# Patient Record
Sex: Male | Born: 1960
Health system: Southern US, Community
[De-identification: ages and names within clinical notes are randomized; demographics above are authoritative.]

## PROBLEM LIST (undated history)

## (undated) DIAGNOSIS — I2510 Atherosclerotic heart disease of native coronary artery without angina pectoris: Secondary | ICD-10-CM

## (undated) DIAGNOSIS — E785 Hyperlipidemia, unspecified: Secondary | ICD-10-CM

## (undated) DIAGNOSIS — I10 Essential (primary) hypertension: Secondary | ICD-10-CM

## (undated) DIAGNOSIS — I4891 Unspecified atrial fibrillation: Secondary | ICD-10-CM

## (undated) DIAGNOSIS — T7840XA Allergy, unspecified, initial encounter: Secondary | ICD-10-CM

## (undated) DIAGNOSIS — N189 Chronic kidney disease, unspecified: Secondary | ICD-10-CM

## (undated) DIAGNOSIS — I219 Acute myocardial infarction, unspecified: Secondary | ICD-10-CM

## (undated) DIAGNOSIS — E119 Type 2 diabetes mellitus without complications: Secondary | ICD-10-CM

## (undated) HISTORY — DX: Chronic kidney disease, unspecified: N18.9

## (undated) HISTORY — PX: SHOULDER SURGERY: SHX246

## (undated) HISTORY — DX: Type 2 diabetes mellitus without complications: E11.9

## (undated) HISTORY — DX: Essential (primary) hypertension: I10

## (undated) HISTORY — DX: Hyperlipidemia, unspecified: E78.5

## (undated) HISTORY — DX: Unspecified atrial fibrillation: I48.91

## (undated) HISTORY — PX: KNEE ARTHROSCOPY: SUR90

## (undated) HISTORY — DX: Acute myocardial infarction, unspecified: I21.9

## (undated) HISTORY — DX: Atherosclerotic heart disease of native coronary artery without angina pectoris: I25.10

## (undated) HISTORY — PX: CORONARY STENT PLACEMENT: SHX1402

## (undated) HISTORY — DX: Allergy, unspecified, initial encounter: T78.40XA

---

## 1998-01-31 ENCOUNTER — Emergency Department (HOSPITAL_COMMUNITY): Admission: EM | Admit: 1998-01-31 | Discharge: 1998-01-31 | Payer: Self-pay | Admitting: Emergency Medicine

## 2000-03-09 ENCOUNTER — Encounter: Payer: Self-pay | Admitting: Gastroenterology

## 2000-03-09 ENCOUNTER — Ambulatory Visit (HOSPITAL_COMMUNITY): Admission: RE | Admit: 2000-03-09 | Discharge: 2000-03-09 | Payer: Self-pay | Admitting: Gastroenterology

## 2002-08-15 ENCOUNTER — Ambulatory Visit (HOSPITAL_COMMUNITY): Admission: RE | Admit: 2002-08-15 | Discharge: 2002-08-15 | Payer: Self-pay | Admitting: Orthopedic Surgery

## 2002-08-15 ENCOUNTER — Encounter: Payer: Self-pay | Admitting: Orthopedic Surgery

## 2003-05-10 HISTORY — PX: CARDIAC SURGERY: SHX584

## 2004-02-12 ENCOUNTER — Inpatient Hospital Stay (HOSPITAL_COMMUNITY): Admission: EM | Admit: 2004-02-12 | Discharge: 2004-02-15 | Payer: Self-pay | Admitting: Emergency Medicine

## 2004-03-01 ENCOUNTER — Encounter (HOSPITAL_COMMUNITY): Admission: RE | Admit: 2004-03-01 | Discharge: 2004-05-30 | Payer: Self-pay | Admitting: Cardiology

## 2004-03-19 ENCOUNTER — Ambulatory Visit: Payer: Self-pay | Admitting: Internal Medicine

## 2004-03-24 ENCOUNTER — Ambulatory Visit (HOSPITAL_COMMUNITY): Admission: RE | Admit: 2004-03-24 | Discharge: 2004-03-24 | Payer: Self-pay | Admitting: Orthopedic Surgery

## 2004-04-06 ENCOUNTER — Ambulatory Visit: Payer: Self-pay | Admitting: *Deleted

## 2004-04-26 ENCOUNTER — Ambulatory Visit: Payer: Self-pay | Admitting: Internal Medicine

## 2004-05-31 ENCOUNTER — Encounter (HOSPITAL_COMMUNITY): Admission: RE | Admit: 2004-05-31 | Discharge: 2004-08-29 | Payer: Self-pay | Admitting: Cardiology

## 2004-06-25 ENCOUNTER — Ambulatory Visit: Payer: Self-pay | Admitting: Internal Medicine

## 2004-06-29 ENCOUNTER — Ambulatory Visit: Payer: Self-pay | Admitting: Internal Medicine

## 2004-06-29 ENCOUNTER — Inpatient Hospital Stay (HOSPITAL_COMMUNITY): Admission: EM | Admit: 2004-06-29 | Discharge: 2004-07-01 | Payer: Self-pay | Admitting: Emergency Medicine

## 2004-06-29 ENCOUNTER — Ambulatory Visit: Payer: Self-pay | Admitting: Cardiology

## 2005-09-15 ENCOUNTER — Ambulatory Visit: Payer: Self-pay | Admitting: Internal Medicine

## 2006-02-13 ENCOUNTER — Encounter: Admission: RE | Admit: 2006-02-13 | Discharge: 2006-02-13 | Payer: Self-pay | Admitting: Cardiovascular Disease

## 2006-02-16 ENCOUNTER — Ambulatory Visit (HOSPITAL_COMMUNITY): Admission: RE | Admit: 2006-02-16 | Discharge: 2006-02-16 | Payer: Self-pay | Admitting: Cardiovascular Disease

## 2006-02-27 ENCOUNTER — Ambulatory Visit: Payer: Self-pay | Admitting: Internal Medicine

## 2006-04-14 ENCOUNTER — Ambulatory Visit: Payer: Self-pay | Admitting: Internal Medicine

## 2006-06-28 ENCOUNTER — Ambulatory Visit: Payer: Self-pay | Admitting: Family Medicine

## 2007-02-09 ENCOUNTER — Telehealth: Payer: Self-pay | Admitting: Internal Medicine

## 2007-03-05 ENCOUNTER — Encounter: Payer: Self-pay | Admitting: Internal Medicine

## 2007-03-08 ENCOUNTER — Encounter: Payer: Self-pay | Admitting: Internal Medicine

## 2008-03-03 ENCOUNTER — Encounter: Payer: Self-pay | Admitting: Internal Medicine

## 2008-03-26 ENCOUNTER — Encounter: Payer: Self-pay | Admitting: Internal Medicine

## 2009-02-05 ENCOUNTER — Telehealth: Payer: Self-pay | Admitting: Internal Medicine

## 2009-02-06 ENCOUNTER — Ambulatory Visit: Payer: Self-pay | Admitting: Family Medicine

## 2009-02-06 DIAGNOSIS — R1084 Generalized abdominal pain: Secondary | ICD-10-CM | POA: Insufficient documentation

## 2009-02-06 DIAGNOSIS — I2510 Atherosclerotic heart disease of native coronary artery without angina pectoris: Secondary | ICD-10-CM | POA: Insufficient documentation

## 2009-02-13 ENCOUNTER — Encounter: Admission: RE | Admit: 2009-02-13 | Discharge: 2009-02-13 | Payer: Self-pay | Admitting: Family Medicine

## 2009-07-23 ENCOUNTER — Telehealth: Payer: Self-pay | Admitting: Internal Medicine

## 2009-08-26 ENCOUNTER — Encounter: Payer: Self-pay | Admitting: Internal Medicine

## 2009-10-27 ENCOUNTER — Encounter: Payer: Self-pay | Admitting: Internal Medicine

## 2010-06-08 NOTE — Letter (Signed)
Summary: [facility]  [facility]   Imported By: Maryln Gottron 11/10/2009 10:37:41  _____________________________________________________________________  External Attachment:    Type:   Image     Comment:   External Document

## 2010-06-08 NOTE — Progress Notes (Signed)
Summary: Pt req labs needed for Sarcoidosis  Phone Note Call from Patient Call back at Home Phone [phone]   Caller: spouse-Ellen Reason for Call: Acute Illness Summary of Call: Pts wife called and said that pt has Sarcoidosis and hasnt had labs done in a long time. Pt would like Dr. Lovell Sheehan to order labs.  Initial call taken by: Lucy Antigua,  July 23, 2009 12:49 PM  Follow-up for Phone Call        needs ov a nd he will decide what to order then Follow-up by: Willy Eddy, LPN,  July 23, 2009 1:20 PM  Additional Follow-up for Phone Call Additional follow up Details #1::        Phone Call Completed---Contacted pts wife and appt was made for 08/20/2009 for OV w/ Dr Lovell Sheehan (Okay per Dr Lovell Sheehan).Marland KitchenMarland KitchenPt will come in fasting for potential labwrk. Additional Follow-up by: Debbra Riding,  July 23, 2009 2:13 PM

## 2010-09-24 NOTE — Op Note (Signed)
NAME:  Michael Livingston, Michael [name]:  [id number]   MEDICAL RECORD NO.:  [id number]          PATIENT TYPE:  AMB   LOCATION:  DAY                          FACILITY:  [facility]   PHYSICIAN:  Ollen Gross, M.D.    DATE OF BIRTH:  [birth year]   DATE OF PROCEDURE:  03/24/2004  DATE OF DISCHARGE:                                 OPERATIVE REPORT   PREOPERATIVE DIAGNOSIS:  Right knee medial meniscal tear.   POSTOPERATIVE DIAGNOSIS:  Right knee medial meniscal tear.   PROCEDURE:  Right knee arthroscopy with meniscal debridement.   SURGEON:  Gus Rankin. Aluisio, M.D.   ANESTHESIA:  Local with MAC.   ESTIMATED BLOOD LOSS:  Minimal.   DRAIN:  None.   COMPLICATIONS:  None.   CONDITION:  Stable to recovery.   BRIEF CLINICAL NOTE:  [name] is a 50 year old male, who sustained an on-the-  job injury to his right knee back in September.  He has sustained a medial  meniscal tear and has continued pain and mechanical symptoms.  He presents  now for arthroscopy and debridement.   PROCEDURE IN DETAIL:  After the successful administration of local with MAC  anesthetic, a tourniquet is placed high on the right thigh and right lower  extremity prepped and draped in the usual sterile fashion.  I then injected  the portal sites with 1% Xylocaine with epinephrine, approximately 15 mL  total.  An incision is made for the superomedial and inferolateral portals  and inflow cannula passed superomedial and camera passed inferolateral.  Arthroscopic visualization proceeds.   The undersurface of the patella and the trochlea both look normal.  Medial  and lateral gutters are visualized, and there are no loose bodies.  Flexion  and valgus force is applied to the knee, and the medial compartment is  entered.  Chondral surface looks normal, but he has sustained a bucket-  handle tear as well as a vertical tear of the medial meniscus.  The  posterior horn is essentially shredded all the way over to the body.   This  is not degenerative in nature and is more a traumatic-type tear.  Spinal  needle was used to localize the inferomedial portal, small incision made,  dilator placed, and then the baskets and shaver are used to debride the tear  back to a stable base.  The chondral surfaces remained unaffected.  Intercondylar notch is visualized.  ACL appears and probes normally.  The  lateral compartment is entered, and that is normal.  We then removed the  arthroscopic equipment from the inferior portals  which are closed with interrupted 4-0 nylon.  Then 20 mL of 0.25% Marcaine  with epinephrine are injected through the inflow cannula.  Then that cannula  was removed and that portal closed with nylon.  A bulky sterile dressing is  applied, and he is awakened and transported to recovery in stable condition.     Drenda Freeze   FA/MEDQ  D:  03/24/2004  T:  03/24/2004  Job:  [id number]

## 2010-09-24 NOTE — Discharge Summary (Signed)
[name], KENTNER NO.:  [id number]   MEDICAL RECORD NO.:  [id number]          PATIENT TYPE:  INP   LOCATION:  4737                         FACILITY:  [facility]   PHYSICIAN:  [place] Bing, M.D.  DATE OF BIRTH:  [birth year]   DATE OF ADMISSION:  02/12/2004  DATE OF DISCHARGE:  02/15/2004                           DISCHARGE SUMMARY - REFERRING   HISTORY OF PRESENT ILLNESS:  [name] is a 50 year old white male who was  referred for exertional chest discomfort and shortness of breath with  radiation to the neck and shoulder. An Adenosine Cardiolite was  performed  on February 12, 2004 and showed septal apical ischemia and he was felt to be  high risk. Thus, he was admitted for cardiac catheterization. His history is  notable for remote tobacco use, 10 pack year history, history of  sarcoidosis, status post lung biopsy, finger surgery, left knee surgery. He  is currently undergoing evaluation for further knee surgery by Dr. Lequita Halt.   LABORATORY DATA:  Chest x-ray performed on February 12, 2004 did not show any  active disease.   Admission hemoglobin and hematocrit was 16.0 and 44.8, normal indices.  Platelets 266,000. White blood cell count 7.4. Prior to discharge,  hemoglobin and hematocrit was 15.3 and 42.9. Platelets 269,000. White blood  cell count 9.0. Admission PT was 12.3. PTT 29, sodium 135, potassium 4.1,  BUN 12, creatinine 0.9, glucose 84, normal liver function studies. Last  chemistry was on February 14, 2004 which was 134, potassium 3.9, BUN 8,  creatinine 0.9. Initial CK total MB's were negative x2. Post catheterization  total CK was 224 with an MB of 29.8 and a relative index of 13.3. On the  morning of the 9th, total CK was 154 with MB of 12.3, relative index 8.0,  and a troponin of 2.78. TSH was 1.676 on admission. Fasting lipids on the  8th showed a total cholesterol of 149, triglycerides 167, HDL 32, LDL 84.   EKG on the 6th showed normal sinus  rhythm, normal axis, non-specific STT  wave changes. Subsequent EKG's remained the same. However post  catheterization, he developed T wave inversion V2 through V6.   HOSPITAL COURSE:  Mr. Yamada was admitted to 3700. He was placed on  intravenous heparin as well as aspirin and a beta blocker and a statin.  Catheterization was performed by Dr. Juanda Chance. This revealed a proximal 95/80%  left anterior descending lesion and a 40% mid circumflex lesion. Ejection  fraction was 60%. Utilizing 2 Monorail stents, his left anterior descending  lesions were reduced to less than 0 percent. During the procedure, he did  have some chest discomfort. Dr. Juanda Chance noted that his enzyme bump is  probably transient from the left anterior descending intervention. He felt  that if the MB was greater than 2 times normal, we should keep the patient  until Sunday. If less than 2 times normal, probably okay to discharge  Saturday. He described that the patient should be on Plavix for 4 weeks and  then could be discontinued for knee surgery and then resumed post knee  surgery. He will followup with Dr. Tenny Craw in 2 weeks. Dr. Juanda Chance recommended a  followup Cardiolite in approximately 6 months. By February 14, 2004, Dr.  Antoine Poche reviewed and with the patient's enzyme bump, he felt that he should  remain until Sunday. During this interval, he was seen by cardiac  rehabilitation who assisted with ambulation and education. By February 15, 2004 after Dr. Marvel Plan review, it was felt that the patient could be  discharged home.   DISCHARGE DIAGNOSES:  1.  Unstable angina with positive Adenosine Cardiolite. Catheterization as      previously described. Status post Monorail stenting x2 to the proximal      left anterior descending. Post catheterization CK, MB, and troponin      release.  2.  Hyperlipidemia.  3.  Remote tobacco history previously.   DISPOSITION:  He is discharged home.   DISCHARGE MEDICATIONS:  He received  prescriptions for new medications in  regards to:  1.  Aspirin 325 mg p.o. q.d.  2.  Toprol XL 50 mg q.d.  3.  Lipitor 80 mg q.h.s.  4.  Plavix 75 mg q.d. for 4 weeks. After 4 weeks of Plavix, he was asked to      hold for 1 week, have his knee surgery, and then resume his Plavix post      surgery.  5.  Nitroglycerin 0.4 mg as needed.  6.  He was instructed that he may continue is Protonix 40 mg q.d.   ACTIVITY:  He was advised no driving for 2 days or strenuous activities for  2 weeks.   DIET:  Low-salt, low-fat, low-cholesterol diet.   SPECIAL INSTRUCTIONS:  If he has any problems with the catheterization site,  he was asked to call.   FOLLOW UP:  With Dr. Lovell Sheehan as needed as well as Dr. Lequita Halt. He will see  Dr. Alexia Freestone. on February 27, 2004 at noon. He will need fasting lipids and  liver function studies in approximately 6 to 8 weeks, since Lipitor was  initiated. At the time of followup, cardiac rehabilitation also needs to be  arranged for the patient.      Emil   EW/MEDQ  D:  02/15/2004  T:  02/15/2004  Job:  [id number]   cc:   Pricilla Riffle, M.D.   Dalia Heading, M.D.  [address] [place]  [place]  [place] [address]  Fax: [phone]   Ollen Gross, M.D.  [facility]  [address]  [address]  [place]  [place] [address]  Fax: [phone]

## 2010-09-24 NOTE — Assessment & Plan Note (Signed)
[facility]                                 ON-CALL NOTE   CHRISTOFFER, CURRIER                        MRN:          [id number]  DATE:06/25/2006                            DOB:          Aug 22, 1960    PHONE:  [phone]   PRIMARY CARE PHYSICIAN:  Dr. Lovell Sheehan.   SUBJECTIVE:  The patient has been having a fever of 101 to 103 since  Friday evening.  He has sudden onset of body aches, cough, congestion,  runny nose and headache.  He has been using Tylenol and ibuprofen for  fever.  He has a history of a heart stent and sarcoid.  He is concerned  about how best to manage the fever.  He temperature now is 100.8.   ASSESSMENT AND PLAN:  His description of symptoms sounds very much like  influenza.  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  I discussed continual use of ibuprofen 400 mg q.4h p.r.n. fever, as well  as keeping well hydrated and resting.  I will call in a prescription for  Tamiflu 75 mg p.o. b.i.d. x5 days, zero refills.  The patient states  that if he does not feel better he will follow up with his primary care  doctor.  In addition, he was told that if he has shortness of breath to  go to the emergency room.     Kerby Nora, MD  Electronically Signed    AB/MedQ  DD: 06/25/2006  DT: 06/25/2006  Job #: [id number]

## 2010-09-24 NOTE — Discharge Summary (Signed)
NAME:  Michael Livingston, Michael [name]:  [id number]   MEDICAL RECORD NO.:  [id number]            PATIENT TYPE:   LOCATION:                                 FACILITY:   PHYSICIAN:  Rene Paci, M.D. [facility]  DATE OF BIRTH:   DATE OF ADMISSION:  06/29/2004  DATE OF DISCHARGE:                                 DISCHARGE SUMMARY   DISCHARGE DIAGNOSES:  1.  Chest pain, atypical, ruled out myocardial infarction, enzymes negative,      catheterization negative.  2.  History of coronary artery disease.  3.  History of sarcoidosis.  4.  History of dyslipidemia.   DISCHARGE MEDICATIONS:  As prior to admission without change and include:   1.  Vytorin 10/40 mg.  2.  Aspirin.  3.  Plavix 75 mg.  4.  Toprol XL 25 mg daily.   CONSULTATIONS:  [facility].   PROCEDURES:  Cardiac catheterization February 22, with 30% blockage  secondary to stent pinching, no change from October 2005.  Normal LVEF.   CONDITION ON DISCHARGE:  Medically stable.   DISPOSITION:  Home.   HOSPITAL COURSE BY PROBLEM:  The patient is a 50 year old gentleman with  previous history of coronary disease and stents, who presents to the  emergency room the day of admission with complaints of chest pain and no  other atypical features.  He was seen in consultation by cardiology.  Cardiac enzymes were negative.  It was felt that he deserved a  catheterization due to the possibility of restenosis.  No recurrent chest  pain, and the catheterization was performed February 22 without abnormality.  He was told to continue previous medications with stress on compliance and  no other recommendations at this time.      Rene Paci, M.D. [facility]  Electronically Signed     VL/MEDQ  D:  03/01/2005  T:  03/02/2005  Job:  [id number]

## 2010-09-24 NOTE — H&P (Signed)
Michael Livingston, [name]:  [id number]   MEDICAL RECORD NO.:  [id number]          PATIENT TYPE:  INP   LOCATION:  3730                         FACILITY:  [facility]   PHYSICIAN:  Pricilla Riffle, M.D.    DATE OF BIRTH:  1960/08/26   DATE OF ADMISSION:  02/12/2004  DATE OF DISCHARGE:                                HISTORY & PHYSICAL   PRIMARY CARE PHYSICIAN:  Dr. Lovell Sheehan.   CHIEF COMPLAINT:  Chest discomfort; abnormal Cardiolite.   HISTORY OF PRESENT ILLNESS:  Michael Livingston is a very pleasant 50 year old  married white male with no known history of coronary artery disease who  presents to the office today for follow up after undergoing Cardiolite study  this morning. His Cardiolite returned showing septal and apical ischemia and  was read on the high risk study by Dr. Jens Som. The patient notes a history  of exertional chest discomfort over the past two months. With just minimal  exertional he will have a burning sensation in his chest that goes up into  his neck and across his shoulders. He notes significant shortness of breath  with just minimal exertion. He has had some presyncope, but denies any  syncope. He denies any paroxysmal nocturnal dyspnea or orthopnea. He denies  any nausea or diaphoresis. He has had some symptoms at rest from time to  time. We plan to admit him directly to [facility] today and set him  up for cardiac catheterization tomorrow.   PAST MEDICAL HISTORY:  Significant for sarcoidosis and he is status post  lung biopsy. He also had cholesterol checked at a screening fair recently  that showed a total cholesterol of 200, triglycerides in the 500 range.  However, the patient had eaten about four hours prior to this. He denies any  history of diabetes mellitus, hypertension, or thyroid disease. He has a  history of left knee surgery and a history of finger surgery.   MEDICATIONS:  He is currently on Protonix daily p.r.n. This was given to  him  by his primary care physician recently for the above symptoms.  Of note he  has had no relief with this.   ALLERGIES:  SULFA and ALEVE.   SOCIAL HISTORY:  The patient lives in [place]. 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 He  quit smoking in 1989 after 10 years. He denies any alcohol or drug abuse.   FAMILY HISTORY:  Significant for coronary artery disease in his maternal  grandfather. He had a myocardial infarction in his 21s. His paternal  grandfather has some type of heart trouble and his grandmother had valvular  disease.   REVIEW OF SYSTEMS:  Please see the HPI. Denies any melena, hematochezia,  hematuria, dysuria, fever, chills, or cough. He does have significant right  knee pain. He tore his meniscus recently and is awaiting clearance for  surgery.   PHYSICAL EXAMINATION:  GENERAL: He is well-developed, well-nourished, and in  no acute distress.  VITAL SIGNS: Blood pressure is 130/74, pulse 65, respirations 15, weight 230  pounds.  HEENT: Head is normocephalic and atraumatic. PERRLA. EOMI. Sclerae  clear.  NECK: Supple without lymphadenopathy, thyromegaly, bruits, or JVD. No  lymphadenopathy.  CARDIAC: Normal S1 and S2. Regular rate and rhythm without murmurs.  LUNGS: Clear to auscultation bilaterally.  ABDOMEN: Soft, nontender with normoactive bowel sounds. No  hepatosplenomegaly.  EXTREMITIES: Without clubbing, cyanosis, or edema.  MUSCULOSKELETAL: Without joint deformity.  NEUROLOGIC: Alert and oriented times three. Nonfocal.  VASCULAR: No carotid bruits bilaterally. Femoral pulses are 2+ bilaterally  without bruits.   Electrocardiogram reveals a sinus rhythm with heart rate of 61, nonspecific  ST-T wave changes.   IMPRESSION:  1.  Unstable angina pectoris with markedly abnormal Cardiolite as noted      above.  2.  Ex-smoker.  3.  Remote family history of coronary artery disease.  4.  History of sarcoidosis.   PLAN:  The patient is also interviewed and examined by Dr.  Tenny Craw today. We  plan to admit him directly to [facility] tonight and place him on  IV heparin, aspirin, Lopressor 12.5 mg b.i.d., and Lipitor 80 mg daily. If  he has recurrent pain tonight will place him on intravenous nitroglycerin.  Will plan cardiac catheterization tomorrow. The risks and benefits have been  discussed with the patient and his wife and they both agree to proceed.       SW/MEDQ  D:  02/12/2004  T:  02/12/2004  Job:  [id number]   cc:   Stacie Glaze, M.D. [facility]

## 2010-09-24 NOTE — Cardiovascular Report (Signed)
NAME:  Michael Livingston, Michael [name]:  [id number]   MEDICAL RECORD NO.:  [id number]          PATIENT TYPE:  INP   LOCATION:  4732                         FACILITY:  [facility]   PHYSICIAN:  Jonelle Sidle, M.D. LHCDATE OF BIRTH:  [birth year]   DATE OF PROCEDURE:  DATE OF DISCHARGE:                              CARDIAC CATHETERIZATION   PRIMARY CARE PHYSICIAN:  Dr. Darryll Capers.   CARDIOLOGIST:  Dr. Dietrich Pates.   INDICATION:  Michael Livingston is a 50 year old male with a history of coronary  artery disease status post Tandem Bare Metal stent placement to the proximal  and mid left anterior descending by Dr. Juanda Chance back in October 2005.  He has  been maintained on Plavix since that time except for a brief perioperative  period during knee surgery in November.  He is now admitted to the hospital  with complaints of shortness of breath that have been more noted over the  last few weeks, as well as recent brief episode of chest pain somewhat  reminiscent of the pain he experienced on the cardiac catheterization table  during intervention.  In reviewing the patients cardiac catheterization  report, he did also have some spasm noted at that time in the left anterior  descending.  He referred now for repeat coronary angiography to assess  predominantly stent patency and rule out presence of new obstructive  atherosclerosis.   PROCEDURES PERFORMED:  1.  Left heart catheterization.  2.  Selective coronary angiography.  3.  Left ventriculography.   ACCESS AND EQUIPMENT:  The area about the right femoral artery was  anesthetized with 1% lidocaine and a 6 French sheath was placed in the right  femoral artery via the modified Seldinger technique.  Standard preformed 6  [place] and JR4 catheters were used for selective coronary angiography  and an angled pigtail catheter was used for left heart catheterization and  left ventriculography.  All exchanges were made over a wire and the  patient  tolerated the procedure well without immediate complications.   HEMODYNAMICS:  Left ventricle 131/41 mmHg, aorta 126/77 mmHg.   ANGIOGRAPHIC FINDINGS:  1.  Left main coronary artery is free of significant flow limiting coronary      atherosclerosis.  2.  The left anterior descending is a medium caliber vessel.  Overlapping      stents are noted in the proximal to mid vessel segment.  These stents      are patent with approximately 30% stenosis at the proximal portion of      the stented site.  The first diagonal and septal perforator are      pinched at their ostium most likely secondary to stent placement, but      these areas are stable compared to post intervention images from October      2005.  Flow is TIMI-3 in the vessel.  3.  The circumflex coronary artery is large and dominant with a 40% mid      vessel stenosis that is stable compared to previous angiography films      from October 2005.  4.  The right coronary artery is a medium caliber nondominant vessel without      any significant flow limiting coronary atherosclerosis.   Left ventriculography was performed in the RAO projection and revealed an  ejection fraction of approximately 65% with no significant mitral  regurgitation or wall motion abnormalities.   DIAGNOSES:  1.  Mild coronary atherosclerosis including a stable 40% mid circumflex      stenosis, and a patent proximal to mid left anterior descending stent      sites.  There is 20-30% end-stent restenosis noted proximally, although      the vessel does taper in this area in fairly diffuse fashion.  The      appearance of the ostia of the first diagonal and septal perforators      crossed by the stents are stable compared to previous angiography films      from October 2005.  2.  Left ventricular ejection fraction of approximately 65% with no mitral      regurgitation, no wall motion abnormalities, and normal left ventricular      end-diastolic pressure  of 14 mmHg.   RECOMMENDATIONS:  At this point we will continue aggressive risk factor  modification and cardiology followup.      SGM/MEDQ  D:  06/30/2004  T:  06/30/2004  Job:  [id number]   cc:   Stacie Glaze, M.D. [facility]   Pricilla Riffle, M.D.

## 2010-09-24 NOTE — Cardiovascular Report (Signed)
Michael Livingston, EADS NO.:  [id number]   MEDICAL RECORD NO.:  [id number]          PATIENT TYPE:  INP   LOCATION:  2905                         FACILITY:  [facility]   PHYSICIAN:  Charlies Constable, M.D. [facility] DATE OF BIRTH:  March 11, 1961   DATE OF PROCEDURE:  02/13/2004  DATE OF DISCHARGE:                              CARDIAC CATHETERIZATION   CLINICAL HISTORY:  Michael Livingston is 50 years old and has no prior history of  known heart disease.  He also has had no history of diabetes or  hypertension.  He is having severe problems with his knee and will require  knee surgery in the next several weeks.  He works for [employer].  Dr.  Lovell Sheehan ordered a Cardiolite scan.  This was markedly positive for anterior  ischemia, so he was seen in the office yesterday by Dr. Lucina Mellow and admitted to  the hospital for catheterization today.   PROCEDURE:  The procedure was performed via the right femoral artery using  arterial sheath and 6 French preformed coronary catheters. A front wall  arterial puncture was performed and Omnipaque contrast was used.  After  completion of the diagnostic study, made decision to proceed with  intervention on the complex LAD lesion which extended across a diagonal  branch.  The ostium of the diagonal branch had only minimal disease, but  there was marked plaque in the LAD adjacent to the diagonal branch.  For  this reason, we felt the best way to spare the diagonal branch was to  perform directional atherectomy of the LAD.  The patient had a strong need  to have knee surgery so he can back to work in the near future, so we  elected to put bare metal stents in rather than drug-eluting stents.   The patient was given Angiomax bolus and infusion was given 300 mg of  Plavix.  We used a JL-4, 8 [place] Guidant guiding catheter and the [employer]  We crossed the lesion in the proximal LAD without too much  difficulty.  We initially predilated with a 2.0 x  20-mm Voyager balloon  performing one inflation of 8 atmospheres for 30 seconds.  We then passed a  3.0/3.4 cutter to the proximal LAD at the level of the diagonal branch and  performed five cuts and 5-10 PSI.  The cutter was then removed and the  patient developed slow flow down the LAD with chest pain, so we elected to  remove the cutter entirely and go back in with the balloon.  By the time we  were back in with the balloon he had completely occluded LAD and we had  difficulty re-establishing flow.  We performed multiple inflations with a 2-  0 balloon and this was unsuccessful.  We then used a 2.5 balloon and still  unable to re-establish flow.  We then put a 2.5 x 20-mm perfusion balloon  down the vessel and we were able to document that there was distal flow.  Out thinking was that he probably had a dissection extending past the  initial lesion and causing obstruction  and also had intense spasm.  We then  elected to stent the proximal LAD.  We used a 3.0 x 32-mm Liberte and  deployed this with one inflation of 12 atmospheres for 30 seconds.  There  was residual disease distal to the distal edge of the stent although the  first stent did establish good distal flow.  For this reason, we placed a  second 3.0 x 60-mm Liberte stent just overlapping the first stent and distal  to the first stent and deployed this with one inflation of 12 atmospheres  for 30 seconds.  We  post dilated both stents with a 3.25 x 20-mm Quantum  Maverick performing three inflations up to 16 atmospheres for 30 seconds.  There was some residual what appeared to be spasm in the distal vessel that  did not respond to injections of nitroglycerin and Verapamil through the  guiding catheter, so we passed the 2.0 over the wire balloon and removed the  wire and performed direct injections of nitroglycerin and Verapamil.  We  also dilated this area with 2.0 balloon with one inflation of 8 atmospheres  for 30 seconds.   With these measures, the area improved and he had brisk  flow distally although there still was some moderate narrowing in this  region.  It appeared smooth like spasm although we had some concern there  might be a dissecting hematoma.  The final shots looked much better and we  felt that it was most likely related to spasm.   RESULTS:  Left main coronary artery:  The left main coronary was free of  significant disease.   Left anterior descending artery:  The left anterior descending artery gave  rise to two septal perforators and diagonal branch.  There was 95% stenosis  just at the first diagonal branch and there was a second 80% stenosis  downstream near the mid portion of the vessel.  Distal vessel appeared to be  free of major obstruction.   Circumflex artery:  The circumflex artery gave rise to a large ramus, small  marginal, posterior lateral and posterior descending branch.  There was 40%  narrowing in the mid vessel.  This was a dominant vessel.   Right coronary artery:  The right coronary artery was a nondominant vessel  that supplied two right ventricular branches.  These were free of  significant disease.   LEFT VENTRICULOGRAM:  The left ventriculogram performed in the RAO  projection showed good wall motion with no areas of hypokinesis.  The  estimated ejection fraction was 60%.   Following placement of tandem overlying stents in the proximal to mid, left  anterior descending, the 95 and 80% stenoses improved to 0% stenosis.  There  was residual 50% narrowing downstream.   CONCLUSIONS:  1.  Coronary artery disease with 95% proximal and 80% mid stenosis in the      left anterior descending artery, 40% narrowing in the mid circumflex      artery which was dominant, and no significant obstruction of the right      coronary artery with normal left ventricular function.  2.  Successful percutaneous coronary intervention of the lesion in the     proximal left anterior  descending using VCA and tandem overlying bare      metal stents with improvement in stent renarrowing from 95 and 80% to      0%.   DISPOSITION:  The patient was returned to the postangioplasty unit for  further observation.  He did have temporary occlusion of the LAD and he may  have had a small periprocedural myocardial infarction and will await enzymes  to see if this is the case.  He had some residual narrowing distally which  appears to be spasm, but it does not appear to be flow-limiting.  Will plan  an early stress test to make sure that this is not a problem.       BB/MEDQ  D:  02/13/2004  T:  02/13/2004  Job:  [id number]   cc:   Stacie Glaze, M.D. [facility]

## 2010-09-24 NOTE — H&P (Signed)
NAME:  Michael Livingston, Michael [name]:  [id number]   MEDICAL RECORD NO.:  [id number]          PATIENT TYPE:  INP   LOCATION:  3730                         FACILITY:  [facility]   PHYSICIAN:  Pricilla Riffle, M.D.    DATE OF BIRTH:  [birth year]   DATE OF ADMISSION:  02/12/2004  DATE OF DISCHARGE:                                HISTORY & PHYSICAL   ADDENDUM:  Upon further interview the patient does note a questionable history of  shrimp allergy. He can eat shrimp, but he does have an unusual feeling when  he eats it. To air on the safe side, we are going to give him Zantac 150 mg  p.o. tonight at bedtime and will also give him Benadryl IV on call at the  cath lab. Dr. Tenny Craw will be further in touch with the intervening doctor  tomorrow to see if he thinks preprocedure steroids should be utilized.       SW/MEDQ  D:  02/12/2004  T:  02/12/2004  Job:  [id number]   cc:   Stacie Glaze, M.D. [facility]

## 2010-09-24 NOTE — Consult Note (Signed)
NAME:  Michael Livingston, ASENCIO NO.:  [id number]   MEDICAL RECORD NO.:  [id number]          PATIENT TYPE:  INP   LOCATION:  1831                         FACILITY:  [facility]   PHYSICIAN:  Rollene Rotunda, M.D.   DATE OF BIRTH:  Mar 27, 1961   DATE OF CONSULTATION:  06/29/2004  DATE OF DISCHARGE:                                   CONSULTATION   PRIMARY CARDIOLOGIST:  Dr. Dietrich Pates.   PRIMARY CARE PHYSICIAN:  Dr. Darryll Capers.   PULMONARY:  Dr. Maple Hudson.   CHIEF COMPLAINT:  Chest pain.   We were asked to consult on this patient by Dr. Felicity Coyer. The patient has  known history of coronary artery disease. As of today, he was driving to  work this morning, experienced sudden onset of midsternal, sharp pressure  lasting for seconds. It went away, then it was followed by a hot flash. The  patient states his teeth and mouth felt very strange, almost like an ache.  He denied any dizziness or denied any nausea. He states his face felt very  funny. He pulled off the road and then decided to come to the hospital. He  presented to [facility] emergency room pain free. The patient states since  his cardiac catheterization - actually, the last few weeks - he has noticed  he has become more dyspneic on exertion. However, the patient has also  recently returned to work. Initially after catheterization he states he was  doing cardiac rehab without any problems. The last few weeks his energy  level has diminished, especially at work, although he does a lot of heavy  lifting at [employer].   ALLERGIES:  Questionable allergy to [facility] The patient states when he eats  shrimp his face feels very full and flushed. Also allergy to SULFA and  CODEINE makes him very sick on his stomach and ALEVE causes angioedema.   PAST MEDICAL HISTORY:  Cardiac catheterization in October 2005. Prior to  catheterization the patient did not experience any chest pain. He did,  however, experience a lot of dysphagia, burning in  his esophagus, and some  midsternal burning. What prompted him to have the catheterization, he needed  knee surgery and went for a stress test that turned out to be highly  abnormal, proceeded with cardiac catheterization by Dr. Juanda Chance in October  2005. At that time, the LAD was found to have 95% stenosis just at the first  diagonal branch and there was a second 80% stenosis downstream near the mid  portion of the vessel. The distal vessel appeared to be free of major  obstruction. The circumflex artery was also found to have a 40% narrowing in  the mid vessel.  EF at that time was estimated to be 60%. Good wall motion  with no areas of hypokinesis. The patient had successful stenting with bare  metal stents x2 to the LAD. However, the patient returned to the post  angioplasty unit for further observation. He did have a temporary occlusion  of the LAD and may have had a small periprocedural myocardial infarction at  that time. He  had some residual narrowing distally which appeared to be a  spasm but did not appear to be flow limiting. The plan was for the patient  to follow up in few months with a stress Cardiolite for reevaluation. This  was scheduled for this April at our office.   Other medical history includes dyslipidemia, sarcoidosis - the patient  states this was diagnosed by a chest x-ray for work. He followed up with Dr.  Maple Hudson, was placed on steroids for a year, and has had no further  complications. He states he does have some scarring to his lungs as a  result. Also, history of esophageal stricture. The patient states he had an  attempted dilatation at one time but that was not successful procedure due  to the patient's fighting the medical staff and unable to complete  procedure. Status post right knee surgery in November 2005.   MEDICATIONS:  1.  Toprol-XL 25 daily.  2.  Plavix 75 daily.  3.  Aspirin 81.  4.  Vytorin 10/40.  5.  Nitroglycerin p.r.n.   SOCIAL HISTORY:   The patient lives in [place] with his wife. He works for  [employer]. He has two children alive and well. He is an ex-tobacco user many years  ago. Denies any ETOH use. He does a lot of heavy lifting and walking for his  job with [employer]. He tries to follow a heart smart diet. He denies any drug or  herbal medication use. His mother is alive with type 2 diabetes and  hypertension. Father is alive with type 2 diabetes. He had bypass in his  early 15s. The patient's paternal grandmother deceased at age 50 due to  cardiac complications. The patient has two sisters alive and well.   REVIEW OF SYSTEMS:  Positive for chest pain, shortness of breath, dyspnea on  exertion, pain in his knees bilateral, dysphagia as stated above, and GERD  symptoms. All other systems negative.   PHYSICAL EXAMINATION:  VITAL SIGNS:  Temperature 97, pulse 68, respirations  18, blood pressure 140/80. He is saturating 99% on room air.  GENERAL:  He is alert and oriented, no acute distress, very pleasant and  cooperative.  NECK:  Supple without lymphadenopathy. Negative for TMJ, bruit, or JVD.  CARDIOVASCULAR:  Heart rate regular rate and rhythm, S1 and S2. Pulses 2+  and equal.  LUNGS:  Clear to auscultation bilateral.  ABDOMEN:  Soft, nontender, positive bowel sounds.  EXTREMITIES:  No clubbing, cyanosis, or edema.  BACK:  No spine or CVA tenderness.  NEUROLOGIC:  He is alert and oriented x3. Cranial nerves II-XII.   Chest x-ray pending. EKG showing a rate of 63, sinus rhythm, with a PR 175,  QRS 104, QTC 397.   LABORATORY WORK:  Initial lab work showing a white blood cell count of 5.5;  hemoglobin 15.8; hematocrit 44.8; platelets 234,000. Initial cardiac  enzymes:  Troponin less than 0.01. D-dimer less than 0.22.   Dr. Angelina Sheriff in to examine the patient. After examination of this patient, Dr. Antoine Poche spoke with the patient concerning the benefits of a  catheterization versus Cardiolite. Dr. Antoine Poche felt that at  this point a  cardiac catheterization would be the appropriate diagnostic measure, highly  suspicious for recurrent stenosis in overlapping bare metal stents. High  pretest probability of restenosis. The patient understands risks and  benefits and agrees with plan to proceed with cardiac catheterization  instead of a stress test.   IMPRESSION:  1.  Chest pain,  electrocardiogram without ischemic changes, enzymes pending.  2.  History of coronary artery disease status post catheterization with      stents x2 to left anterior descending coronary artery.  3.  History of sarcoidosis, stable; scarring in lungs per the patient.   We agree with the plan to admit the patient to telemetry, cycle cardiac  enzymes, continue his medications from home. Also, to dose the patient with  Lovenox 1 mg per kg, pharmacy to dose. We will not need to hold Lovenox  prior to cardiac catheterization per Dr. Antoine Poche. The patient is already on  aspirin, Plavix, and a beta blocker. We will also prophylactically treat the  patient for catheterization secondary to possible allergy to [facility] with  prednisone and Pepcid.      MB/MEDQ  D:  06/29/2004  T:  06/29/2004  Job:  [id number]

## 2010-09-24 NOTE — Cardiovascular Report (Signed)
NAME:  DEMORIO, [name]:  [id number]   MEDICAL RECORD NO.:  [id number]          PATIENT TYPE:  OIB   LOCATION:  2899                         FACILITY:  [facility]   PHYSICIAN:  Nicki Guadalajara, M.D.     DATE OF BIRTH:  [birth year]   DATE OF PROCEDURE:  02/16/2006  DATE OF DISCHARGE:                              CARDIAC CATHETERIZATION   INDICATIONS:  [name] is a pleasant 50 year old gentleman who has  known coronary artery disease status post stenting of proximal to mid LAD in  October 2005 by Dr. Juanda Chance.  At last catheterization was in February 2006,  he had patent stent sites but had 40% mid circumflex stenosis with mild 20-  30% narrowing in the stented segment.  He has a history of hyperlipidemia  and hypertension.  He works for [employer].  He has recently noticed increasing  shortness of breath with activity in carrying his loads.  He recently  underwent a Myoview study which raised the possibility of new subtle  ischemia distally.  However, it was also possible that this may be related  to attenuation artifact from pectoralis musculature.  However, with his job  requirements, young age and no risk factors, definitive repeat  catheterization was recommended.   PROCEDURE:  After premedication with Versed 2 mg intravenously, the patient  was prepped and draped in the usual fashion.  Right femoral artery was  punctured anteriorly and a 5-French sheath was inserted without difficulty.  Diagnostic catheterization was done with 5-French Judkins for left and right  coronary catheters.  The pigtail catheter was used for __________ .  Hemostasis was obtained by direct manual pressure.  The patient tolerated  the procedure well and returned to his room in satisfactory condition.   HEMODYNAMIC DATA:  Central aortic pressure 121/70, left ventricular pressure  121/70, post A-wave 13.   ANGIOGRAPHIC DATA:  The left main coronary was angiographically normal and  trifurcated into the left anterior descending artery, moderate size ramus  intermediate vessel and dominant left circumflex system.   The LAD gave rise to a long stented segment just after the septal  perforating artery.  There was no significant restenosis within the long  stented segment, but there was mild in-stent restenosis of approximately 20%  proximally and distally in the stented segment.  The remainder of the LAD  was free of significant disease.   The ramus intermediate vessel was a moderate-sized vessel that was  angiographically normal.   The circumflex vessel was a dominant vessel that gave rise to a proximal  small septal marginal vessel and ended in the PDA posterolateral vessel.  There was mild 30% narrowing at a site of previous 40% narrowing in the  proximal bend of the vessel which appeared to be stable and not  significantly changed.   The right coronary artery was a normal nondominant vessel.   Biplane __________  revealed normal LV contractility without focal segmental  wall motion abnormalities.   IMPRESSIONS:  1. Normal left ventricular function.  2. No significant restenosis at the prior stented segment in the left  anterior segment with residual in-stent smooth narrowing of less than      30%.  3. Smooth 30% narrowing in the proximal bend of the left circumflex      coronary artery not significantly changed      from the patient's prior study of February 2006.  4. Normal intermediate and normal nondominant right coronary artery.   RECOMMENDATIONS:  Medical therapy.           ______________________________  Nicki Guadalajara, M.D.     TK/MEDQ  D:  02/16/2006  T:  02/17/2006  Job:  [id number]   cc:   Nicki Guadalajara, M.D.  Stacie Glaze, MD

## 2012-05-14 ENCOUNTER — Telehealth: Payer: Self-pay | Admitting: Internal Medicine

## 2012-05-14 NOTE — Telephone Encounter (Signed)
Talked with Michael Livingston and she is going to ask for dr burchette

## 2012-05-14 NOTE — Telephone Encounter (Signed)
Patient's spouse called schedule her husband to see Dr. Lovell Sheehan as he has recently through a DOT physical been dx with Dm and he has not been seen since 2010 and would like to continue seeing Dr. Lovell Sheehan as his entire family has always seen him. Please advise.

## 2012-05-22 ENCOUNTER — Ambulatory Visit: Payer: Self-pay | Admitting: Family

## 2012-07-16 ENCOUNTER — Encounter: Payer: Self-pay | Admitting: Family Medicine

## 2012-07-16 ENCOUNTER — Ambulatory Visit (INDEPENDENT_AMBULATORY_CARE_PROVIDER_SITE_OTHER): Payer: BC Managed Care – PPO | Admitting: Family Medicine

## 2012-07-16 VITALS — BP 130/68 | HR 72 | Temp 98.6°F | Resp 12 | Ht 74.0 in | Wt 213.0 lb

## 2012-07-16 DIAGNOSIS — E785 Hyperlipidemia, unspecified: Secondary | ICD-10-CM | POA: Insufficient documentation

## 2012-07-16 DIAGNOSIS — E119 Type 2 diabetes mellitus without complications: Secondary | ICD-10-CM | POA: Insufficient documentation

## 2012-07-16 NOTE — Progress Notes (Signed)
  Subjective:    Patient ID: Michael Livingston, male    DOB: 07-01-1960, 52 y.o.   MRN: [id number]  HPI  Here to establish care. Patient has history of coronary disease. Reportedly had 2 heart stents in 2005 and has done well since then. Followed closely by cardiology. History of hyperlipidemia and hypertension which is treated by them Also his history of kidney stones previously  Recently diagnosed with type 2 diabetes through work screening. He had symptoms of thirst and urine frequency. Reportedly had fasting blood sugar of 175. Has home glucose monitor and recent fasting blood sugars mostly below 100 range. Symptoms have resolved. He has made dietary changes mostly with reducing sugar containing beverages. Works extremely long hours and very little time for exercise. He brings a log of blood sugars which were reviewed today.  Family history significant for father with hypertension and coronary heart disease. Both parents with type 2 diabetes.  Patient is married. Works as a Hospital doctor for [employer]. Quit smoking 1989. No alcohol use.  Past Medical History  Diagnosis Date  . Heart disease   . Hypertension   . Hyperlipidemia   . Chronic kidney disease    Past Surgical History  Procedure Laterality Date  . Knee arthroscopy      left 2003, right 2005  . Cardiac surgery  2005    stent    reports that he quit smoking about 25 years ago. He started smoking about 35 years ago. He does not have any smokeless tobacco history on file. His alcohol and drug histories are not on file. family history includes Diabetes in his father and mother and Heart disease in his father and paternal grandfather. Allergies  Allergen Reactions  . Aleve (Naproxen Sodium)     Swollen mouth  . Sulfa Antibiotics     hives     Review of Systems  Constitutional: Negative for fatigue and unexpected weight change.  Eyes: Negative for visual disturbance.  Respiratory: Negative for cough, chest tightness and shortness of  breath.   Cardiovascular: Negative for chest pain, palpitations and leg swelling.  Neurological: Negative for dizziness, syncope, weakness, light-headedness and headaches.       Objective:   Physical Exam  Constitutional: He appears well-developed and well-nourished.  Cardiovascular: Normal rate and regular rhythm.   No murmur heard. Pulmonary/Chest: Effort normal and breath sounds normal. No respiratory distress. He has no wheezes. He has no rales.  Musculoskeletal: He exhibits no edema.          Assessment & Plan:  Recently diagnosed type 2 diabetes. Reportedly had recent A1c 6.1% from another clinic. Fasting blood sugars are stable mostly low 100s. No clear indication for medications at this time. Discussed lifestyle management. Continue home monitoring. Recheck A1c in 3 months.  Discussed possible diabetes educator but this point he would like to monitor on his own History of CAD stable and followed by cardiology Hyperlipidemia followed by cardiology.

## 2012-07-16 NOTE — Patient Instructions (Addendum)

## 2012-10-18 ENCOUNTER — Other Ambulatory Visit: Payer: Self-pay | Admitting: *Deleted

## 2012-10-18 MED ORDER — OMEGA-3-ACID ETHYL ESTERS 1 G PO CAPS: 1.0000 g | ORAL_CAPSULE | Freq: Every day | ORAL | Status: DC

## 2012-10-18 NOTE — Telephone Encounter (Signed)
Refilled lovaza to pharmacy.

## 2012-10-19 ENCOUNTER — Other Ambulatory Visit: Payer: Self-pay | Admitting: *Deleted

## 2012-10-19 ENCOUNTER — Other Ambulatory Visit (INDEPENDENT_AMBULATORY_CARE_PROVIDER_SITE_OTHER): Payer: BC Managed Care – PPO

## 2012-10-19 LAB — HEMOGLOBIN A1C: Hgb A1c MFr Bld: 5.3 % (ref <5.7)

## 2012-10-19 NOTE — Progress Notes (Unsigned)
Patient came in with lab orders from Dr.Burchette

## 2012-10-20 ENCOUNTER — Encounter: Payer: Self-pay | Admitting: Family Medicine

## 2012-10-26 ENCOUNTER — Other Ambulatory Visit: Payer: Self-pay | Admitting: Pharmacist Clinician (PhC)/ Clinical Pharmacy Specialist

## 2012-10-26 MED ORDER — OMEGA-3-ACID ETHYL ESTERS 1 G PO CAPS: 1.0000 g | ORAL_CAPSULE | Freq: Every day | ORAL | Status: DC

## 2013-02-25 ENCOUNTER — Other Ambulatory Visit: Payer: Self-pay | Admitting: Cardiovascular Disease

## 2013-02-26 NOTE — Telephone Encounter (Signed)
Rx was sent to pharmacy electronically. 

## 2013-03-14 ENCOUNTER — Other Ambulatory Visit: Payer: Self-pay

## 2013-04-02 ENCOUNTER — Encounter: Payer: Self-pay | Admitting: Cardiovascular Disease

## 2013-04-02 ENCOUNTER — Ambulatory Visit (INDEPENDENT_AMBULATORY_CARE_PROVIDER_SITE_OTHER): Payer: BC Managed Care – PPO | Admitting: Cardiovascular Disease

## 2013-04-02 VITALS — BP 104/70 | HR 45 | Ht 75.0 in | Wt 208.6 lb

## 2013-04-02 DIAGNOSIS — E8881 Metabolic syndrome: Secondary | ICD-10-CM | POA: Insufficient documentation

## 2013-04-02 DIAGNOSIS — I10 Essential (primary) hypertension: Secondary | ICD-10-CM

## 2013-04-02 DIAGNOSIS — E1169 Type 2 diabetes mellitus with other specified complication: Secondary | ICD-10-CM | POA: Insufficient documentation

## 2013-04-02 DIAGNOSIS — R5381 Other malaise: Secondary | ICD-10-CM

## 2013-04-02 DIAGNOSIS — E119 Type 2 diabetes mellitus without complications: Secondary | ICD-10-CM

## 2013-04-02 DIAGNOSIS — E785 Hyperlipidemia, unspecified: Secondary | ICD-10-CM | POA: Insufficient documentation

## 2013-04-02 DIAGNOSIS — I2510 Atherosclerotic heart disease of native coronary artery without angina pectoris: Secondary | ICD-10-CM

## 2013-04-02 LAB — CBC
HCT: 42.6 % (ref 39.0–52.0)
Hemoglobin: 15.2 g/dL (ref 13.0–17.0)
MCH: 32.5 pg (ref 26.0–34.0)
MCHC: 35.7 g/dL (ref 30.0–36.0)
RDW: 12.6 % (ref 11.5–15.5)

## 2013-04-02 LAB — HEMOGLOBIN A1C
Hgb A1c MFr Bld: 5.7 % — ABNORMAL HIGH (ref <5.7)
Mean Plasma Glucose: 117 mg/dL — ABNORMAL HIGH (ref <117)

## 2013-04-02 LAB — NMR LIPOPROFILE WITH LIPIDS
Cholesterol, Total: 99 mg/dL (ref <200)
HDL Particle Number: 35.5 umol/L (ref >=30.5)
LDL Size: 19.7 nm — ABNORMAL LOW (ref >20.5)
Large VLDL-P: 1.6 nmol/L (ref <=2.7)
Small LDL Particle Number: 612 nmol/L — ABNORMAL HIGH (ref <=527)

## 2013-04-02 MED ORDER — METOPROLOL TARTRATE 25 MG PO TABS: 25.0000 mg | ORAL_TABLET | Freq: Two times a day (BID) | ORAL | Status: DC

## 2013-04-02 NOTE — Progress Notes (Signed)
Patient ID: [name], male   DOB: April 10, 1961, 52 y.o.   MRN: [id number]     HPI: Michael Livingston is a 52 y.o. male presents to the office for one year cardiology evaluation.  Michael Livingston is a 52 year old gentleman who has known coronary artery disease in October 2005 underwent stenting to his proximal to mid left anterior descending artery. His last catheterization was in October 2007 and his LAD was  was widely patent. He did have mild 30% circumflex stenosis. A nuclear perfusion study in January 2013 continued to show normal perfusion.  Michael Livingston has a history of palpitations which have been controlled with beta blocker therapy. He's also had a history of hyperlipidemia with an atherogenic lipid panel with low HDL levels. One year ago, an NMR lipoprofile did demonstrate increased insulin resistance and an increased wall LDL particles.  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 Since that time, he has lost over 20 pounds. He denies recent chest pain. He denies significant increase in palpitations except a rare episode. He does note significant fatigue. He also is concerned about his testosterone level contributing to this fatigability. He presents for evaluation  Past Medical History  Diagnosis Date  . Heart disease   . Hypertension   . Hyperlipidemia   . Chronic kidney disease   . Diabetes mellitus without complication     type 2    Past Surgical History  Procedure Laterality Date  . Knee arthroscopy      left 2003, right 2005  . Cardiac surgery  2005    stent    Allergies  Allergen Reactions  . Aleve [Naproxen Sodium]     Swollen mouth  . Sulfa Antibiotics     hives    Current Outpatient Prescriptions  Medication Sig Dispense Refill  . aspirin 81 MG tablet Take 81 mg by mouth daily.      Jennette Banker Sodium 30-100 MG CAPS Take by mouth daily. OTC      .  clopidogrel (PLAVIX) 75 MG tablet Take 75 mg by mouth daily.       . metoprolol (LOPRESSOR) 50 MG tablet TAKE 1 TABLET TWICE DAILY  60 tablet  0  . omega-3 acid ethyl esters (LOVAZA) 1 G capsule Take 1 capsule (1 g total) by mouth daily.  30 capsule  11  . ramipril (ALTACE) 5 MG capsule Take 5 mg by mouth daily.       Marland Kitchen VYTORIN 10-40 MG per tablet Take 1 tablet by mouth at bedtime.        No current facility-administered medications for this visit.    History   Social History  . Marital Status: Married    Spouse Name: N/A    Number of Children: N/A  . Years of Education: N/A   Occupational History  . Not on file.   Social History Main Topics  . Smoking status: Former Smoker -- 1.00 packs/day for 10 years    Start date: 07/16/1977    Quit date: 07/17/1987  . Smokeless tobacco: Not on file  . Alcohol Use: Not on file  . Drug Use: Not on file  . Sexual Activity: Not on file   Other Topics Concern  . Not on file   Social History Narrative  . No narrative on file    Family History  Problem Relation Age of Onset  . Diabetes Mother   .  Heart disease Father   . Diabetes Father   . Heart disease Paternal Grandfather    Social history is notable that he is married and has 2 children. There is no tobacco or alcohol use.  ROS is negative for fevers, chills or night sweats. He has purposely lost 20 pounds over the past year He denies skin rash. He denies change in hearing or vision. He denies cough or sputum production. Note a rare palpitation. He denies presyncope or syncope. There is no chest pain or anginal symptoms. He denies nausea vomiting or diarrhea. He denies blood in his stool or urine. He denies claudication. He denies myalgias. At times he notes rare paresthesias. Other comprehensive 12 point system review is negative.  PE BP 104/70  Pulse 45  Ht 6\' 3"  (1.905 m)  Wt 208 lb 9.6 oz (94.62 kg)  BMI 26.07 kg/m2  General: Alert, oriented, no distress.  Skin: normal  turgor, no rashes HEENT: Normocephalic, atraumatic. Pupils round and reactive; sclera anicteric;no lid lag.  Nose without nasal septal hypertrophy Mouth/Parynx benign; Mallinpatti scale 2 Neck: No JVD, no carotid briuts Lungs: clear to ausculatation and percussion; no wheezing or rales Heart: RRR, s1 s2 normal  Abdomen: soft, nontender; no hepatosplenomehaly, BS+; abdominal aorta nontender and not dilated by palpation. Pulses 2+ Extremities: no clubbing cyanosis or edema, Homan's sign negative  Neurologic: grossly nonfocal Psychologic: normal affect and mood.  ECG: Sinus bradycardia at 45 beats per minute. No significant ST-T change.  LABS:  BMET No results found for this basename: na, k, cl, co2, glucose, bun, creatinine, calcium, gfrnonaa, gfraa     Hepatic Function Panel  No results found for this basename: prot, albumin, ast, alt, alkphos, bilitot, bilidir, ibili     CBC No results found for this basename: wbc, rbc, hgb, hct, plt, mcv, mch, mchc, rdw, neutrabs, lymphsabs, monoabs, eosabs, basosabs     BNP No results found for this basename: probnp    Lipid Panel  No results found for this basename: chol, trig, hdl, cholhdl, vldl, ldlcalc     RADIOLOGY: No results found.    ASSESSMENT AND PLAN: My impression is that Michael Livingston is doing well from a cardiac standpoint. He is not having any anginal symptomatology and is now 9 years status post stenting to his mid LAD. He is bradycardic and has noted some fatigability. I recommend reduction of his Lopressor dose from 50 twice a day to 25 mg twice a day. This should help his fatigue. I commended him on his weight loss. He is fasting today. I am recommending that we obtain fasting laboratory consisting of a CBC comp CMP, TSH, hemoglobin A1c, NMR lipoprofile and will also check a testosterone level. Adjustments will be made if necessary to his medical regimen. We discussed insulin resistance and metabolic syndrome. I will  see him in one year for neurologic reassessment sooner if problems arise.     Lennette Bihari, MD, [facility]  04/02/2013 8:37 AM

## 2013-04-02 NOTE — Patient Instructions (Addendum)
Your physician has recommended you make the following change in your medication: Cut the metoprolol down from 50 twice daily to 25 mg twice daily.  Your physician recommends that you return for lab work fasting.  Your physician recommends that you schedule a follow-up appointment in: 1 YEAR. Jeannie Fend

## 2013-06-04 ENCOUNTER — Other Ambulatory Visit: Payer: Self-pay | Admitting: Cardiovascular Disease

## 2013-06-04 NOTE — Telephone Encounter (Signed)
Rx was sent to pharmacy electronically. 

## 2013-06-10 ENCOUNTER — Other Ambulatory Visit: Payer: Self-pay

## 2013-06-10 MED ORDER — RAMIPRIL 5 MG PO CAPS: 5.0000 mg | ORAL_CAPSULE | Freq: Every day | ORAL | Status: DC

## 2013-06-10 NOTE — Telephone Encounter (Signed)
Rx was sent to pharmacy electronically. 

## 2013-06-18 ENCOUNTER — Other Ambulatory Visit: Payer: Self-pay | Admitting: Family Medicine

## 2013-06-18 ENCOUNTER — Telehealth: Payer: Self-pay | Admitting: *Deleted

## 2013-06-18 MED ORDER — AZITHROMYCIN 250 MG PO TABS: ORAL_TABLET | ORAL | Status: DC

## 2013-06-18 NOTE — Telephone Encounter (Signed)
Pt aware med sent to [facility]

## 2013-06-18 NOTE — Telephone Encounter (Signed)
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

## 2013-06-18 NOTE — Progress Notes (Signed)
Medication has been picked up. Patient will notify us if symptoms do not improve.

## 2013-06-20 ENCOUNTER — Encounter: Payer: Self-pay | Admitting: Gastroenterology

## 2013-06-29 ENCOUNTER — Other Ambulatory Visit: Payer: Self-pay | Admitting: Cardiovascular Disease

## 2013-07-25 ENCOUNTER — Encounter: Payer: Self-pay | Admitting: Nurse Practitioner

## 2013-07-25 ENCOUNTER — Ambulatory Visit (INDEPENDENT_AMBULATORY_CARE_PROVIDER_SITE_OTHER): Payer: BC Managed Care – PPO | Admitting: Nurse Practitioner

## 2013-07-25 VITALS — BP 116/69 | HR 52 | Temp 97.1°F | Ht 75.0 in | Wt 220.0 lb

## 2013-07-25 DIAGNOSIS — J309 Allergic rhinitis, unspecified: Secondary | ICD-10-CM

## 2013-07-25 MED ORDER — TRIAMCINOLONE ACETONIDE 40 MG/ML IJ SUSP
60.0000 mg | Freq: Once | INTRAMUSCULAR | Status: AC
  Administered 2013-07-25: 60 mg via INTRAMUSCULAR

## 2013-07-25 NOTE — Patient Instructions (Signed)
Allergic Rhinitis Allergic rhinitis is when the mucous membranes in the nose respond to allergens. Allergens are particles in the air that cause your body to have an allergic reaction. This causes you to release allergic antibodies. Through a chain of events, these eventually cause you to release histamine into the blood stream. Although meant to protect the body, it is this release of histamine that causes your discomfort, such as frequent sneezing, congestion, and an itchy, runny nose.  CAUSES  Seasonal allergic rhinitis (hay fever) is caused by pollen allergens that may come from grasses, trees, and weeds. Year-round allergic rhinitis (perennial allergic rhinitis) is caused by allergens such as house dust mites, pet dander, and mold spores.  SYMPTOMS   Nasal stuffiness (congestion).  Itchy, runny nose with sneezing and tearing of the eyes. DIAGNOSIS  Your health care provider can help you determine the allergen or allergens that trigger your symptoms. If you and your health care provider are unable to determine the allergen, skin or blood testing may be used. TREATMENT  Allergic Rhinitis does not have a cure, but it can be controlled by:  Medicines and allergy shots (immunotherapy).  Avoiding the allergen. Hay fever may often be treated with antihistamines in pill or nasal spray forms. Antihistamines block the effects of histamine. There are over-the-counter medicines that may help with nasal congestion and swelling around the eyes. Check with your health care provider before taking or giving this medicine.  If avoiding the allergen or the medicine prescribed do not work, there are many new medicines your health care provider can prescribe. Stronger medicine may be used if initial measures are ineffective. Desensitizing injections can be used if medicine and avoidance does not work. Desensitization is when a patient is given ongoing shots until the body becomes less sensitive to the allergen.  Make sure you follow up with your health care provider if problems continue. HOME CARE INSTRUCTIONS It is not possible to completely avoid allergens, but you can reduce your symptoms by taking steps to limit your exposure to them. It helps to know exactly what you are allergic to so that you can avoid your specific triggers. SEEK MEDICAL CARE IF:   You have a fever.  You develop a cough that does not stop easily (persistent).  You have shortness of breath.  You start wheezing.  Symptoms interfere with normal daily activities. Document Released: 01/18/2001 Document Revised: 02/13/2013 Document Reviewed: 12/31/2012 [employer] Patient Information 2014 [employer].  

## 2013-07-25 NOTE — Progress Notes (Signed)
   Subjective:    Patient ID: Michael Livingston, male    DOB: 09-16-1960, 53 y.o.   MRN: [id number]  HPI  Patient in today c/o severe allergy symptoms- eyes stay watery and itchy and has sneezing attacks several times a day- Feels like his nose is constatntly running. Using flonase OTC, pataday and loratadine. Hasn't helped much.    Review of Systems  Constitutional: Negative.  Negative for fever, chills and appetite change.  HENT: Positive for congestion, postnasal drip, rhinorrhea and sneezing. Negative for sinus pressure.   Respiratory: Negative for cough and shortness of breath.   Cardiovascular: Negative.   Gastrointestinal: Negative.   Genitourinary: Negative.   All other systems reviewed and are negative.       Objective:   Physical Exam  Constitutional: He is oriented to person, place, and time. He appears well-developed and well-nourished.  HENT:  Right Ear: Hearing, tympanic membrane, external ear and ear canal normal.  Left Ear: Hearing, tympanic membrane, external ear and ear canal normal.  Nose: Mucosal edema and rhinorrhea present. Right sinus exhibits no maxillary sinus tenderness and no frontal sinus tenderness. Left sinus exhibits no maxillary sinus tenderness and no frontal sinus tenderness.  Mouth/Throat: Uvula is midline, oropharynx is clear and moist and mucous membranes are normal.  Eyes: Conjunctivae are normal. Pupils are equal, round, and reactive to light.  Neck: Normal range of motion. Neck supple.  Cardiovascular: Normal rate, regular rhythm and normal heart sounds.   Pulmonary/Chest: Effort normal and breath sounds normal.  Neurological: He is alert and oriented to person, place, and time. He has normal reflexes.  Skin: Skin is warm and dry.  Psychiatric: He has a normal mood and affect. His behavior is normal. Judgment and thought content normal.    BP 116/69  Pulse 52  Temp(Src) 97.1 F (36.2 C) (Oral)  Ht 6\' 3"  (1.905 m)  Wt 220 lb (99.791 kg)  BMI  27.50 kg/m2       Assessment & Plan:   1. Allergic rhinitis    Meds ordered this encounter  Medications  . triamcinolone acetonide (KENALOG-40) injection 60 mg    Sig:    1. Take meds as prescribed 2. Use a cool mist humidifier especially during the winter months and when heat has been humid. 3. Use saline nose sprays frequently 4. Saline irrigations of the nose can be very helpful if done frequently.  * 4X daily for 1 week*  * Use of a nettie pot can be helpful with this. Follow directions with this* 5. Drink plenty of fluids 6. Keep thermostat turn down low 7.For any cough or congestion  Use plain Mucinex- regular strength or max strength is fine   * Children- consult with Pharmacist for dosing 8. For fever or aces or pains- take tylenol or ibuprofen appropriate for age and weight.  * for fevers greater than 101 orally you may alternate ibuprofen and tylenol every  3 hours.   Mary-Margaret Hassell Done, FNP

## 2013-07-26 ENCOUNTER — Encounter: Payer: Self-pay | Admitting: Gastroenterology

## 2013-07-26 ENCOUNTER — Ambulatory Visit (INDEPENDENT_AMBULATORY_CARE_PROVIDER_SITE_OTHER): Payer: BC Managed Care – PPO | Admitting: Gastroenterology

## 2013-07-26 VITALS — BP 118/78 | HR 56 | Ht 73.75 in | Wt 215.0 lb

## 2013-07-26 DIAGNOSIS — Z7901 Long term (current) use of anticoagulants: Secondary | ICD-10-CM

## 2013-07-26 DIAGNOSIS — Z1211 Encounter for screening for malignant neoplasm of colon: Secondary | ICD-10-CM

## 2013-07-26 MED ORDER — MOVIPREP 100 G PO SOLR: 1.0000 | Freq: Once | ORAL | Status: DC

## 2013-07-26 NOTE — Progress Notes (Signed)
HPI: This is a  very pleasant 53 year old man whom I am meeting for the first time today.   No bowel changes.  No fh of colon cancer.  Specifically no bleeding, no constipation, no significant diarrhea.  On plavix, two stents placed in coronaries in 2005.  He believes they were bare metal.     Review of systems: Pertinent positive and negative review of systems were noted in the above HPI section. Complete review of systems was performed and was otherwise normal.    Past Medical History  Diagnosis Date  . Heart disease   . Hypertension   . Hyperlipidemia   . Chronic kidney disease   . Diabetes mellitus without complication     type 2    Past Surgical History  Procedure Laterality Date  . Knee arthroscopy      left 2003, right 2005  . Cardiac surgery  2005    stent    Current Outpatient Prescriptions  Medication Sig Dispense Refill  . aspirin 81 MG tablet Take 81 mg by mouth daily.      Sarajane Marek Sodium 30-100 MG CAPS Take by mouth daily. OTC      . clopidogrel (PLAVIX) 75 MG tablet Take 75 mg by mouth daily.       . metoprolol (LOPRESSOR) 50 MG tablet TAKE 1 TABLET TWICE DAILY, NEEDS TO BE SEEN  50 tablet  0  . omega-3 acid ethyl esters (LOVAZA) 1 G capsule Take 1 capsule (1 g total) by mouth daily.  30 capsule  11  . ramipril (ALTACE) 5 MG capsule Take 1 capsule (5 mg total) by mouth daily.  90 capsule  2  . VYTORIN 10-40 MG per tablet TAKE 1 TABLET EVERY DAY  90 tablet  2   No current facility-administered medications for this visit.    Allergies as of 07/26/2013 - Review Complete 07/26/2013  Allergen Reaction Noted  . Aleve [naproxen sodium]  07/16/2012  . Sulfa antibiotics  07/16/2012    Family History  Problem Relation Age of Onset  . Diabetes Mother   . Heart disease Father   . Diabetes Father   . Heart disease Paternal Grandfather     History   Social History  . Marital Status: Married    Spouse Name: N/A    Number of Children: N/A   . Years of Education: N/A   Occupational History  . Not on file.   Social History Main Topics  . Smoking status: Former Smoker -- 1.00 packs/day for 10 years    Start date: 07/16/1977    Quit date: 07/17/1987  . Smokeless tobacco: Never Used  . Alcohol Use: No  . Drug Use: No  . Sexual Activity: Not on file   Other Topics Concern  . Not on file   Social History Narrative  . No narrative on file       Physical Exam: BP 118/78  Pulse 56  Ht 6' 1.75" (1.873 m)  Wt 215 lb (97.523 kg)  BMI 27.80 kg/m2 Constitutional: generally well-appearing Psychiatric: alert and oriented x3 Eyes: extraocular movements intact Mouth: oral pharynx moist, no lesions Neck: supple no lymphadenopathy Cardiovascular: heart regular rate and rhythm Lungs: clear to auscultation bilaterally Abdomen: soft, nontender, nondistended, no obvious ascites, no peritoneal signs, normal bowel sounds Extremities: no lower extremity edema bilaterally Skin: no lesions on visible extremities    Assessment and plan: 53 y.o. male with  routine risk for colon cancer, elevated risk for procedural complications given ongoing  blood thinner use  We will communicate with his cardiologist about the safety of him holding his Plavix for 5 days prior to a colonoscopy. I assume that will probably be okay with Dr. Claiborne Billings. We will plan for colonoscopy at his soonest convenience. I see no reason for any further blood tests or imaging studies prior to that.

## 2013-07-26 NOTE — Patient Instructions (Addendum)
We will contact Dr. Shelva Majestic from cardiology about holding your plavix for 5 days prior to colonoscopy. You will be set up for a colonoscopy for routine cancer screening.  Look for a Monday appt. [facility].

## 2013-08-19 ENCOUNTER — Telehealth: Payer: Self-pay

## 2013-08-19 ENCOUNTER — Telehealth: Payer: Self-pay | Admitting: Cardiovascular Disease

## 2013-08-19 NOTE — Telephone Encounter (Signed)
Spoke w/ Patty at Dr. Ardis Hughs office.  Stated she has been sending a letter to Dr. Evette Georges In Basket r/t pt holding Plavix for procedure w/o response.  Informed Dr. Claiborne Billings likely has not seen it.  Informed RN will print and place on his cart.  Verbalized understanding.  Letter located and printed.  Given to Mariann Laster to have Dr. Claiborne Billings review for instructions.  Message forwarded to Dr. Arnette Norris, CMA.

## 2013-08-19 NOTE — Telephone Encounter (Signed)
I faxed a copy of the letter to [name] and she will put it on Dr [facility] desk for review

## 2013-08-19 NOTE — Telephone Encounter (Signed)
Message copied by Barron Alvine on Mon Aug 19, 2013  8:38 AM ------      Message from: Marzella Schlein      Created: Fri Aug 16, 2013  8:43 AM                   ----- Message -----         From: Larina Bras, CMA         Sent: 08/16/2013           To: Barron Alvine, CMA                        ----- Message -----         From: Barron Alvine, CMA         Sent: 08/15/2013           To: Barron Alvine, CMA                        ----- Message -----         From: Barron Alvine, CMA         Sent: 08/12/2013           To: Barron Alvine, CMA                        ----- Message -----         From: Barron Alvine, CMA         Sent: 08/05/2013           To: Barron Alvine, CMA            Waiting for anti coag response from Dr Claiborne Billings ------

## 2013-08-21 ENCOUNTER — Encounter: Payer: Self-pay | Admitting: Pharmacist Clinician (PhC)/ Clinical Pharmacy Specialist

## 2013-08-21 NOTE — Telephone Encounter (Signed)
Michael Livingston is out of the office today Sunday Spillers will put a note for Michael Livingston to return call when she is back

## 2013-08-21 NOTE — Telephone Encounter (Signed)
Patty called again today-she she is still waiting to hear from Dr Claiborne Billings.

## 2013-08-22 ENCOUNTER — Telehealth: Payer: Self-pay | Admitting: Gastroenterology

## 2013-08-22 NOTE — Telephone Encounter (Signed)
Note sent okay to hold plavix via Epic.

## 2013-08-22 NOTE — Telephone Encounter (Signed)
Faxed anticoag letter.

## 2013-08-22 NOTE — Telephone Encounter (Signed)
Spoke  with Abigail Butts @ Dr. Ardis Hughs office to inform her to have the colonoscopy clearance request faxed to me. Dr. Claiborne Billings doesn't always look in his in basket  In a timely matter, therefore faxing it will be faster for his approval.

## 2013-08-23 NOTE — Telephone Encounter (Signed)
Pt has been notified and will hold plavix 5 days prior

## 2013-08-28 ENCOUNTER — Telehealth: Payer: Self-pay

## 2013-08-28 ENCOUNTER — Telehealth: Payer: Self-pay | Admitting: *Deleted

## 2013-08-28 NOTE — Telephone Encounter (Signed)
August 21, 2013 Dr. Owens Loffler [name] [birth year] This patient may hold aspirin 5 days prior to procedure and resume once procedure is complete. This patient may hold Plavix 5 days prior to procedure and resume once procedure is complete. If you have any questions or concerns, please don't hesitate to call. Sincerely, Shelva Majestic MD ----- Message ----- From: Fidel Levy, RN Sent: 08/28/2013 10:58 AM To: Michael Banister, MD   Pt has been notified to hold plavix 5 days prior to procedure

## 2013-08-28 NOTE — Telephone Encounter (Signed)
Faxed clearance to hold ASA & plavix 5 days prior to procedure and resume after, per Dr. Corky Downs

## 2013-09-09 ENCOUNTER — Ambulatory Visit (AMBULATORY_SURGERY_CENTER): Payer: BC Managed Care – PPO | Admitting: Gastroenterology

## 2013-09-09 ENCOUNTER — Encounter: Payer: Self-pay | Admitting: Gastroenterology

## 2013-09-09 VITALS — BP 117/72 | HR 47 | Temp 96.2°F | Resp 27 | Ht 73.0 in | Wt 215.0 lb

## 2013-09-09 DIAGNOSIS — D126 Benign neoplasm of colon, unspecified: Secondary | ICD-10-CM

## 2013-09-09 DIAGNOSIS — Z1211 Encounter for screening for malignant neoplasm of colon: Secondary | ICD-10-CM

## 2013-09-09 MED ORDER — SODIUM CHLORIDE 0.9 % IV SOLN: 500.0000 mL | INTRAVENOUS | Status: DC

## 2013-09-09 NOTE — Op Note (Signed)
[place]  [address] [place], [id number]   COLONOSCOPY PROCEDURE REPORT  PATIENT: Michael Livingston, Michael Livingston  MR#: [id number] BIRTHDATE: [birth year] , 52  yrs. old GENDER: Male ENDOSCOPIST: Milus Banister, MD REFERRED CV:ELFYB Elease Hashimoto, M.D. PROCEDURE DATE:  09/09/2013 PROCEDURE:   Colonoscopy with snare polypectomy First Screening Colonoscopy - Avg.  risk and is 50 yrs.  old or older Yes.  Prior Negative Screening - Now for repeat screening. N/A  History of Adenoma - Now for follow-up colonoscopy & has been > or = to 3 yrs.  N/A  Polyps Removed Today? Yes. ASA CLASS:   Class II INDICATIONS:average risk screening. MEDICATIONS: MAC sedation, administered by CRNA and Propofol (Diprivan) 270 mg IV  DESCRIPTION OF PROCEDURE:   After the risks benefits and alternatives of the procedure were thoroughly explained, informed consent was obtained.  A digital rectal exam revealed no abnormalities of the rectum.   The [id number]  endoscope was introduced through the anus and advanced to the cecum, which was identified by both the appendix and ileocecal valve. No adverse events experienced.   The quality of the prep was good.  The instrument was then slowly withdrawn as the colon was fully examined.  COLON FINDINGS: Three polyps were found, removed and sent to pathology.  These were all sessile, located in cecum, transverse and sigmoid segment, 3-60mm across, removed with cold snare.  The examination was otherwise normal.  Retroflexed views revealed no abnormalities. The time to cecum=3 minutes 59 seconds.  Withdrawal time=10 minutes 28 seconds.  The scope was withdrawn and the procedure completed. COMPLICATIONS: There were no complications.  ENDOSCOPIC IMPRESSION: Three polyps were found, removed and sent to pathology. The examination was otherwise normal.  RECOMMENDATIONS: If the polyp(s) removed today are proven to be adenomatous (pre-cancerous) polyps, you will  need a colonoscopy in 3-5 years. Otherwise you should continue to follow colorectal cancer screening guidelines for "routine risk" patients with a colonoscopy in 10 years.  You will receive a letter within 1-2 weeks with the results of your biopsy as well as final recommendations.  Please call my office if you have not received a letter after 3 weeks. You can resume your plavix today.  eSigned:  Milus Banister, MD 09/09/2013 2:16 PM

## 2013-09-09 NOTE — Patient Instructions (Signed)
YOU HAD AN ENDOSCOPIC PROCEDURE TODAY AT THE [facility]: Refer to the procedure report that was given to you for any specific questions about what was found during the examination.  If the procedure report does not answer your questions, please call your gastroenterologist to clarify.  If you requested that your care partner not be given the details of your procedure findings, then the procedure report has been included in a sealed envelope for you to review at your convenience later.  YOU SHOULD EXPECT: Some feelings of bloating in the abdomen. Passage of more gas than usual.  Walking can help get rid of the air that was put into your GI tract during the procedure and reduce the bloating. If you had a lower endoscopy (such as a colonoscopy or flexible sigmoidoscopy) you may notice spotting of blood in your stool or on the toilet paper. If you underwent a bowel prep for your procedure, then you may not have a normal bowel movement for a few days.  DIET: Your first meal following the procedure should be a light meal and then it is ok to progress to your normal diet.  A half-sandwich or bowl of soup is an example of a good first meal.  Heavy or fried foods are harder to digest and may make you feel nauseous or bloated.  Likewise meals heavy in dairy and vegetables can cause extra gas to form and this can also increase the bloating.  Drink plenty of fluids but you should avoid alcoholic beverages for 24 hours.  ACTIVITY: Your care partner should take you home directly after the procedure.  You should plan to take it easy, moving slowly for the rest of the day.  You can resume normal activity the day after the procedure however you should NOT DRIVE or use heavy machinery for 24 hours (because of the sedation medicines used during the test).    SYMPTOMS TO REPORT IMMEDIATELY: A gastroenterologist can be reached at any hour.  During normal business hours, 8:30 AM to 5:00 PM Monday through Friday,  call [phone].  After hours and on weekends, please call the GI answering service at [phone] who will take a message and have the physician on call contact you.   Following lower endoscopy (colonoscopy or flexible sigmoidoscopy):  Excessive amounts of blood in the stool  Significant tenderness or worsening of abdominal pains  Swelling of the abdomen that is new, acute  Fever of 100F or higher  FOLLOW UP: If any biopsies were taken you will be contacted by phone or by letter within the next 1-3 weeks.  Call your gastroenterologist if you have not heard about the biopsies in 3 weeks.  Our staff will call the home number listed on your records the next business day following your procedure to check on you and address any questions or concerns that you may have at that time regarding the information given to you following your procedure. This is a courtesy call and so if there is no answer at the home number and we have not heard from you through the emergency physician on call, we will assume that you have returned to your regular daily activities without incident.  SIGNATURES/CONFIDENTIALITY: You and/or your care partner have signed paperwork which will be entered into your electronic medical record.  These signatures attest to the fact that that the information above on your After Visit Summary has been reviewed and is understood.  Full responsibility of the confidentiality of this   discharge information lies with you and/or your care-partner.  Please, read the polyp handout.

## 2013-09-09 NOTE — Progress Notes (Signed)
Procedure ends, to recovery, report given and VSS. 

## 2013-09-09 NOTE — Progress Notes (Signed)
Called to room to assist during endoscopic procedure.  Patient ID and intended procedure confirmed with present staff. Received instructions for my participation in the procedure from the performing physician.  

## 2013-09-10 ENCOUNTER — Telehealth: Payer: Self-pay | Admitting: *Deleted

## 2013-09-10 NOTE — Telephone Encounter (Signed)
  Follow up Call-  Call back number 09/09/2013  Post procedure Call Back phone  # [phone]  Permission to leave phone message Yes     Patient questions:  Do you have a fever, pain , or abdominal swelling? no Pain Score  0 *  Have you tolerated food without any problems? yes  Have you been able to return to your normal activities? yes  Do you have any questions about your discharge instructions: Diet   no Medications  no Follow up visit  no  Do you have questions or concerns about your Care? no  Actions: * If pain score is 4 or above: No action needed, pain <4.

## 2013-09-17 ENCOUNTER — Encounter: Payer: Self-pay | Admitting: Gastroenterology

## 2013-09-26 ENCOUNTER — Telehealth: Payer: Self-pay | Admitting: Cardiovascular Disease

## 2013-09-26 NOTE — Telephone Encounter (Signed)
Pt's wife stated that he has been having numbness in different parts of his body and today he stated he's feeling numbness in his arms from the elbow down, stated he told Dr. Claiborne Billings about it on his last visit but no other comment was made. 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 I told pt's wife that he developed any additional problems to let us know . That he really needs to inform his PCP. Pt stated understanding of instructions

## 2013-09-26 NOTE — Telephone Encounter (Signed)
Pt says both of his arms feel numb,from the elbow on down. Please call to advise.

## 2013-10-07 ENCOUNTER — Other Ambulatory Visit: Payer: Self-pay | Admitting: *Deleted

## 2013-10-07 MED ORDER — CLOPIDOGREL BISULFATE 75 MG PO TABS: 75.0000 mg | ORAL_TABLET | Freq: Every day | ORAL | Status: DC

## 2013-10-07 NOTE — Telephone Encounter (Signed)
Rx was sent to pharmacy electronically. 

## 2013-11-26 ENCOUNTER — Other Ambulatory Visit: Payer: Self-pay | Admitting: *Deleted

## 2013-11-26 MED ORDER — OMEGA-3-ACID ETHYL ESTERS 1 G PO CAPS: 1.0000 g | ORAL_CAPSULE | Freq: Every day | ORAL | Status: DC

## 2013-11-26 NOTE — Telephone Encounter (Signed)
Rx refill sent to patient pharmacy   

## 2013-12-04 ENCOUNTER — Other Ambulatory Visit: Payer: Self-pay

## 2013-12-04 MED ORDER — CLOPIDOGREL BISULFATE 75 MG PO TABS: 75.0000 mg | ORAL_TABLET | Freq: Every day | ORAL | Status: DC

## 2013-12-04 NOTE — Telephone Encounter (Signed)
Rx was sent to pharmacy electronically. 

## 2014-01-16 ENCOUNTER — Telehealth: Payer: Self-pay | Admitting: Cardiovascular Disease

## 2014-01-16 MED ORDER — NITROGLYCERIN 0.4 MG SL SUBL: 0.4000 mg | SUBLINGUAL_TABLET | SUBLINGUAL | Status: DC | PRN

## 2014-01-16 NOTE — Telephone Encounter (Signed)
Rx was sent to pharmacy electronically. 

## 2014-01-16 NOTE — Telephone Encounter (Signed)
Pt's wife called in stating that [place] needs a new prescription for NGT because his original prescription is outdated. Please call  Thanks

## 2014-01-20 ENCOUNTER — Encounter: Payer: Self-pay | Admitting: Cardiovascular Disease

## 2014-01-20 ENCOUNTER — Ambulatory Visit (INDEPENDENT_AMBULATORY_CARE_PROVIDER_SITE_OTHER): Payer: BC Managed Care – PPO | Admitting: Cardiovascular Disease

## 2014-01-20 VITALS — BP 120/78 | HR 50 | Ht 74.0 in | Wt 211.9 lb

## 2014-01-20 DIAGNOSIS — R0789 Other chest pain: Secondary | ICD-10-CM

## 2014-01-20 DIAGNOSIS — R5383 Other fatigue: Secondary | ICD-10-CM

## 2014-01-20 DIAGNOSIS — R5381 Other malaise: Secondary | ICD-10-CM

## 2014-01-20 DIAGNOSIS — E119 Type 2 diabetes mellitus without complications: Secondary | ICD-10-CM

## 2014-01-20 DIAGNOSIS — E8881 Metabolic syndrome: Secondary | ICD-10-CM

## 2014-01-20 DIAGNOSIS — R079 Chest pain, unspecified: Secondary | ICD-10-CM

## 2014-01-20 DIAGNOSIS — E785 Hyperlipidemia, unspecified: Secondary | ICD-10-CM

## 2014-01-20 DIAGNOSIS — I2510 Atherosclerotic heart disease of native coronary artery without angina pectoris: Secondary | ICD-10-CM

## 2014-01-20 LAB — LIPID PANEL
CHOL/HDL RATIO: 2.7 ratio
Cholesterol: 102 mg/dL (ref 0–200)
HDL: 38 mg/dL — ABNORMAL LOW (ref >39)
LDL Cholesterol: 40 mg/dL (ref 0–99)
TRIGLYCERIDES: 121 mg/dL (ref <150)
VLDL: 24 mg/dL (ref 0–40)

## 2014-01-20 LAB — COMPREHENSIVE METABOLIC PANEL
ALBUMIN: 4.7 g/dL (ref 3.5–5.2)
ALT: 17 U/L (ref 0–53)
AST: 19 U/L (ref 0–37)
Alkaline Phosphatase: 54 U/L (ref 39–117)
BUN: 14 mg/dL (ref 6–23)
CHLORIDE: 101 meq/L (ref 96–112)
CO2: 24 mEq/L (ref 19–32)
CREATININE: 0.86 mg/dL (ref 0.50–1.35)
Calcium: 9.9 mg/dL (ref 8.4–10.5)
GLUCOSE: 101 mg/dL — AB (ref 70–99)
Potassium: 4.6 mEq/L (ref 3.5–5.3)
Sodium: 138 mEq/L (ref 135–145)
Total Bilirubin: 1.2 mg/dL (ref 0.2–1.2)
Total Protein: 7.3 g/dL (ref 6.0–8.3)

## 2014-01-20 LAB — CBC
HEMATOCRIT: 43.8 % (ref 39.0–52.0)
HEMOGLOBIN: 15.6 g/dL (ref 13.0–17.0)
MCH: 31.6 pg (ref 26.0–34.0)
MCHC: 35.6 g/dL (ref 30.0–36.0)
MCV: 88.8 fL (ref 78.0–100.0)
Platelets: 217 10*3/uL (ref 150–400)
RBC: 4.93 MIL/uL (ref 4.22–5.81)
RDW: 13 % (ref 11.5–15.5)
WBC: 6.4 10*3/uL (ref 4.0–10.5)

## 2014-01-20 LAB — TSH: TSH: 1.676 u[IU]/mL (ref 0.350–4.500)

## 2014-01-20 NOTE — Progress Notes (Signed)
Patient ID: Michael Livingston, male   DOB: [birth year], 53 y.o.   MRN: [id number]     HPI: [name] is a 53 y.o. male presents to the office for one year cardiology evaluation and is seen earlier  due to recent left-sided chest discomfort  [name] is a 53 year old gentleman who has known coronary artery disease in October 2005 underwent stenting to his proximal to mid left anterior descending artery. His last catheterization was in October 2007 and his LAD was  was widely patent. He did have mild 30% circumflex stenosis. A nuclear perfusion study in January 2013 continued to show normal perfusion.  [name] has a history of palpitations which have been controlled with beta blocker therapy. He's also had a history of hyperlipidemia with an atherogenic lipid panel with low HDL levels. One year ago, an NMR lipoprofile did demonstrate increased insulin resistance and an increased wall LDL particles.  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 Since that time, he has lost over 20 pounds. He denies recent chest pain. He denies significant increase in palpitations except a rare episode. He does note significant fatigue. He also is concerned about his testosterone level contributing to this fatigability.  I last saw him in November 2014.  This weekend, he was doing being in the yard.  2 days ago, he developed a sharp, left-sided chest discomfort.  This lasted less than 30 seconds, but is warranted.  He then experienced right jaw pain and was concerned.  The following day he developed some interscapular pain.  He was concerned with this recurrent symptomatology.  He called the office.  His schedule is moved up.  He is now seen for evaluation.  Upon further questioning, he denies any clear-cut change in exercise capacity.  He was doing a weedeater to trim his yard and was holding his predominantly with his left  arm.  He denies PND or orthopnea.  He denies palpitations.  He has not had laboratory checked in approximately a year.  He presents for evaluation.  Past Medical History  Diagnosis Date  . Heart disease   . Hypertension   . Hyperlipidemia   . Allergy   . Diabetes mellitus without complication     type 2, no meds needed  . Chronic kidney disease     1 kidney stone  . Myocardial infarction     Past Surgical History  Procedure Laterality Date  . Knee arthroscopy      left 2003, right 2005  . Cardiac surgery  2005    stent    Allergies  Allergen Reactions  . Aleve [Naproxen Sodium]     Swollen mouth  . Sulfa Antibiotics     hives    Current Outpatient Prescriptions  Medication Sig Dispense Refill  . aspirin 81 MG tablet Take 81 mg by mouth daily.      . clopidogrel (PLAVIX) 75 MG tablet Take 1 tablet (75 mg total) by mouth daily.  90 tablet  1  . metoprolol tartrate (LOPRESSOR) 25 MG tablet Take 25 mg by mouth 2 (two) times daily.      . nitroGLYCERIN (NITROSTAT) 0.4 MG SL tablet Place 1 tablet (0.4 mg total) under the tongue every 5 (five) minutes as needed for chest pain.  25 tablet  1  . omega-3 acid ethyl esters (LOVAZA) 1 G capsule Take 1 capsule (1 g total) by mouth  daily.  90 capsule  1  . ramipril (ALTACE) 5 MG capsule Take 1 capsule (5 mg total) by mouth daily.  90 capsule  2  . VYTORIN 10-40 MG per tablet TAKE 1 TABLET EVERY DAY  90 tablet  2   No current facility-administered medications for this visit.    History   Social History  . Marital Status: Married    Spouse Name: N/A    Number of Children: N/A  . Years of Education: N/A   Occupational History  . Not on file.   Social History Main Topics  . Smoking status: Former Smoker -- 1.00 packs/day for 10 years    Start date: 07/16/1977    Quit date: 07/17/1987  . Smokeless tobacco: Never Used  . Alcohol Use: No  . Drug Use: No  . Sexual Activity: Not on file   Other Topics Concern  . Not on file     Social History Narrative  . No narrative on file    Family History  Problem Relation Age of Onset  . Diabetes Mother   . Heart disease Father   . Diabetes Father   . Heart disease Paternal Grandfather   . Colon cancer Neg Hx   . Esophageal cancer Neg Hx   . Stomach cancer Neg Hx   . Rectal cancer Neg Hx    Social history is notable that he is married and has 2 children. There is no tobacco or alcohol use.   ROS General: Negative; No fevers, chills, or night sweats; there is purposeful weight loss HEENT: Negative; No changes in vision or hearing, sinus congestion, difficulty swallowing Pulmonary: Negative; No cough, wheezing, shortness of breath, hemoptysis Cardiovascular: See history of present illness: No presyncope, syncope, palpitations GI: Negative; No nausea, vomiting, diarrhea, or abdominal pain GU: Negative; No dysuria, hematuria, or difficulty voiding Musculoskeletal: Negative; no myalgias, joint pain, or weakness Hematologic/Oncology: Negative; no easy bruising, bleeding Endocrine: Negative; no heat/cold intolerance; no diabetes Neuro: Negative; no changes in balance, headaches Skin: Negative; No rashes or skin lesions Psychiatric: Negative; No behavioral problems, depression Sleep: Negative; No snoring, daytime sleepiness, hypersomnolence, bruxism, restless legs, hypnogognic hallucinations, no cataplexy Other comprehensive 14 point system review is negative.   PE BP 120/78  Pulse 50  Ht 6\' 2"  (1.88 m)  Wt 211 lb 14.4 oz (96.117 kg)  BMI 27.19 kg/m2  General: Alert, oriented, no distress.  Skin: normal turgor, no rashes HEENT: Normocephalic, atraumatic. Pupils round and reactive; sclera anicteric;no lid lag.  Nose without nasal septal hypertrophy Mouth/Parynx benign; Mallinpatti scale 2 Neck: No JVD, no carotid bruits .  Normal carotid upstroke Lungs: clear to ausculatation and percussion; no wheezing or rales Chest wall: Nontender to palpation Heart:  RRR, s1 s2 normal  Abdomen: soft, nontender; no hepatosplenomehaly, BS+; abdominal aorta nontender and not dilated by palpation. Pulses 2+ Back: No CVA tenderness Extremities: no clubbing cyanosis or edema, Homan's sign negative  Neurologic: grossly nonfocal Psychologic: normal affect and mood.  ECG:( Independently read by me): Sinus bradycardia 50 beats per minute.  No ST segment changes.  Normal intervals.  Prior November 2014 ECG: Sinus bradycardia at 45 beats per minute. No significant ST-T change.  LABS:  BMET No results found for this basename: na,  k,  cl,  co2,  glucose,  bun,  creatinine,  calcium,  gfrnonaa,  gfraa     Hepatic Function Panel  No results found for this basename: prot,  albumin,  ast,  alt,  alkphos,  bilitot,  bilidir,  ibili     CBC    Component Value Date/Time   WBC 5.1 04/02/2013 0903     BNP No results found for this basename: probnp    Lipid Panel  No results found for this basename: chol,  trig,  hdl,  cholhdl,  vldl,  ldlcalc     RADIOLOGY: No results found.    ASSESSMENT AND PLAN: Mr. Gibran Veselka is now 10 years status post stenting to his proximal to mid LAD, which was done in October 2005.  At his last catheterization in October 2007.  His LAD was widely patent and he did have mild 30% narrowing.  A nuclear perfusion study in January 2002.  Continued to show normal perfusion.  His recent episode of chest discomfort.  I suspect is musculoskeletal in etiology.  The patient had been using a weedeater that morning.  Suspect he experienced sharp discomfort in the region of the serratus anterior musculature.  His right jaw radiation , sharp discomfort was transient.  He denies exertional chest tightness, or change in exertional shortness of breath symptomatology.  Presently, I do not feel further testing is necessary.  However, I have recommended a complete set of followup laboratory.  He is fasting today.  I will contact him regarding  blood work if adjustments need to be made in his medical regimen.  His blood pressure today is well controlled on ramipril, 5 mg in addition to his metoprolol 25 mg twice a day.  He is continuing to be on dual antiplatelet therapy with low-dose aspirin and Plavix.  He is on Vytorin 10/40 for hyperlipidemia.  As long as he remains stable, I will see him in one year for followup evaluation.    Troy Sine, MD, [facility]  01/20/2014 4:30 PM

## 2014-01-20 NOTE — Patient Instructions (Signed)
Your physician recommends that you return for lab work.  Your physician wants you to follow-up in: 1 year. You will receive a reminder letter in the mail two months in advance. If you don't receive a letter, please call our office to schedule the follow-up appointment.

## 2014-01-21 LAB — HEMOGLOBIN A1C
Hgb A1c MFr Bld: 6 % — ABNORMAL HIGH (ref <5.7)
Mean Plasma Glucose: 126 mg/dL — ABNORMAL HIGH (ref <117)

## 2014-02-20 ENCOUNTER — Other Ambulatory Visit: Payer: Self-pay | Admitting: Cardiovascular Disease

## 2014-02-21 ENCOUNTER — Other Ambulatory Visit: Payer: Self-pay

## 2014-02-21 NOTE — Telephone Encounter (Signed)
Rx was sent to pharmacy electronically. 

## 2014-03-12 ENCOUNTER — Telehealth: Payer: Self-pay | Admitting: Cardiovascular Disease

## 2014-03-12 NOTE — Telephone Encounter (Signed)
Please call,would like lab results from about 3 weeks ago.

## 2014-03-12 NOTE — Telephone Encounter (Signed)
I spoke with the patient and he is aware of his lab results. He has not checked My Chart.

## 2014-03-25 ENCOUNTER — Ambulatory Visit: Payer: BC Managed Care – PPO | Admitting: Cardiovascular Disease

## 2014-05-09 ENCOUNTER — Other Ambulatory Visit: Payer: Self-pay | Admitting: Cardiovascular Disease

## 2014-05-12 NOTE — Telephone Encounter (Signed)
Rx(s) sent to pharmacy electronically.  

## 2014-05-18 ENCOUNTER — Other Ambulatory Visit: Payer: Self-pay | Admitting: Cardiovascular Disease

## 2014-05-19 ENCOUNTER — Other Ambulatory Visit: Payer: Self-pay | Admitting: Cardiovascular Disease

## 2014-05-20 NOTE — Telephone Encounter (Signed)
Rx(s) sent to pharmacy electronically.  

## 2014-07-21 ENCOUNTER — Other Ambulatory Visit: Payer: Self-pay | Admitting: Dermatology

## 2014-10-17 ENCOUNTER — Ambulatory Visit
Admission: RE | Admit: 2014-10-17 | Discharge: 2014-10-17 | Disposition: A | Discharge status: Home / Self Care | Payer: Worker's Compensation | Source: Ambulatory Visit | Attending: Family Medicine | Admitting: Family Medicine

## 2014-10-17 ENCOUNTER — Telehealth: Payer: Self-pay | Admitting: Cardiovascular Disease

## 2014-10-17 ENCOUNTER — Other Ambulatory Visit: Payer: Self-pay | Admitting: Family Medicine

## 2014-10-17 DIAGNOSIS — T1490XA Injury, unspecified, initial encounter: Secondary | ICD-10-CM

## 2014-10-17 NOTE — Telephone Encounter (Signed)
Spoke with patient, he is to take ibuprofen 800 mg tid x 7 days.  He is currently on aspirin and plavix.   Stent was in 2005, cath in 2007 showed patent.  Advised to hold his aspirin while taking and be aware that he may have some increased bruising.  Advised no more than 7 days then return to his current regimen.  Patient voiced understanding

## 2014-10-17 NOTE — Telephone Encounter (Signed)
Pt called stating that he has a sprained shoulder and the Orthopedic doctor is wanting him to take Ibuprofen 800mg  and he wanted to make sure that taking this medication would not effect the other meds he is on. Please f/u  Thanks

## 2014-11-03 ENCOUNTER — Other Ambulatory Visit: Payer: Self-pay

## 2014-11-12 ENCOUNTER — Telehealth: Payer: Self-pay | Admitting: Cardiovascular Disease

## 2014-11-12 NOTE — Telephone Encounter (Signed)
Pt needs MRI on his shoulder,very concerned because he has 2 stents. Is it all right to have the MRI?

## 2014-11-12 NOTE — Telephone Encounter (Signed)
Pt. Needs MRI of his shoulder and has two stents in place are there any concerns

## 2014-11-13 NOTE — Telephone Encounter (Signed)
It should be ok; stents not recently placed

## 2014-11-17 NOTE — Telephone Encounter (Signed)
Pt. Has been called and informed that it is ok to have MRI

## 2015-01-23 ENCOUNTER — Ambulatory Visit (INDEPENDENT_AMBULATORY_CARE_PROVIDER_SITE_OTHER): Payer: Self-pay | Admitting: Cardiovascular Disease

## 2015-01-23 VITALS — BP 126/70 | HR 58 | Ht 75.0 in | Wt 219.5 lb

## 2015-01-23 DIAGNOSIS — E785 Hyperlipidemia, unspecified: Secondary | ICD-10-CM

## 2015-01-23 DIAGNOSIS — I25810 Atherosclerosis of coronary artery bypass graft(s) without angina pectoris: Secondary | ICD-10-CM | POA: Diagnosis not present

## 2015-01-23 DIAGNOSIS — E119 Type 2 diabetes mellitus without complications: Secondary | ICD-10-CM | POA: Diagnosis not present

## 2015-01-23 DIAGNOSIS — E8881 Metabolic syndrome: Secondary | ICD-10-CM

## 2015-01-23 DIAGNOSIS — I2510 Atherosclerotic heart disease of native coronary artery without angina pectoris: Secondary | ICD-10-CM

## 2015-01-23 DIAGNOSIS — Z79899 Other long term (current) drug therapy: Secondary | ICD-10-CM | POA: Diagnosis not present

## 2015-01-23 DIAGNOSIS — Z01818 Encounter for other preprocedural examination: Secondary | ICD-10-CM

## 2015-01-23 NOTE — Patient Instructions (Addendum)
Your physician recommends that you return for lab work FASTING.  Your physician wants you to follow-up in: 1 YEAR OR SOONER IF NEEDED. You will receive a reminder letter in the mail two months in advance. If you don't receive a letter, please call our office to schedule the follow-up appointment.  You have been given clearance to have your shoulder surgery.

## 2015-01-24 ENCOUNTER — Encounter: Payer: Self-pay | Admitting: Cardiovascular Disease

## 2015-01-24 DIAGNOSIS — Z01818 Encounter for other preprocedural examination: Secondary | ICD-10-CM | POA: Insufficient documentation

## 2015-01-24 NOTE — Progress Notes (Signed)
Patient ID: Michael Livingston, male   DOB: 03/16/1961, 54 y.o.   MRN: [id number]     HPI: Michael Livingston is a 54 y.o. male presents to the office for one year cardiology evaluation.  Michael Livingston has known CAD and in October 2005 underwent stenting to his proximal to mid left anterior descending artery. His last catheterization was in October 2007 and his LAD was  was widely patent. He did have mild 30% circumflex stenosis. A nuclear perfusion study in January 2013 continued to show normal perfusion.  Michael Livingston has a history of palpitations which have been controlled with beta blocker therapy. He has a history of hyperlipidemia with an atherogenic lipid panel with low HDL levels. An NMR lipoprofile in 2014 did demonstrate increased insulin resistance and an increased wall LDL particles.  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 Since that time, he has lost over 20 pounds. He denies recent chest pain. He denies significant increase in palpitations except a rare episode.  When  I saw him a year ago he was complaining of atypical chest pain.  Upon further questioning, he denied any clear-cut change in exercise capacity.  He was doing a weedeater to trim his yard and was holding his predominantly with his left arm.  He denies PND or orthopnea.  He denies palpitations.  He has not had laboratory checked in approximately a year.    Since I last saw him, he no longer is working for [employer], but is now owns a [employer].  He denies any exertional chest pain.  He denies palpitations.  He denies PND, orthopnea.  He is in need for surgery on his shoulder due to a left labrum tear and is steadily scheduled for October 13 with Dr. Jeannie Fend.  He presents for evaluation.  Past Medical History  Diagnosis Date  . Heart disease   . Hypertension   . Hyperlipidemia   . Allergy   . Diabetes mellitus without complication     type 2, no meds needed  . Chronic kidney disease     1 kidney stone  . Myocardial infarction     Past Surgical History  Procedure Laterality Date  . Knee arthroscopy      left 2003, right 2005  . Cardiac surgery  2005    stent    Allergies  Allergen Reactions  . Aleve [Naproxen Sodium]     Swollen mouth  . Sulfa Antibiotics     hives    Current Outpatient Prescriptions  Medication Sig Dispense Refill  . aspirin 81 MG tablet Take 81 mg by mouth daily.    . clopidogrel (PLAVIX) 75 MG tablet TAKE 1 TABLET (75 MG TOTAL) BY MOUTH DAILY. 90 tablet 2  . ezetimibe-simvastatin (VYTORIN) 10-40 MG per tablet Take 1 tablet by mouth daily. 90 tablet 3  . metoprolol tartrate (LOPRESSOR) 25 MG tablet Take 25 mg by mouth 2 (two) times daily.    . metoprolol tartrate (LOPRESSOR) 25 MG tablet TAKE 1 TABLET BY MOUTH TWICE A DAY 180 tablet 2  . nitroGLYCERIN (NITROSTAT) 0.4 MG SL tablet Place 1 tablet (0.4 mg total) under the tongue every 5 (five) minutes as needed for chest pain. 25 tablet 1  . omega-3 acid ethyl esters (LOVAZA) 1 G capsule TAKE 1 CAPSULE (1 G TOTAL) BY MOUTH DAILY. 90 capsule 2  . ramipril (ALTACE) 5 MG capsule TAKE 1 CAPSULE (  5 MG TOTAL) BY MOUTH DAILY. 90 capsule 3   No current facility-administered medications for this visit.    Social History   Social History  . Marital Status: Married    Spouse Name: N/A  . Number of Children: N/A  . Years of Education: N/A   Occupational History  . Not on file.   Social History Main Topics  . Smoking status: Former Smoker -- 1.00 packs/day for 10 years    Start date: 07/16/1977    Quit date: 07/17/1987  . Smokeless tobacco: Never Used  . Alcohol Use: No  . Drug Use: No  . Sexual Activity: Not on file   Other Topics Concern  . Not on file   Social History Narrative    Family History  Problem Relation Age of Onset  . Diabetes Mother   . Heart disease Father   . Diabetes Father   . Heart disease Paternal  Grandfather   . Colon cancer Neg Hx   . Esophageal cancer Neg Hx   . Stomach cancer Neg Hx   . Rectal cancer Neg Hx    Social history is notable that he is married and has 2 children. There is no tobacco or alcohol use.   ROS General: Negative; No fevers, chills, or night sweats; there is purposeful weight loss HEENT: Negative; No changes in vision or hearing, sinus congestion, difficulty swallowing Pulmonary: Negative; No cough, wheezing, shortness of breath, hemoptysis Cardiovascular: See history of present illness: No presyncope, syncope, palpitations GI: Negative; No nausea, vomiting, diarrhea, or abdominal pain GU: Negative; No dysuria, hematuria, or difficulty voiding Musculoskeletal: Left shoulder labrum tear Hematologic/Oncology: Negative; no easy bruising, bleeding Endocrine: Negative; no heat/cold intolerance; no diabetes Neuro: Negative; no changes in balance, headaches Skin: Negative; No rashes or skin lesions Psychiatric: Negative; No behavioral problems, depression Sleep: Negative; No snoring, daytime sleepiness, hypersomnolence, bruxism, restless legs, hypnogognic hallucinations, no cataplexy Other comprehensive 14 point system review is negative.   PE BP 126/70 mmHg  Pulse 58  Ht $R'6\' 3"'pC$  (1.905 m)  Wt 219 lb 8 oz (99.565 kg)  BMI 27.44 kg/m2   Wt Readings from Last 3 Encounters:  01/23/15 219 lb 8 oz (99.565 kg)  01/20/14 211 lb 14.4 oz (96.117 kg)  09/09/13 215 lb (97.523 kg)   General: Alert, oriented, no distress.  Skin: normal turgor, no rashes HEENT: Normocephalic, atraumatic. Pupils round and reactive; sclera anicteric;no lid lag.  Nose without nasal septal hypertrophy Mouth/Parynx benign; Mallinpatti scale 2 Neck: No JVD, no carotid bruits .  Normal carotid upstroke Lungs: clear to ausculatation and percussion; no wheezing or rales Chest wall: Nontender to palpation Heart: RRR, s1 s2 normal; no S3 or S4 gallop.  No rubs thrills or heaves. Abdomen:  soft, nontender; no hepatosplenomehaly, BS+; abdominal aorta nontender and not dilated by palpation. Pulses 2+ Back: No CVA tenderness Extremities: no clubbing cyanosis or edema, Homan's sign negative  Neurologic: grossly nonfocal Psychologic: normal affect and mood.  ECG (independently read by me): Sinus bradycardia 58 bpm.  Mild RV conduction delay.  ECG:( Independently read by me): Sinus bradycardia 50 beats per minute.  No ST segment changes.  Normal intervals.  Prior November 2014 ECG: Sinus bradycardia at 45 beats per minute. No significant ST-T change.  LABS: BMP Latest Ref Rng 01/20/2014  Glucose 70 - 99 mg/dL 101(H)  BUN 6 - 23 mg/dL 14  Creatinine 0.50 - 1.35 mg/dL 0.86  Sodium 135 - 145 mEq/L 138  Potassium 3.5 - 5.3 mEq/L  4.6  Chloride 96 - 112 mEq/L 101  CO2 19 - 32 mEq/L 24  Calcium 8.4 - 10.5 mg/dL 9.9   Hepatic Function Latest Ref Rng 01/20/2014  Total Protein 6.0 - 8.3 g/dL 7.3  Albumin 3.5 - 5.2 g/dL 4.7  AST 0 - 37 U/L 19  ALT 0 - 53 U/L 17  Alk Phosphatase 39 - 117 U/L 54  Total Bilirubin 0.2 - 1.2 mg/dL 1.2   CBC Latest Ref Rng 01/20/2014 04/02/2013  WBC 4.0 - 10.5 K/uL 6.4 5.1  Hemoglobin 13.0 - 17.0 g/dL 15.6 15.2  Hematocrit 39.0 - 52.0 % 43.8 42.6  Platelets 150 - 400 K/uL 217 201   Lab Results  Component Value Date   MCV 88.8 01/20/2014   MCV 91.0 04/02/2013   Lab Results  Component Value Date   TSH 1.676 01/20/2014   Lab Results  Component Value Date   HGBA1C 6.0* 01/20/2014   Lipid Panel     Component Value Date/Time   CHOL 102 01/20/2014 1009   CHOL 99 04/02/2013 0903   TRIG 121 01/20/2014 1009   TRIG 59 04/02/2013 0903   HDL 38* 01/20/2014 1009   HDL 42 04/02/2013 0903   CHOLHDL 2.7 01/20/2014 1009   VLDL 24 01/20/2014 1009   LDLCALC 40 01/20/2014 1009   LDLCALC 45 04/02/2013 0903     RADIOLOGY: No results found.    ASSESSMENT AND PLAN: Mr. Nyjah Denio is a 54 year old white male who is now 11 years status post  stenting to his proximal to mid LAD in October 2005.  At his last catheterization in October 2007 his LAD was widely patent and he had mild 30% narrowing.  A nuclear perfusion study in January 2013 continued to show normal perfusion.  Presently, he is without anginal symptomatology.  His ECG remained stable.  His blood pressure is well controlled on ramipril 5 mg and metoprolol, tartrate 25 mg twice a day.  He is on Vytorin 10/40 for hyperlipidemia.  He has continued to be on dual platelet therapy.  I have suggested that he hold his Plavix for at least 5 days prior to his surgery and if aspirin needs to be held, this can be held as well since he has been clinically stable from over a decade.  He will be given clearance for surgery.  Laboratory will be checked in the fasting state to make certain he still aggressively being managed with reference to his lipid studies and other parameters.  As long as he is stable, I will see him in one year for reevaluation or sooner if problems arise.  Time spent: 25 minutes Troy Sine, MD, [facility]  01/24/2015 10:24 PM

## 2015-01-30 ENCOUNTER — Other Ambulatory Visit: Payer: Self-pay | Admitting: *Deleted

## 2015-01-30 ENCOUNTER — Other Ambulatory Visit: Payer: Self-pay | Admitting: Cardiovascular Disease

## 2015-01-30 NOTE — Telephone Encounter (Signed)
REILL

## 2015-02-06 LAB — LIPID PANEL
CHOLESTEROL: 87 mg/dL — AB (ref 125–200)
HDL: 32 mg/dL — ABNORMAL LOW (ref >=40)
LDL Cholesterol: 39 mg/dL (ref <130)
TRIGLYCERIDES: 80 mg/dL (ref <150)
Total CHOL/HDL Ratio: 2.7 Ratio (ref <=5.0)
VLDL: 16 mg/dL (ref <30)

## 2015-02-06 LAB — COMPREHENSIVE METABOLIC PANEL
ALBUMIN: 4.7 g/dL (ref 3.6–5.1)
ALK PHOS: 60 U/L (ref 40–115)
ALT: 14 U/L (ref 9–46)
AST: 16 U/L (ref 10–35)
BUN: 11 mg/dL (ref 7–25)
CALCIUM: 9.5 mg/dL (ref 8.6–10.3)
CO2: 29 mmol/L (ref 20–31)
Chloride: 105 mmol/L (ref 98–110)
Creat: 0.82 mg/dL (ref 0.70–1.33)
Glucose, Bld: 130 mg/dL — ABNORMAL HIGH (ref 65–99)
POTASSIUM: 4.7 mmol/L (ref 3.5–5.3)
Sodium: 140 mmol/L (ref 135–146)
TOTAL PROTEIN: 7.2 g/dL (ref 6.1–8.1)
Total Bilirubin: 1 mg/dL (ref 0.2–1.2)

## 2015-02-06 LAB — CBC
HEMATOCRIT: 44.7 % (ref 39.0–52.0)
HEMOGLOBIN: 14.9 g/dL (ref 13.0–17.0)
MCH: 31.2 pg (ref 26.0–34.0)
MCHC: 33.3 g/dL (ref 30.0–36.0)
MCV: 93.5 fL (ref 78.0–100.0)
MPV: 9.2 fL (ref 8.6–12.4)
Platelets: 195 10*3/uL (ref 150–400)
RBC: 4.78 MIL/uL (ref 4.22–5.81)
RDW: 13.1 % (ref 11.5–15.5)
WBC: 4.7 10*3/uL (ref 4.0–10.5)

## 2015-02-06 LAB — HEMOGLOBIN A1C
HEMOGLOBIN A1C: 6.1 % — AB (ref <5.7)
Mean Plasma Glucose: 128 mg/dL — ABNORMAL HIGH (ref <117)

## 2015-02-06 LAB — TSH: TSH: 1.635 u[IU]/mL (ref 0.350–4.500)

## 2015-02-09 ENCOUNTER — Encounter (HOSPITAL_BASED_OUTPATIENT_CLINIC_OR_DEPARTMENT_OTHER): Payer: Self-pay | Admitting: Emergency Medicine

## 2015-02-09 ENCOUNTER — Telehealth: Payer: Self-pay | Admitting: Cardiovascular Disease

## 2015-02-09 ENCOUNTER — Emergency Department (HOSPITAL_BASED_OUTPATIENT_CLINIC_OR_DEPARTMENT_OTHER): Payer: BLUE CROSS/BLUE SHIELD

## 2015-02-09 ENCOUNTER — Emergency Department (HOSPITAL_BASED_OUTPATIENT_CLINIC_OR_DEPARTMENT_OTHER)
Admission: EM | Admit: 2015-02-09 | Discharge: 2015-02-09 | Disposition: A | Discharge status: Home / Self Care | Payer: BLUE CROSS/BLUE SHIELD | Attending: Emergency Medicine | Admitting: Emergency Medicine

## 2015-02-09 DIAGNOSIS — N189 Chronic kidney disease, unspecified: Secondary | ICD-10-CM | POA: Insufficient documentation

## 2015-02-09 DIAGNOSIS — R109 Unspecified abdominal pain: Secondary | ICD-10-CM

## 2015-02-09 DIAGNOSIS — Z87442 Personal history of urinary calculi: Secondary | ICD-10-CM | POA: Diagnosis not present

## 2015-02-09 DIAGNOSIS — I252 Old myocardial infarction: Secondary | ICD-10-CM | POA: Diagnosis not present

## 2015-02-09 DIAGNOSIS — R200 Anesthesia of skin: Secondary | ICD-10-CM | POA: Insufficient documentation

## 2015-02-09 DIAGNOSIS — Z7982 Long term (current) use of aspirin: Secondary | ICD-10-CM | POA: Diagnosis not present

## 2015-02-09 DIAGNOSIS — Z87891 Personal history of nicotine dependence: Secondary | ICD-10-CM | POA: Insufficient documentation

## 2015-02-09 DIAGNOSIS — E785 Hyperlipidemia, unspecified: Secondary | ICD-10-CM | POA: Insufficient documentation

## 2015-02-09 DIAGNOSIS — Z79899 Other long term (current) drug therapy: Secondary | ICD-10-CM | POA: Insufficient documentation

## 2015-02-09 DIAGNOSIS — N200 Calculus of kidney: Secondary | ICD-10-CM | POA: Insufficient documentation

## 2015-02-09 DIAGNOSIS — M5416 Radiculopathy, lumbar region: Secondary | ICD-10-CM | POA: Insufficient documentation

## 2015-02-09 DIAGNOSIS — I129 Hypertensive chronic kidney disease with stage 1 through stage 4 chronic kidney disease, or unspecified chronic kidney disease: Secondary | ICD-10-CM | POA: Insufficient documentation

## 2015-02-09 LAB — BASIC METABOLIC PANEL
Anion gap: 9 (ref 5–15)
BUN: 17 mg/dL (ref 6–20)
CALCIUM: 9.6 mg/dL (ref 8.9–10.3)
CO2: 23 mmol/L (ref 22–32)
CREATININE: 1.04 mg/dL (ref 0.61–1.24)
Chloride: 106 mmol/L (ref 101–111)
GFR calc Af Amer: >60 mL/min (ref >60)
Glucose, Bld: 165 mg/dL — ABNORMAL HIGH (ref 65–99)
POTASSIUM: 3.8 mmol/L (ref 3.5–5.1)
SODIUM: 138 mmol/L (ref 135–145)

## 2015-02-09 LAB — CBC WITH DIFFERENTIAL/PLATELET
Basophils Absolute: 0 10*3/uL (ref 0.0–0.1)
Basophils Relative: 0 %
EOS ABS: 0.2 10*3/uL (ref 0.0–0.7)
EOS PCT: 3 %
HCT: 41.2 % (ref 39.0–52.0)
Hemoglobin: 14.3 g/dL (ref 13.0–17.0)
LYMPHS ABS: 1.4 10*3/uL (ref 0.7–4.0)
Lymphocytes Relative: 16 %
MCH: 31.4 pg (ref 26.0–34.0)
MCHC: 34.7 g/dL (ref 30.0–36.0)
MCV: 90.4 fL (ref 78.0–100.0)
Monocytes Absolute: 0.6 10*3/uL (ref 0.1–1.0)
Monocytes Relative: 7 %
Neutro Abs: 6.4 10*3/uL (ref 1.7–7.7)
Neutrophils Relative %: 74 %
PLATELETS: 183 10*3/uL (ref 150–400)
RBC: 4.56 MIL/uL (ref 4.22–5.81)
RDW: 12.2 % (ref 11.5–15.5)
WBC: 8.7 10*3/uL (ref 4.0–10.5)

## 2015-02-09 LAB — URINALYSIS, ROUTINE W REFLEX MICROSCOPIC
BILIRUBIN URINE: NEGATIVE
Glucose, UA: NEGATIVE mg/dL
HGB URINE DIPSTICK: NEGATIVE
Ketones, ur: 15 mg/dL — AB
Leukocytes, UA: NEGATIVE
Nitrite: NEGATIVE
PROTEIN: NEGATIVE mg/dL
Specific Gravity, Urine: 1.022 (ref 1.005–1.030)
UROBILINOGEN UA: 1 mg/dL (ref 0.0–1.0)
pH: 7 (ref 5.0–8.0)

## 2015-02-09 LAB — CBG MONITORING, ED: GLUCOSE-CAPILLARY: 129 mg/dL — AB (ref 65–99)

## 2015-02-09 NOTE — ED Provider Notes (Addendum)
CSN: [id number]     Arrival date & time 02/09/15  1557 History  By signing my name below, I, Soijett Blue, attest that this documentation has been prepared under the direction and in the presence of Blanchie Dessert, MD. Electronically Signed: Soijett Blue, ED Scribe. 02/09/2015. 4:19 PM.   Chief Complaint  Patient presents with  . Abdominal Pain     The history is provided by the patient. No language interpreter was used.    HPI Comments: Michael Livingston is a 54 y.o. male with a medical hx of chronic kidney disease, DM, HTN who presents to the Emergency Department complaining of severe worsening right lower back pain onset 2-3 days worsening today. He reports that 1 hour ago PTA his right lower back pain radiated to his RLQ abdomen with associated "retching" x 3 episodes. He notes that he has had a  Hx of kidney stones 10-15 years ago that passed on its own. He states that he is having associated symptoms of nausea, vomiting x 3 episodes, hematuria x yesterday with none today, right lower back pain, numbness over right hip and lower abdomen. He states that he has tried OTC anti-inflammatory at noon with no relief for his symptoms. He denies fever, constipation, diarrhea, and any other symptoms. He reports that he is allergic to sulfa and aleve.   Past Medical History  Diagnosis Date  . Heart disease   . Hypertension   . Hyperlipidemia   . Allergy   . Diabetes mellitus without complication (HCC)     type 2, no meds needed  . Chronic kidney disease     1 kidney stone  . Myocardial infarction [facility])    Past Surgical History  Procedure Laterality Date  . Knee arthroscopy      left 2003, right 2005  . Cardiac surgery  2005    stent   Family History  Problem Relation Age of Onset  . Diabetes Mother   . Heart disease Father   . Diabetes Father   . Heart disease Paternal Grandfather   . Colon cancer Neg Hx   . Esophageal cancer Neg Hx   . Stomach cancer Neg Hx   . Rectal cancer Neg  Hx    Social History  Substance Use Topics  . Smoking status: Former Smoker -- 1.00 packs/day for 10 years    Start date: 07/16/1977    Quit date: 07/17/1987  . Smokeless tobacco: Never Used  . Alcohol Use: No    Review of Systems  Constitutional: Negative for fever.  Gastrointestinal: Positive for nausea, vomiting and abdominal pain. Negative for diarrhea and constipation.  Genitourinary: Positive for hematuria.  Musculoskeletal: Positive for back pain.  Neurological: Positive for numbness (right hip and right upper leg).      Allergies  Aleve and Sulfa antibiotics  Home Medications   Prior to Admission medications   Medication Sig Start Date End Date Taking? Authorizing Provider  aspirin 81 MG tablet Take 81 mg by mouth daily.   Yes Historical Provider, MD  clopidogrel (PLAVIX) 75 MG tablet TAKE 1 TABLET (75 MG TOTAL) BY MOUTH DAILY. 05/20/14  Yes Troy Sine, MD  ezetimibe-simvastatin (VYTORIN) 10-40 MG per tablet Take 1 tablet by mouth daily. 02/21/14  Yes Troy Sine, MD  metoprolol tartrate (LOPRESSOR) 25 MG tablet TAKE 1 TABLET BY MOUTH TWICE A DAY 01/30/15  Yes Troy Sine, MD  omega-3 acid ethyl esters (LOVAZA) 1 G capsule TAKE 1 CAPSULE (1 G TOTAL) BY MOUTH  DAILY. 05/20/14  Yes Troy Sine, MD  nitroGLYCERIN (NITROSTAT) 0.4 MG SL tablet Place 1 tablet (0.4 mg total) under the tongue every 5 (five) minutes as needed for chest pain. 01/16/14   Troy Sine, MD  ramipril (ALTACE) 5 MG capsule TAKE 1 CAPSULE (5 MG TOTAL) BY MOUTH DAILY. 02/21/14   Troy Sine, MD   BP 152/67 mmHg  Pulse 64  Temp(Src) 98.9 F (37.2 C) (Oral)  Resp 20  Ht 6' (1.829 m)  Wt 216 lb (97.977 kg)  BMI 29.29 kg/m2  SpO2 100% Physical Exam  Constitutional: He is oriented to person, place, and time. He appears well-developed and well-nourished. No distress.  HENT:  Head: Normocephalic and atraumatic.  Eyes: EOM are normal.  Neck: Neck supple.  Cardiovascular: Normal rate,  regular rhythm and normal heart sounds.  Exam reveals no gallop and no friction rub.   No murmur heard. Pulmonary/Chest: Effort normal and breath sounds normal. No respiratory distress. He has no wheezes. He has no rales.  Abdominal: Soft. There is tenderness in the right lower quadrant. There is CVA tenderness. There is no rebound and no guarding.  Mild right CVA tenderness. RLQ tenderness  Musculoskeletal: Normal range of motion.  Neurological: He is alert and oriented to person, place, and time.  Skin: Skin is warm and dry.  Psychiatric: He has a normal mood and affect. His behavior is normal.  Nursing note and vitals reviewed.   ED Course  Procedures (including critical care time) DIAGNOSTIC STUDIES: Oxygen Saturation is 100% on RA, nl by my interpretation.    COORDINATION OF CARE: 4:18 PM Discussed treatment plan with pt at bedside which includes UA, CT renal stone study, and labs and pt agreed to plan.    Labs Review Labs Reviewed  URINALYSIS, ROUTINE W REFLEX MICROSCOPIC (NOT AT [facility]) - Abnormal; Notable for the following:    Ketones, ur 15 (*)    All other components within normal limits  CBC WITH DIFFERENTIAL/PLATELET  BASIC METABOLIC PANEL    Imaging Review Ct Renal Stone Study  02/09/2015   CLINICAL DATA:  Severe RIGHT flank pain for 3 days, dark urine, also having RIGHT-side numbness and tingling in RIGHT leg, history kidney stones, hypertension, hyperlipidemia, diabetes mellitus, MI, former smoker  EXAM: CT ABDOMEN AND PELVIS WITHOUT CONTRAST  TECHNIQUE: Multidetector CT imaging of the abdomen and pelvis was performed following the standard protocol without IV contrast. Sagittal and coronal MPR images reconstructed from axial data set. Oral contrast not administered for this indication.  COMPARISON:  None  FINDINGS: Lung bases clear.  No urinary tract calcification, hydronephrosis or ureteral dilatation.  Within limits of a nonenhanced exam no focal abnormalities of the  liver, gallbladder, spleen, pancreas, kidneys, or adrenal glands.  Normal appendix.  Normal appearing bladder, ureters and prostate gland.  Stomach and bowel loops normal appearance.  No mass, adenopathy, free air, free fluid or inflammatory process.  No acute osseous findings or hernia identified.  IMPRESSION: Normal noncontrast CT abdomen and pelvis exam.   Electronically Signed   By: Lavonia Dana M.D.   On: 02/09/2015 16:41   I have personally reviewed and evaluated these images and lab results as part of my medical decision-making.   EKG Interpretation None      MDM   Final diagnoses:  Kidney stone  Lumbar radiculopathy, acute    Pt with symptoms consistent with kidney stone that started about 1 hour pta today.  Denies infectious sx, or GI symptoms.  Low concern for diverticulitis and no risk factors or history suggestive of AAA.  No hx suggestive of GU source (discharge) and otherwise pt is healthy.  Pt's pain is starting to resolved and may have passed stone.  However pt states for the last 3-4 days he has had right low back pain that radiates up and down the back and now having some numbness in the right leg without weakness.  Labs without acute findings and CT neg for stone.  On re-eval pt has minimal pain.  Will d/c home with lumbar radiculopathy and passed kidney stone.  I personally performed the services described in this documentation, which was scribed in my presence.  The recorded information has been reviewed and considered.   7:28 PM Patient's wife called back stating that he was having firmness and numbness in the right midabdomen. Denied any pain or skin changes over the phone. Patient was called in [place] and wife given instruction if he starts developing weakness of the leg skin color changes or other concerning symptoms to seek further evaluation but if not to follow-up with his orthopedist for an MRI of his back tomorrow.  Blanchie Dessert, MD 02/09/15  [place], MD 02/09/15 [place], MD 02/09/15 1929

## 2015-02-09 NOTE — ED Notes (Signed)
Pt c/o RLQ abdominal pain increasing in the last hour with N/V. Denies SOB, chest pain or fever.

## 2015-02-09 NOTE — ED Notes (Signed)
Pt c/o feeling "woozy"- HX of diabetes and states he hasn't eaten recently- CBG 129- EDP notified

## 2015-02-09 NOTE — ED Notes (Signed)
D/c home with family to drive. Urine strainer given for home use. No new Rx given

## 2015-02-09 NOTE — ED Notes (Signed)
Pt given snack, OJ, graham crackers, peanut butter, cheese

## 2015-02-09 NOTE — Telephone Encounter (Signed)
Pt's daughter called in stating that he has some blood work done yesterday and was calling to see if those results were available. She is currently with the pt in the ER right now. Please f/u  Thanks

## 2015-02-09 NOTE — ED Notes (Signed)
Patient transported to CT 

## 2015-02-09 NOTE — ED Notes (Signed)
MD at bedside. 

## 2015-02-09 NOTE — Telephone Encounter (Signed)
Pt informed & aware of labs results.

## 2015-02-10 ENCOUNTER — Ambulatory Visit (INDEPENDENT_AMBULATORY_CARE_PROVIDER_SITE_OTHER): Payer: BLUE CROSS/BLUE SHIELD | Admitting: Family Medicine

## 2015-02-10 ENCOUNTER — Encounter: Payer: Self-pay | Admitting: Family Medicine

## 2015-02-10 VITALS — BP 139/69 | HR 60 | Temp 98.3°F | Ht 72.0 in | Wt 218.0 lb

## 2015-02-10 DIAGNOSIS — M545 Low back pain: Secondary | ICD-10-CM

## 2015-02-10 MED ORDER — TRAMADOL HCL 50 MG PO TABS: 50.0000 mg | ORAL_TABLET | Freq: Four times a day (QID) | ORAL | Status: DC | PRN

## 2015-02-10 NOTE — Progress Notes (Signed)
   Subjective:    Patient ID: Rayner Erman, male    DOB: 10/17/1960, 54 y.o.   MRN: [id number]  HPI Here to follow up an ER visit last evening for right flank pain. He has never been bothered by back pain until about 3 days ago when he developed stiffness and pain in the right lower back. No recent trauma, but he notes that he had a Worker's Comp injury on 10-16-14 when he fell off a truck and landed with his left arm extended. Of course this jerked his entire body around but the main focus of injury was the left shoulder. He ended up seeing Dr. Roseanne Kaufman for this and was diagnosed with a labrum tear. He is scheduled for surgery on this next week I believe. As far as the back pain, this worsened throughout the day yesterday and became quite severe in the early evening. The pain radiated around the right flank toward the groin area. No urinary symptoms and no bowel changes. No fever or nausea. He thought this may have been from a kidney stone since he has passed one before, but when he was worked up in the ER his CT scan was negative. No stones were seen, and his appendix was normal. No intra-abdominal causes were seen. He was sent home with a few Norco and some muscle relaxers. Today the back pain has lessened a bit, but he feels muscle spasms in the right lower abdomen and the entire RLQ of the abdomen and the right upper thigh are numb. No weakness in the legs.    Review of Systems  Constitutional: Negative.   Respiratory: Negative.   Cardiovascular: Negative.   Gastrointestinal: Negative.   Genitourinary: Positive for flank pain. Negative for dysuria, urgency, frequency, hematuria and testicular pain.  Musculoskeletal: Positive for back pain.  Neurological: Positive for numbness.       Objective:   Physical Exam  Constitutional: He appears well-developed and well-nourished. No distress.  He walks and gets on the exam table easily   Cardiovascular: Normal rate, regular rhythm, normal heart  sounds and intact distal pulses.   Pulmonary/Chest: Effort normal and breath sounds normal.  Abdominal: Soft. Bowel sounds are normal. He exhibits no distension and no mass. There is no tenderness. There is no rebound and no guarding.  He does have some muscle spasms in the RLQ   Musculoskeletal:  The right lower back has some spasms but is not tender. ROM is full. Negative SLR          Assessment & Plan:  This seems be muscle spasms from a pinched nerve in the right lower back. This would account for the pain and for the numbness. He asks if this could be related to the fall he took in June, and while I said that would be unlikely it is not impossible. We decided to give this some time to see if it settles down on its own. He can use heat, Flexeril, and Tramadol prn. If it does not improve, I suggested he see Dr. Amedeo Plenty about it. That way it could possibly be covered as a Worker's Comp injury.

## 2015-02-10 NOTE — Progress Notes (Signed)
Pre visit review using our clinic review tool, if applicable. No additional management support is needed unless otherwise documented below in the visit note. 

## 2015-02-13 ENCOUNTER — Other Ambulatory Visit: Payer: Self-pay | Admitting: Cardiovascular Disease

## 2015-02-23 ENCOUNTER — Telehealth: Payer: Self-pay | Admitting: Cardiovascular Disease

## 2015-02-23 NOTE — Telephone Encounter (Signed)
Michael Livingston is calling to request patient come off of his plavix  Before his injection on 03/11/15 . Please call   Thanks

## 2015-02-23 NOTE — Telephone Encounter (Signed)
Returned call to [employer] with [place] calling to ask Dr.Kelly if ok for patient to hold plavix before root block back injection.Message sent to [facility] for advice.

## 2015-02-23 NOTE — Telephone Encounter (Signed)
Would hold plavix for at least 5 days, but with spinal injection prob ok to hold for 6 days

## 2015-02-23 NOTE — Telephone Encounter (Signed)
Note faxed to [facility] with [place] at fax # [phone].

## 2015-03-13 ENCOUNTER — Telehealth: Payer: Self-pay | Admitting: *Deleted

## 2015-03-13 NOTE — Telephone Encounter (Signed)
Faxed surgical clearance to [facility] for shoulder surgery.

## 2015-03-27 ENCOUNTER — Ambulatory Visit: Payer: Self-pay | Admitting: Family Medicine

## 2015-04-10 ENCOUNTER — Encounter: Payer: Self-pay | Admitting: Family Medicine

## 2015-04-10 ENCOUNTER — Ambulatory Visit (INDEPENDENT_AMBULATORY_CARE_PROVIDER_SITE_OTHER): Payer: BLUE CROSS/BLUE SHIELD | Admitting: Family Medicine

## 2015-04-10 VITALS — BP 136/74 | HR 61 | Temp 98.4°F | Ht 72.0 in | Wt 221.0 lb

## 2015-04-10 DIAGNOSIS — L0292 Furuncle, unspecified: Secondary | ICD-10-CM | POA: Diagnosis not present

## 2015-04-10 MED ORDER — DOXYCYCLINE HYCLATE 100 MG PO CAPS: 100.0000 mg | ORAL_CAPSULE | Freq: Two times a day (BID) | ORAL | Status: AC

## 2015-04-10 NOTE — Progress Notes (Signed)
Pre visit review using our clinic review tool, if applicable. No additional management support is needed unless otherwise documented below in the visit note. 

## 2015-04-10 NOTE — Progress Notes (Signed)
   Subjective:    Patient ID: Michael Livingston, male    DOB: May 11, 1960, 54 y.o.   MRN: [id number]  HPI Here for 2 weeks of boils in the right armpit, and 2 days ago a similar boil came up on the left hand.  Ne fever. He is scheduled for spinal surgery on 04-21-15.    Review of Systems  Constitutional: Negative.   Respiratory: Negative.   Cardiovascular: Negative.   Skin: Positive for wound.       Objective:   Physical Exam  Constitutional: He appears well-developed and well-nourished.  Cardiovascular: Normal rate, regular rhythm, normal heart sounds and intact distal pulses.   Pulmonary/Chest: Effort normal and breath sounds normal.  Skin:  The right axilla has 2 tender boils, and the left dorsal hand has a smaller boil.           Assessment & Plan:  Multiple boils, most likely due to MRSA. The largest axillary boil yielded some whitish fluid with pressure, and a sample of this was sent for a culture. We will start him on Doxycycline for 30 days. I asked him to notify his surgeon about this, since I feel sure they will want to postpone his surgery if a MRSA infection is suspected.

## 2015-04-13 ENCOUNTER — Telehealth: Payer: Self-pay | Admitting: Family Medicine

## 2015-04-13 NOTE — Telephone Encounter (Signed)
Please find the results of his wound culture

## 2015-04-13 NOTE — Telephone Encounter (Signed)
Pt was seen on 04/10/15  By dr fry and would like result of culture

## 2015-04-13 NOTE — Telephone Encounter (Signed)
Per Dr. Sarajane Jews results are back and no MRSA however it is a staph infection, recent antibiotic given to cover this. I spoke with pt and went over this information and pt will notify the surgeon.

## 2015-05-18 ENCOUNTER — Ambulatory Visit (INDEPENDENT_AMBULATORY_CARE_PROVIDER_SITE_OTHER): Payer: BLUE CROSS/BLUE SHIELD | Admitting: Family Medicine

## 2015-05-18 ENCOUNTER — Encounter: Payer: Self-pay | Admitting: Family Medicine

## 2015-05-18 VITALS — BP 120/84 | HR 59 | Temp 98.2°F | Ht 72.0 in | Wt 221.1 lb

## 2015-05-18 DIAGNOSIS — L02512 Cutaneous abscess of left hand: Secondary | ICD-10-CM | POA: Diagnosis not present

## 2015-05-18 DIAGNOSIS — L02411 Cutaneous abscess of right axilla: Secondary | ICD-10-CM

## 2015-05-18 NOTE — Progress Notes (Signed)
   Subjective:    Patient ID: Michael Livingston, male    DOB: 1961/02/03, 55 y.o.   MRN: [id number]  HPI Patient seen for follow-up recent abscess right axilla and dorsum left hand He was seen here back in early December. Right axillary abscess was draining spontaneously and culture grew out non-MRSA staph susceptible to tetracycline. Patient was treated with a full 30 days of doxycycline. Right axillary swelling and erythema have fully resolved. He still has some mild induration dorsum of left hand. Denies any fevers or chills. No history of MRSA.  Wife apparently back in December also had abscess left side of neck.  Patient is trying to get scheduled for surgery left shoulder for labrum tear.  Past Medical History  Diagnosis Date  . Heart disease   . Hypertension   . Hyperlipidemia   . Allergy   . Diabetes mellitus without complication (HCC)     type 2, no meds needed  . Chronic kidney disease     1 kidney stone  . Myocardial infarction [facility])    Past Surgical History  Procedure Laterality Date  . Knee arthroscopy      left 2003, right 2005  . Cardiac surgery  2005    stent    reports that he quit smoking about 27 years ago. He started smoking about 37 years ago. He has never used smokeless tobacco. He reports that he does not drink alcohol or use illicit drugs. family history includes Diabetes in his father and mother; Heart disease in his father and paternal grandfather. There is no history of Colon cancer, Esophageal cancer, Stomach cancer, or Rectal cancer. Allergies  Allergen Reactions  . Aleve [Naproxen Sodium]     Swollen mouth  . Sulfa Antibiotics     hives      Review of Systems  Constitutional: Negative for fever and chills.  Gastrointestinal: Negative for nausea and vomiting.       Objective:   Physical Exam  Constitutional: He appears well-developed and well-nourished.  Cardiovascular: Normal rate and regular rhythm.   Pulmonary/Chest: Effort normal and  breath sounds normal. No respiratory distress. He has no wheezes. He has no rales.  Skin:  Right axilla-no abscess and no induration Left hand dorsally reveals small area minimal induration approximately 1 cm diameter. No fluctuance. No warmth. Nontender.          Assessment & Plan:  Resolving abscess right axilla and dorsum left hand. Culture grew out non-MRSA staph. Patient completed full 30 days of doxycycline. He is in process of being scheduled for left shoulder labrum surgery. Will discuss with his surgeon

## 2015-05-18 NOTE — Progress Notes (Signed)
Pre visit review using our clinic review tool, if applicable. No additional management support is needed unless otherwise documented below in the visit note. 

## 2015-05-18 NOTE — Patient Instructions (Signed)
Follow up for any increased redness, warmth, or fever.

## 2015-05-25 ENCOUNTER — Telehealth: Payer: Self-pay | Admitting: Family Medicine

## 2015-05-25 MED ORDER — DOXYCYCLINE HYCLATE 100 MG PO TABS: 100.0000 mg | ORAL_TABLET | Freq: Two times a day (BID) | ORAL | Status: DC

## 2015-05-25 NOTE — Telephone Encounter (Signed)
Start back Doxycycline 100 mg po bid for 14 days.

## 2015-05-25 NOTE — Telephone Encounter (Signed)
Should we get him back to re evaluate?

## 2015-05-25 NOTE — Telephone Encounter (Signed)
Pt is aware via voicemail that RX has been sent into the pharmacy.

## 2015-05-25 NOTE — Telephone Encounter (Signed)
Pt call to say that the spot under his arm has flared up and he finish the medicine and it was looking better. He said he has a knot and a red spot. Asking if he need to come in or if you will call him in another round of medicine.

## 2015-05-26 ENCOUNTER — Telehealth: Payer: Self-pay | Admitting: Family Medicine

## 2015-05-26 NOTE — Telephone Encounter (Signed)
After finishing the Doxycycline prescription that doctor Sarajane Jews gave him and again when Dr. Elease Hashimoto gave it to him, the redness goes away, but when he finishes the medication the knot came back again. He's thinking he needs something stronger.

## 2015-05-26 NOTE — Telephone Encounter (Signed)
The staph that was cultured out was susceptible to Doxycycline so "strength" of antibiotic should not have been an issue.  Nevertheless, since this was NOT MRSA, I think we could try Keflex 500 mg po tid for 10 days.

## 2015-05-27 MED ORDER — CEPHALEXIN 500 MG PO CAPS: 500.0000 mg | ORAL_CAPSULE | Freq: Three times a day (TID) | ORAL | Status: DC

## 2015-05-27 NOTE — Telephone Encounter (Signed)
Pt declines new antibiotic. He went to a dermatologist yesterday and was treated with RX soaps, creams and increased his doxy dose.

## 2015-07-21 ENCOUNTER — Other Ambulatory Visit: Payer: Self-pay | Admitting: Cardiovascular Disease

## 2015-07-21 NOTE — Telephone Encounter (Signed)
REFILL 

## 2015-10-19 ENCOUNTER — Telehealth: Payer: Self-pay | Admitting: Cardiovascular Disease

## 2015-10-19 NOTE — Telephone Encounter (Signed)
New Message  Pt calling to schedule appt with only Dr. Claiborne Billings. No appt avail. Pt wanted appt before oct. Please call back to discuss

## 2015-10-19 NOTE — Telephone Encounter (Signed)
Returned call to pt. Appt available tomorrow. Patient said yes. Appt 10/20/15 at 11:45am.

## 2015-10-20 ENCOUNTER — Ambulatory Visit (INDEPENDENT_AMBULATORY_CARE_PROVIDER_SITE_OTHER): Payer: BLUE CROSS/BLUE SHIELD | Admitting: Cardiovascular Disease

## 2015-10-20 ENCOUNTER — Encounter: Payer: Self-pay | Admitting: Cardiovascular Disease

## 2015-10-20 VITALS — BP 110/60 | HR 58 | Ht 74.0 in | Wt 219.2 lb

## 2015-10-20 DIAGNOSIS — I2510 Atherosclerotic heart disease of native coronary artery without angina pectoris: Secondary | ICD-10-CM | POA: Diagnosis not present

## 2015-10-20 DIAGNOSIS — E119 Type 2 diabetes mellitus without complications: Secondary | ICD-10-CM

## 2015-10-20 DIAGNOSIS — Z79899 Other long term (current) drug therapy: Secondary | ICD-10-CM | POA: Diagnosis not present

## 2015-10-20 DIAGNOSIS — E782 Mixed hyperlipidemia: Secondary | ICD-10-CM | POA: Diagnosis not present

## 2015-10-20 DIAGNOSIS — E8881 Metabolic syndrome: Secondary | ICD-10-CM

## 2015-10-20 NOTE — Patient Instructions (Signed)
Your physician recommends that you return for lab work fasting.   Your physician wants you to follow-up in: 1 year or sooner if needed. You will receive a reminder letter in the mail two months in advance. If you don't receive a letter, please call our office to schedule the follow-up appointment.  If you need a refill on your cardiac medications before your next appointment, please call your pharmacy.    

## 2015-10-22 ENCOUNTER — Encounter: Payer: Self-pay | Admitting: Cardiovascular Disease

## 2015-10-22 LAB — CBC
HCT: 42.1 % (ref 38.5–50.0)
HEMOGLOBIN: 14.7 g/dL (ref 13.2–17.1)
MCH: 31.9 pg (ref 27.0–33.0)
MCHC: 34.9 g/dL (ref 32.0–36.0)
MCV: 91.3 fL (ref 80.0–100.0)
MPV: 9.1 fL (ref 7.5–12.5)
Platelets: 201 10*3/uL (ref 140–400)
RBC: 4.61 MIL/uL (ref 4.20–5.80)
RDW: 13.5 % (ref 11.0–15.0)
WBC: 4.8 10*3/uL (ref 3.8–10.8)

## 2015-10-22 LAB — TSH: TSH: 1.78 m[IU]/L (ref 0.40–4.50)

## 2015-10-22 NOTE — Progress Notes (Signed)
Patient ID: Michael Livingston, male   DOB: Aug 11, 1960, 55 y.o.   MRN: [id number]     HPI: Michael Livingston is a 55 y.o. male presents to the office for a 9 month cardiology evaluation.  [name] has CAD and in October 2005 underwent stenting to his proximal to mid left anterior descending artery. His last catheterization was in October 2007 and his LAD was  was widely patent. He did have mild 30% circumflex stenosis. A nuclear perfusion study in January 2013 continued to show normal perfusion.  [name] has a history of palpitations which have been controlled with beta blocker therapy. He has a history of hyperlipidemia with an atherogenic lipid panel with low HDL levels. An NMR lipoprofile in 2014 demonstrated increased insulin resistance and an increased wall LDL particles.  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 Since that time, he has lost over 20 pounds. He denies recent chest pain. He denies significant increase in palpitations except a rare episode.   He is no longer is working for [employer], but is now owns a [employer].  He denies any exertional chest pain or dyspnea.  He denies palpitations.  He denies PND, orthopnea.  Last year he underwent orthopedic surgery to his shoulder and tolerated this well.  He presents for follow-up evaluation.   Past Medical History  Diagnosis Date  . Heart disease   . Hypertension   . Hyperlipidemia   . Allergy   . Diabetes mellitus without complication (HCC)     type 2, no meds needed  . Chronic kidney disease     1 kidney stone  . Myocardial infarction [facility])     Past Surgical History  Procedure Laterality Date  . Knee arthroscopy      left 2003, right 2005  . Cardiac surgery  2005    stent    Allergies  Allergen Reactions  . Aleve [Naproxen Sodium]     Swollen mouth  . Sulfa Antibiotics     hives    Current Outpatient Prescriptions    Medication Sig Dispense Refill  . aspirin 81 MG tablet Take 81 mg by mouth daily.    . clopidogrel (PLAVIX) 75 MG tablet TAKE 1 TABLET (75 MG TOTAL) BY MOUTH DAILY. 90 tablet 2  . metoprolol tartrate (LOPRESSOR) 25 MG tablet TAKE 1 TABLET BY MOUTH TWICE A DAY 180 tablet 3  . Multiple Vitamins-Minerals (MULTIVITAMIN WITH MINERALS) tablet Take 1 tablet by mouth daily.    Marland Kitchen NITROSTAT 0.4 MG SL tablet PLACE 1 TABLET (0.4 MG TOTAL) UNDER THE TONGUE EVERY 5 (FIVE) MINUTES AS NEEDED FOR CHEST PAIN. 25 tablet 9  . omega-3 acid ethyl esters (LOVAZA) 1 G capsule TAKE 1 CAPSULE (1 G TOTAL) BY MOUTH DAILY. 90 capsule 2  . PAZEO 0.7 % SOLN INSTILL 1 DROP INTO INTO BOTH EYES EVERY MORNING AS NEEDED FOR ITCHING  4  . ramipril (ALTACE) 5 MG capsule TAKE 1 CAPSULE (5 MG TOTAL) BY MOUTH DAILY. 90 capsule 2  . VYTORIN 10-40 MG tablet TAKE 1 TABLET BY MOUTH DAILY. 90 tablet 2   No current facility-administered medications for this visit.    Social History   Social History  . Marital Status: Married    Spouse Name: N/A  . Number of Children: N/A  . Years of Education: N/A   Occupational History  . Not on file.   Social History  Main Topics  . Smoking status: Former Smoker -- 1.00 packs/day for 10 years    Start date: 07/16/1977    Quit date: 07/17/1987  . Smokeless tobacco: Never Used  . Alcohol Use: No  . Drug Use: No  . Sexual Activity: Not on file   Other Topics Concern  . Not on file   Social History Narrative    Family History  Problem Relation Age of Onset  . Diabetes Mother   . Heart disease Father   . Diabetes Father   . Heart disease Paternal Grandfather   . Colon cancer Neg Hx   . Esophageal cancer Neg Hx   . Stomach cancer Neg Hx   . Rectal cancer Neg Hx    Social history is notable that he is married and has 2 children. There is no tobacco or alcohol use.   ROS General: Negative; No fevers, chills, or night sweats; there is purposeful weight loss HEENT: Negative; No  changes in vision or hearing, sinus congestion, difficulty swallowing Pulmonary: Negative; No cough, wheezing, shortness of breath, hemoptysis Cardiovascular: See history of present illness: No presyncope, syncope, palpitations GI: Negative; No nausea, vomiting, diarrhea, or abdominal pain GU: Negative; No dysuria, hematuria, or difficulty voiding Musculoskeletal: Left shoulder labrum tear Hematologic/Oncology: Negative; no easy bruising, bleeding Endocrine: Negative; no heat/cold intolerance; no diabetes Neuro: Negative; no changes in balance, headaches Skin: Negative; No rashes or skin lesions Psychiatric: Negative; No behavioral problems, depression Sleep: Negative; No snoring, daytime sleepiness, hypersomnolence, bruxism, restless legs, hypnogognic hallucinations, no cataplexy Other comprehensive 14 point system review is negative.   PE BP 110/60 mmHg  Pulse 58  Ht '6\' 2"'$  (1.88 m)  Wt 219 lb 4 oz (99.451 kg)  BMI 28.14 kg/m2   Repeat blood pressure by me was 118/64 supine and 120/68 standing.  Wt Readings from Last 3 Encounters:  10/20/15 219 lb 4 oz (99.451 kg)  05/18/15 221 lb 1.6 oz (100.29 kg)  04/10/15 221 lb (100.245 kg)   General: Alert, oriented, no distress.  Skin: normal turgor, no rashes HEENT: Normocephalic, atraumatic. Pupils round and reactive; sclera anicteric;no lid lag.  Nose without nasal septal hypertrophy Mouth/Parynx benign; Mallinpatti scale 2 Neck: No JVD, no carotid bruits .  Normal carotid upstroke Lungs: clear to ausculatation and percussion; no wheezing or rales Chest wall: Nontender to palpation Heart: RRR, s1 s2 normal; no S3 or S4 gallop.  No rubs thrills or heaves. Abdomen: soft, nontender; no hepatosplenomehaly, BS+; abdominal aorta nontender and not dilated by palpation. Pulses 2+ Back: No CVA tenderness Extremities: no clubbing cyanosis or edema, Homan's sign negative  Neurologic: grossly nonfocal Psychologic: normal affect and  mood.  ECG (independently read by me): Sinus bradycardia 58 bpm.  Normal intervals.  No ST segment changes.  September 2016 ECG (independently read by me): Sinus bradycardia 58 bpm.  Mild RV conduction delay.  September 2015 ECG:( Independently read by me): Sinus bradycardia 50 beats per minute.  No ST segment changes.  Normal intervals.   November 2014 ECG: Sinus bradycardia at 45 beats per minute. No significant ST-T change.  LABS: BMP Latest Ref Rng 02/09/2015 02/05/2015 01/20/2014  Glucose 65 - 99 mg/dL 165(H) 130(H) 101(H)  BUN 6 - 20 mg/dL '17 11 14  '$ Creatinine 0.61 - 1.24 mg/dL 1.04 0.82 0.86  Sodium 135 - 145 mmol/L 138 140 138  Potassium 3.5 - 5.1 mmol/L 3.8 4.7 4.6  Chloride 101 - 111 mmol/L 106 105 101  CO2 22 - 32 mmol/L 23  29 24  Calcium 8.9 - 10.3 mg/dL 9.6 9.5 9.9   Hepatic Function Latest Ref Rng 02/05/2015 01/20/2014  Total Protein 6.1 - 8.1 g/dL 7.2 7.3  Albumin 3.6 - 5.1 g/dL 4.7 4.7  AST 10 - 35 U/L 16 19  ALT 9 - 46 U/L 14 17  Alk Phosphatase 40 - 115 U/L 60 54  Total Bilirubin 0.2 - 1.2 mg/dL 1.0 1.2   CBC Latest Ref Rng 02/09/2015 02/05/2015 01/20/2014  WBC 4.0 - 10.5 K/uL 8.7 4.7 6.4  Hemoglobin 13.0 - 17.0 g/dL 14.3 14.9 15.6  Hematocrit 39.0 - 52.0 % 41.2 44.7 43.8  Platelets 150 - 400 K/uL 183 195 217   Lab Results  Component Value Date   MCV 90.4 02/09/2015   MCV 93.5 02/05/2015   MCV 88.8 01/20/2014   Lab Results  Component Value Date   TSH 1.635 02/05/2015   Lab Results  Component Value Date   HGBA1C 6.1* 02/05/2015   Lipid Panel     Component Value Date/Time   CHOL 87* 02/05/2015 0851   CHOL 99 04/02/2013 0903   TRIG 80 02/05/2015 0851   TRIG 59 04/02/2013 0903   HDL 32* 02/05/2015 0851   HDL 42 04/02/2013 0903   CHOLHDL 2.7 02/05/2015 0851   VLDL 16 02/05/2015 0851   LDLCALC 39 02/05/2015 0851   LDLCALC 45 04/02/2013 0903     RADIOLOGY: No results found.    ASSESSMENT AND PLAN: Mr. Cebert Dettmann is a 55 year old white male  who is now 12 years status post stenting to his proximal to mid LAD in October 2005.  At his last catheterization in October 2007 his LAD was widely patent and he had mild 30% narrowing.  A nuclear perfusion study in January 2013 continued to show normal perfusion.  Presently, he is without anginal symptomatology and has not noticed any change in exertional capacity.  He tolerated his left shoulder orthopedic surgery without cardiovascular compromise.  His blood pressure today is well-controlled on Prempro 5 mg and metoprolol, tartrate 25 mm grams twice a day.  He continues to be on dual antiplatelet therapy with aspirin and Plavix.  With reference to his hyperlipidemia, he is on Vytorin 10/40 in addition to omega-3 fatty acids.  He is fasting today.  A complete set of fasting lab work will be obtained.  I will contact him regarding the results.  As long as he is stable, I will see him in one year for reevaluation.  Troy Sine, MD, [facility]  10/22/2015 2:50 PM

## 2015-10-23 LAB — COMPREHENSIVE METABOLIC PANEL
ALBUMIN: 4.5 g/dL (ref 3.6–5.1)
ALK PHOS: 59 U/L (ref 40–115)
ALT: 17 U/L (ref 9–46)
AST: 19 U/L (ref 10–35)
BILIRUBIN TOTAL: 1 mg/dL (ref 0.2–1.2)
BUN: 15 mg/dL (ref 7–25)
CALCIUM: 9.4 mg/dL (ref 8.6–10.3)
CO2: 22 mmol/L (ref 20–31)
CREATININE: 0.81 mg/dL (ref 0.70–1.33)
Chloride: 105 mmol/L (ref 98–110)
GLUCOSE: 143 mg/dL — AB (ref 65–99)
Potassium: 4.5 mmol/L (ref 3.5–5.3)
SODIUM: 137 mmol/L (ref 135–146)
Total Protein: 7.1 g/dL (ref 6.1–8.1)

## 2015-10-23 LAB — LIPID PANEL
Cholesterol: 112 mg/dL — ABNORMAL LOW (ref 125–200)
HDL: 37 mg/dL — AB (ref >=40)
LDL CALC: 55 mg/dL (ref <130)
Total CHOL/HDL Ratio: 3 Ratio (ref <=5.0)
Triglycerides: 102 mg/dL (ref <150)
VLDL: 20 mg/dL (ref <30)

## 2015-10-23 LAB — HEMOGLOBIN A1C
HEMOGLOBIN A1C: 6.4 % — AB (ref <5.7)
Mean Plasma Glucose: 137 mg/dL

## 2015-10-26 ENCOUNTER — Telehealth: Payer: Self-pay | Admitting: *Deleted

## 2015-10-26 NOTE — Telephone Encounter (Signed)
Patient notified of lab results and recommendations. Patient voiced understanding. 

## 2015-10-26 NOTE — Telephone Encounter (Signed)
-----   Message from Troy Sine, MD sent at 10/26/2015  8:43 AM EDT ----- labs good x glu and HbA1c; needs improved diet;  Metabolic syndrome approaching  diabetic range; copy to primary

## 2015-11-04 ENCOUNTER — Encounter: Payer: Self-pay | Admitting: Family Medicine

## 2015-11-04 ENCOUNTER — Ambulatory Visit (INDEPENDENT_AMBULATORY_CARE_PROVIDER_SITE_OTHER): Payer: BLUE CROSS/BLUE SHIELD | Admitting: Family Medicine

## 2015-11-04 VITALS — BP 120/70 | HR 56 | Temp 97.5°F | Ht 74.0 in | Wt 220.0 lb

## 2015-11-04 DIAGNOSIS — E8881 Metabolic syndrome: Secondary | ICD-10-CM

## 2015-11-04 DIAGNOSIS — E119 Type 2 diabetes mellitus without complications: Secondary | ICD-10-CM | POA: Diagnosis not present

## 2015-11-04 MED ORDER — METFORMIN HCL 500 MG PO TABS: 500.0000 mg | ORAL_TABLET | Freq: Two times a day (BID) | ORAL | Status: DC

## 2015-11-04 NOTE — Progress Notes (Signed)
   Subjective:    Patient ID: Michael Livingston, male    DOB: February 01, 1961, 55 y.o.   MRN: [id number]  HPI Patient is here for follow-up impaired fasting glucose. Recently saw his cardiologist and had fasting glucose 143 with hemoglobin A1c 6.4%. Does not monitor blood sugars at home No polyuria or polydipsia. 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 Never been treated for diabetes other than lifestyle management. He still has some sources of sugar that he realizes he could reduce.  Past Medical History  Diagnosis Date  . Heart disease   . Hypertension   . Hyperlipidemia   . Allergy   . Diabetes mellitus without complication (HCC)     type 2, no meds needed  . Chronic kidney disease     1 kidney stone  . Myocardial infarction [facility])    Past Surgical History  Procedure Laterality Date  . Knee arthroscopy      left 2003, right 2005  . Cardiac surgery  2005    stent    reports that he quit smoking about 28 years ago. He started smoking about 38 years ago. He has never used smokeless tobacco. He reports that he does not drink alcohol or use illicit drugs. family history includes Diabetes in his father and mother; Heart disease in his father and paternal grandfather. There is no history of Colon cancer, Esophageal cancer, Stomach cancer, or Rectal cancer. Allergies  Allergen Reactions  . Aleve [Naproxen Sodium]     Swollen mouth  . Sulfa Antibiotics     hives      Review of Systems  Constitutional: Negative for fatigue.  Eyes: Negative for visual disturbance.  Respiratory: Negative for cough, chest tightness and shortness of breath.   Cardiovascular: Negative for chest pain, palpitations and leg swelling.  Endocrine: Negative for polydipsia and polyuria.  Neurological: Negative for dizziness, syncope, weakness, light-headedness and headaches.       Objective:   Physical Exam  Constitutional: He appears well-developed and well-nourished.  Cardiovascular:  Normal rate and regular rhythm.   Pulmonary/Chest: Effort normal and breath sounds normal. No respiratory distress. He has no wheezes. He has no rales.          Assessment & Plan:  Type 2 diabetes. Currently managed with lifestyle management. He's had gradual increase in A1c over the past 3 years. We've recommended start metformin 500 mg twice a day. Follow-up in 3 months and repeat A1c then. We discussed dietary factors. Stay active with aerobic exercise in combination with resistance training. Offer follow-up with diabetes educator.  Eulas Post MD [facility] at [facility]

## 2015-11-04 NOTE — Progress Notes (Signed)
Pre visit review using our clinic review tool, if applicable. No additional management support is needed unless otherwise documented below in the visit note. 

## 2015-11-11 ENCOUNTER — Other Ambulatory Visit: Payer: Self-pay

## 2015-11-11 MED ORDER — RAMIPRIL 5 MG PO CAPS: ORAL_CAPSULE | ORAL | Status: DC

## 2015-11-11 MED ORDER — OMEGA-3-ACID ETHYL ESTERS 1 G PO CAPS: ORAL_CAPSULE | ORAL | Status: DC

## 2015-11-11 MED ORDER — EZETIMIBE-SIMVASTATIN 10-40 MG PO TABS: 1.0000 | ORAL_TABLET | Freq: Every day | ORAL | Status: DC

## 2015-11-11 MED ORDER — CLOPIDOGREL BISULFATE 75 MG PO TABS: ORAL_TABLET | ORAL | Status: DC

## 2016-01-23 ENCOUNTER — Other Ambulatory Visit: Payer: Self-pay | Admitting: Cardiovascular Disease

## 2016-01-25 NOTE — Telephone Encounter (Signed)
Rx(s) sent to pharmacy electronically.  

## 2016-05-11 ENCOUNTER — Ambulatory Visit (INDEPENDENT_AMBULATORY_CARE_PROVIDER_SITE_OTHER): Payer: PRIVATE HEALTH INSURANCE | Admitting: Family Medicine

## 2016-05-11 ENCOUNTER — Encounter: Payer: Self-pay | Admitting: Family Medicine

## 2016-05-11 VITALS — BP 132/88 | HR 62 | Temp 97.4°F | Wt 219.4 lb

## 2016-05-11 DIAGNOSIS — E119 Type 2 diabetes mellitus without complications: Secondary | ICD-10-CM | POA: Diagnosis not present

## 2016-05-11 LAB — POCT GLYCOSYLATED HEMOGLOBIN (HGB A1C): Hemoglobin A1C: 5.7

## 2016-05-11 NOTE — Progress Notes (Signed)
Pre visit review using our clinic review tool, if applicable. No additional management support is needed unless otherwise documented below in the visit note. 

## 2016-05-11 NOTE — Progress Notes (Signed)
Subjective:     Patient ID: Michael Livingston, male   DOB: Jul 22, 1960, 56 y.o.   MRN: [id number]  HPI Patient seen for follow-up type 2 diabetes. Last A1c was 6.4%. He takes metformin. He states he had poor compliance with diet over the holidays. No polyuria or polydipsia. Does not monitor blood sugars regularly. Denies any blurred vision or other concerns. No neuropathy symptoms. He does have history of CAD. No recent exertional chest pains. He is overdue for follow-up with cardiology and plans to reestablish with them soon.  Past Medical History:  Diagnosis Date  . Allergy   . Chronic kidney disease    1 kidney stone  . Diabetes mellitus without complication (HCC)    type 2, no meds needed  . Heart disease   . Hyperlipidemia   . Hypertension   . Myocardial infarction    Past Surgical History:  Procedure Laterality Date  . CARDIAC SURGERY  2005   stent  . KNEE ARTHROSCOPY     left 2003, right 2005    reports that he quit smoking about 28 years ago. He started smoking about 38 years ago. He has a 10.00 pack-year smoking history. He has never used smokeless tobacco. He reports that he does not drink alcohol or use drugs. family history includes Diabetes in his father and mother; Heart disease in his father and paternal grandfather. Allergies  Allergen Reactions  . Aleve [Naproxen Sodium]     Swollen mouth  . Sulfa Antibiotics     hives     Review of Systems  Constitutional: Negative for fatigue.  Eyes: Negative for visual disturbance.  Respiratory: Negative for cough, chest tightness and shortness of breath.   Cardiovascular: Negative for chest pain, palpitations and leg swelling.  Neurological: Negative for dizziness, syncope, weakness, light-headedness and headaches.       Objective:   Physical Exam  Constitutional: He is oriented to person, place, and time. He appears well-developed and well-nourished.  HENT:  Right Ear: External ear normal.  Left Ear: External ear  normal.  Mouth/Throat: Oropharynx is clear and moist.  Eyes: Pupils are equal, round, and reactive to light.  Neck: Neck supple. No thyromegaly present.  Cardiovascular: Normal rate and regular rhythm.   Pulmonary/Chest: Effort normal and breath sounds normal. No respiratory distress. He has no wheezes. He has no rales.  Musculoskeletal: He exhibits no edema.  Neurological: He is alert and oriented to person, place, and time.       Assessment:     Type 2 diabetes. A1c today 5.7%    Plan:     -Continue low glycemic diet and metformin. -Continue regular exercise habits -Routine follow-up in 6 months  Eulas Post MD [facility] at [facility]

## 2016-05-31 ENCOUNTER — Ambulatory Visit: Payer: Self-pay | Admitting: Family Medicine

## 2016-06-27 ENCOUNTER — Telehealth: Payer: Self-pay | Admitting: Cardiovascular Disease

## 2016-06-27 DIAGNOSIS — Z79899 Other long term (current) drug therapy: Secondary | ICD-10-CM

## 2016-06-27 DIAGNOSIS — E782 Mixed hyperlipidemia: Secondary | ICD-10-CM

## 2016-06-27 DIAGNOSIS — I2510 Atherosclerotic heart disease of native coronary artery without angina pectoris: Secondary | ICD-10-CM

## 2016-06-27 NOTE — Telephone Encounter (Signed)
New Message    Please call does he need to fast for blood work? He wants everything done at his appt on Thursday

## 2016-06-27 NOTE — Telephone Encounter (Signed)
New Message    Pt c/o Shortness Of Breath: STAT if SOB developed within the last 24 hours or pt is noticeably SOB on the phone  1. Are you currently SOB (can you hear that pt is SOB on the phone)? No  2. How long have you been experiencing SOB? A few months ( 2 months)  3. Are you SOB when sitting or when up moving around? When pt puts his arms over his head he feels SOB  4. Are you currently experiencing any other symptoms? No

## 2016-06-27 NOTE — Telephone Encounter (Signed)
Returned call. Pt to see Dr. Claiborne Billings on Thursday. He's describing rare occasional slight shortness of breath which seems to be dependent on position (usually occurring when reaching above his head to get something from a shelf in home. Voices that he does not have any SOB at rest, with typical exercise/exertion, or at any other times that he can determine. He wanted to make it known in case Dr.Kelly recommended any tests to be done - wants to try to get as much testing, labwork, etc as possible done on day of visit.  Advised since this is ongoing, occasional, and not worsening in severity, reasonable to address this at appt - Dr. Claiborne Billings will advise on need for any testing at that time. Annual labwork already ordered and patient aware. He voiced understanding and thanks for call back.

## 2016-06-27 NOTE — Telephone Encounter (Signed)
Returned call to patient left message on personal voice mail to fast before lab work this [place] 06/30/16.

## 2016-06-30 ENCOUNTER — Encounter: Payer: Self-pay | Admitting: Cardiovascular Disease

## 2016-06-30 ENCOUNTER — Ambulatory Visit (INDEPENDENT_AMBULATORY_CARE_PROVIDER_SITE_OTHER): Payer: PRIVATE HEALTH INSURANCE | Admitting: Cardiovascular Disease

## 2016-06-30 VITALS — BP 116/70 | HR 48 | Ht 74.0 in | Wt 218.6 lb

## 2016-06-30 DIAGNOSIS — E8881 Metabolic syndrome: Secondary | ICD-10-CM

## 2016-06-30 DIAGNOSIS — E119 Type 2 diabetes mellitus without complications: Secondary | ICD-10-CM | POA: Diagnosis not present

## 2016-06-30 DIAGNOSIS — E782 Mixed hyperlipidemia: Secondary | ICD-10-CM | POA: Diagnosis not present

## 2016-06-30 DIAGNOSIS — I2510 Atherosclerotic heart disease of native coronary artery without angina pectoris: Secondary | ICD-10-CM | POA: Diagnosis not present

## 2016-06-30 LAB — CBC WITH DIFFERENTIAL/PLATELET
BASOS ABS: 0 {cells}/uL (ref 0–200)
Basophils Relative: 0 %
EOS ABS: 276 {cells}/uL (ref 15–500)
Eosinophils Relative: 3 %
HEMATOCRIT: 43.3 % (ref 38.5–50.0)
HEMOGLOBIN: 14.9 g/dL (ref 13.2–17.1)
LYMPHS ABS: 1564 {cells}/uL (ref 850–3900)
Lymphocytes Relative: 17 %
MCH: 31.8 pg (ref 27.0–33.0)
MCHC: 34.4 g/dL (ref 32.0–36.0)
MCV: 92.3 fL (ref 80.0–100.0)
MPV: 9.3 fL (ref 7.5–12.5)
Monocytes Absolute: 644 cells/uL (ref 200–950)
Monocytes Relative: 7 %
NEUTROS ABS: 6716 {cells}/uL (ref 1500–7800)
Neutrophils Relative %: 73 %
Platelets: 208 10*3/uL (ref 140–400)
RBC: 4.69 MIL/uL (ref 4.20–5.80)
RDW: 13.2 % (ref 11.0–15.0)
WBC: 9.2 10*3/uL (ref 3.8–10.8)

## 2016-06-30 LAB — TSH: TSH: 1.61 m[IU]/L (ref 0.40–4.50)

## 2016-06-30 NOTE — Patient Instructions (Signed)
Your physician has requested that you have an echocardiogram and office visit In 6 months.

## 2016-06-30 NOTE — Progress Notes (Signed)
Patient ID: Michael Livingston, male   DOB: 10-13-1960, 56 y.o.   MRN: [id number]     HPI: Michael Livingston is a 55 y.o. male presents to the office for a 8 month cardiology evaluation.  Michael Livingston has CAD and in October 2005 underwent stenting to his proximal to mid left anterior descending artery. His last catheterization was in October 2007 and his LAD was  was widely patent. He did have mild 30% circumflex stenosis. A nuclear perfusion study in January 2013 continued to show normal perfusion.  Michael Livingston has a history of palpitations which have been controlled with beta blocker therapy. He has a history of hyperlipidemia with an atherogenic lipid panel with low HDL levels. An NMR lipoprofile in 2014 demonstrated increased insulin resistance and an increased wall LDL particles.  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 Since that time, he has lost over 20 pounds. He denies recent chest pain. He denies significant increase in palpitations except a rare episode.   He is no longer is working for [employer], but is now owns a [employer].  He denies any exertional chest pain or dyspnea.  He denies palpitations.  He denies PND, orthopnea.  Last year he underwent orthopedic surgery to his shoulder and tolerated this well.    Since I last saw on, he has continued to be active.  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  He was started on metformin for diabetes mellitus.  In June 2017.  Hemoglobin A1c was 6.4, and most recently in January 2018.  This had improved to 5.7.  Heme notes mild shortness of breath when he raises his arms.  He denies exertional chest pain symptoms or exertional dyspnea.  He continues to be on aspirin and Plavix for dual antiplatelet therapy.  He continues to be on ram a pro-5 mg, metoprolol 25 mg twice a day for hypertension.  He is on Vytorin 10/40 for hyperlipidemia, as  well as lovaza. He presents for follow-up evaluation.   Past Medical History:  Diagnosis Date  . Allergy   . Chronic kidney disease    1 kidney stone  . Diabetes mellitus without complication (HCC)    type 2, no meds needed  . Heart disease   . Hyperlipidemia   . Hypertension   . Myocardial infarction     Past Surgical History:  Procedure Laterality Date  . CARDIAC SURGERY  2005   stent  . KNEE ARTHROSCOPY     left 2003, right 2005    Allergies  Allergen Reactions  . Aleve [Naproxen Sodium]     Swollen mouth  . Sulfa Antibiotics     hives    Current Outpatient Prescriptions  Medication Sig Dispense Refill  . aspirin 81 MG tablet Take 81 mg by mouth daily.    . clopidogrel (PLAVIX) 75 MG tablet TAKE 1 TABLET (75 MG TOTAL) BY MOUTH DAILY. 90 tablet 2  . ezetimibe-simvastatin (VYTORIN) 10-40 MG tablet Take 1 tablet by mouth daily. 90 tablet 2  . metFORMIN (GLUCOPHAGE) 500 MG tablet Take 1 tablet (500 mg total) by mouth 2 (two) times daily with a meal. 180 tablet 3  . metoprolol tartrate (LOPRESSOR) 25 MG tablet TAKE 1 TABLET BY MOUTH TWICE A DAY 180 tablet 3  . Multiple Vitamins-Minerals (MULTIVITAMIN WITH MINERALS) tablet Take 1 tablet by mouth daily.    Marland Kitchen NITROSTAT 0.4  MG SL tablet PLACE 1 TABLET (0.4 MG TOTAL) UNDER THE TONGUE EVERY 5 (FIVE) MINUTES AS NEEDED FOR CHEST PAIN. 25 tablet 9  . omega-3 acid ethyl esters (LOVAZA) 1 g capsule TAKE 1 CAPSULE (1 G TOTAL) BY MOUTH DAILY. 90 capsule 2  . PAZEO 0.7 % SOLN INSTILL 1 DROP INTO INTO BOTH EYES EVERY MORNING AS NEEDED FOR ITCHING  4  . ramipril (ALTACE) 5 MG capsule TAKE 1 CAPSULE (5 MG TOTAL) BY MOUTH DAILY. 90 capsule 2   No current facility-administered medications for this visit.     Social History   Social History  . Marital status: Married    Spouse name: N/A  . Number of children: N/A  . Years of education: N/A   Occupational History  . Not on file.   Social History Main Topics  . Smoking status:  Former Smoker    Packs/day: 1.00    Years: 10.00    Start date: 07/16/1977    Quit date: 07/17/1987  . Smokeless tobacco: Never Used  . Alcohol use No  . Drug use: No  . Sexual activity: Not on file   Other Topics Concern  . Not on file   Social History Narrative  . No narrative on file    Family History  Problem Relation Age of Onset  . Diabetes Mother   . Heart disease Father   . Diabetes Father   . Heart disease Paternal Grandfather   . Colon cancer Neg Hx   . Esophageal cancer Neg Hx   . Stomach cancer Neg Hx   . Rectal cancer Neg Hx    Social history is notable that he is married and has 2 children. There is no tobacco or alcohol use. He has an autistic son.   ROS General: Negative; No fevers, chills, or night sweats; there is purposeful weight loss HEENT: Negative; No changes in vision or hearing, sinus congestion, difficulty swallowing Pulmonary: Negative; No cough, wheezing, shortness of breath, hemoptysis Cardiovascular: See history of present illness: No presyncope, syncope, palpitations GI: Negative; No nausea, vomiting, diarrhea, or abdominal pain GU: Negative; No dysuria, hematuria, or difficulty voiding Musculoskeletal: Left shoulder labrum tear Hematologic/Oncology: Negative; no easy bruising, bleeding Endocrine: Negative; no heat/cold intolerance; no diabetes Neuro: Negative; no changes in balance, headaches Skin: Negative; No rashes or skin lesions Psychiatric: Negative; No behavioral problems, depression Sleep: Negative; No snoring, daytime sleepiness, hypersomnolence, bruxism, restless legs, hypnogognic hallucinations, no cataplexy Other comprehensive 14 point system review is negative.   PE BP 116/70 (BP Location: Right Arm)   Pulse (!) 48   Ht _0  (1.88 m)   Wt 218 lb 9.6 oz (99.2 kg)   BMI 28.07 kg/m    Repeat blood pressure by me was 120/64 supine and 1118/68 standing.  Wt Readings from Last 3 Encounters:  06/30/16 218 lb 9.6 oz (99.2  kg)  05/11/16 219 lb 6.4 oz (99.5 kg)  11/04/15 220 lb (99.8 kg)   General: Alert, oriented, no distress.  Skin: normal turgor, no rashes HEENT: Normocephalic, atraumatic. Pupils round and reactive; sclera anicteric;no lid lag.  Nose without nasal septal hypertrophy Mouth/Parynx benign; Mallinpatti scale 2 Neck: No JVD, no carotid bruits .  Normal carotid upstroke Lungs: clear to ausculatation and percussion; no wheezing or rales Chest wall: Nontender to palpation Heart: RRR, s1 s2 normal; no S3 or S4 gallop.  No rubs thrills or heaves. Abdomen: soft, nontender; no hepatosplenomehaly, BS+; abdominal aorta nontender and not dilated by palpation. Pulses 2+  Back: No CVA tenderness Extremities: no clubbing cyanosis or edema, Homan's sign negative  Neurologic: grossly nonfocal Psychologic: normal affect and mood.  ECG (independently read by me): Sinus bradycardia at 48 bpm.  No ST segment changes.  Normal intervals.  June 2017 ECG (independently read by me): Sinus bradycardia 58 bpm.  Normal intervals.  No ST segment changes.  September 2016 ECG (independently read by me): Sinus bradycardia 58 bpm.  Mild RV conduction delay.  September 2015 ECG:( Independently read by me): Sinus bradycardia 50 beats per minute.  No ST segment changes.  Normal intervals.   November 2014 ECG: Sinus bradycardia at 45 beats per minute. No significant ST-T change.  LABS: BMP Latest Ref Rng & Units 10/20/2015 02/09/2015 02/05/2015  Glucose 65 - 99 mg/dL 143(H) 165(H) 130(H)  BUN 7 - 25 mg/dL _0 Creatinine 0.70 - 1.33 mg/dL 0.81 1.04 0.82  Sodium 135 - 146 mmol/L 137 138 140  Potassium 3.5 - 5.3 mmol/L 4.5 3.8 4.7  Chloride 98 - 110 mmol/L 105 106 105  CO2 20 - 31 mmol/L _1 Calcium 8.6 - 10.3 mg/dL 9.4 9.6 9.5   Hepatic Function Latest Ref Rng & Units 10/20/2015 02/05/2015 01/20/2014  Total Protein 6.1 - 8.1 g/dL 7.1 7.2 7.3  Albumin 3.6 - 5.1 g/dL 4.5 4.7 4.7  AST 10 - 35 U/L _2 ALT 9  - 46 U/L _3 Alk Phosphatase 40 - 115 U/L 59 60 54  Total Bilirubin 0.2 - 1.2 mg/dL 1.0 1.0 1.2   CBC Latest Ref Rng & Units 10/20/2015 02/09/2015 02/05/2015  WBC 3.8 - 10.8 K/uL 4.8 8.7 4.7  Hemoglobin 13.2 - 17.1 g/dL 14.7 14.3 14.9  Hematocrit 38.5 - 50.0 % 42.1 41.2 44.7  Platelets 140 - 400 K/uL 201 183 195   Lab Results  Component Value Date   MCV 91.3 10/20/2015   MCV 90.4 02/09/2015   MCV 93.5 02/05/2015   Lab Results  Component Value Date   TSH 1.78 10/20/2015   Lab Results  Component Value Date   HGBA1C 5.7 05/11/2016   Lipid Panel     Component Value Date/Time   CHOL 112 (L) 10/20/2015 0820   CHOL 99 04/02/2013 0903   TRIG 102 10/20/2015 0820   TRIG 59 04/02/2013 0903   HDL 37 (L) 10/20/2015 0820   HDL 42 04/02/2013 0903   CHOLHDL 3.0 10/20/2015 0820   VLDL 20 10/20/2015 0820   LDLCALC 55 10/20/2015 0820   LDLCALC 45 04/02/2013 0903     RADIOLOGY: No results found.  IMPRESSION:  1. Atherosclerosis of native coronary artery of native heart without angina pectoris   2. Hyperlipidemia, mixed   3. Metabolic syndrome     ASSESSMENT AND PLAN: Michael Livingston is a 56 year old white male who is now almost 13 years status post stenting to his proximal to mid LAD in October 2005.  At his last catheterization in October 2007 his LAD was widely patent and he had mild 30% narrowing.  A nuclear perfusion study in January 2013 continued to show normal perfusion.  Presently, he is without anginal symptomatology and has not noticed any change in exertional capacity.  He tolerated his left shoulder orthopedic surgery without cardiovascular compromise.  He has experienced some mild shortness of breath if he lifts his arms above his head.  I suspect this may be due to his affect on chest excursion rather than be of cardiac etiology.  He denies any exertional symptoms.  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  His blood pressure today remains stable on ramipril 5 mg and metoprolol 25 mg twice a day.Marland Kitchen  He is bradycardic but asymptomatic.  He is not having any palpitations. He continues to be on dual antiplatelet therapy with aspirin and Plavix.  With reference to his hyperlipidemia, he is on Vytorin 10/40 in addition to omega-3 fatty acids.  LDL in June 2017 showed an LDL of 55.  I reassured him that he did not believe his shortness of breath with lifting his arms was ischemic mediated.  See him in 6 months for reevaluation.  Troy Sine, MD, [facility]  06/30/2016 10:18 AM

## 2016-07-01 LAB — COMPREHENSIVE METABOLIC PANEL
ALT: 13 U/L (ref 9–46)
AST: 16 U/L (ref 10–35)
Albumin: 4.4 g/dL (ref 3.6–5.1)
Alkaline Phosphatase: 56 U/L (ref 40–115)
BILIRUBIN TOTAL: 1.2 mg/dL (ref 0.2–1.2)
BUN: 14 mg/dL (ref 7–25)
CALCIUM: 9.5 mg/dL (ref 8.6–10.3)
CO2: 25 mmol/L (ref 20–31)
Chloride: 104 mmol/L (ref 98–110)
Creat: 0.95 mg/dL (ref 0.70–1.33)
GLUCOSE: 115 mg/dL — AB (ref 65–99)
Potassium: 4.6 mmol/L (ref 3.5–5.3)
Sodium: 139 mmol/L (ref 135–146)
Total Protein: 7.2 g/dL (ref 6.1–8.1)

## 2016-07-01 LAB — HEMOGLOBIN A1C
HEMOGLOBIN A1C: 5.7 % — AB (ref <5.7)
MEAN PLASMA GLUCOSE: 117 mg/dL

## 2016-07-01 LAB — LIPID PANEL
CHOL/HDL RATIO: 2.9 ratio (ref <5.0)
Cholesterol: 93 mg/dL (ref <200)
HDL: 32 mg/dL — ABNORMAL LOW (ref >40)
LDL Cholesterol: 44 mg/dL (ref <100)
Triglycerides: 86 mg/dL (ref <150)
VLDL: 17 mg/dL (ref <30)

## 2016-07-10 NOTE — Telephone Encounter (Signed)
Reviewed with patient  office visit

## 2016-07-12 LAB — HM DIABETES EYE EXAM

## 2016-07-19 ENCOUNTER — Encounter: Payer: Self-pay | Admitting: Family Medicine

## 2016-08-16 ENCOUNTER — Other Ambulatory Visit: Payer: Self-pay

## 2016-08-16 MED ORDER — CLOPIDOGREL BISULFATE 75 MG PO TABS: 75.0000 mg | ORAL_TABLET | Freq: Every day | ORAL | 3 refills | Status: DC

## 2016-08-16 MED ORDER — RAMIPRIL 5 MG PO CAPS: 5.0000 mg | ORAL_CAPSULE | Freq: Every day | ORAL | 3 refills | Status: DC

## 2016-08-16 MED ORDER — OMEGA-3-ACID ETHYL ESTERS 1 G PO CAPS: 1.0000 g | ORAL_CAPSULE | Freq: Every day | ORAL | 3 refills | Status: DC

## 2016-08-16 NOTE — Telephone Encounter (Signed)
Rx(s) sent to pharmacy electronically.  

## 2016-08-22 ENCOUNTER — Telehealth: Payer: Self-pay | Admitting: Cardiovascular Disease

## 2016-08-22 NOTE — Telephone Encounter (Signed)
Spoke with pt, according to the last office note, the patient was to have echo and then see dr Claiborne Billings. Echo scheduled for 11-15-16, appointment rescheduled for after echo complete.

## 2016-08-22 NOTE — Telephone Encounter (Signed)
New message   Pt is calling to find out if he is supposed to have orders for some kind of test before his appt in June to see Dr. Claiborne Billings.

## 2016-09-07 ENCOUNTER — Other Ambulatory Visit: Payer: Self-pay | Admitting: Cardiovascular Disease

## 2016-10-21 ENCOUNTER — Encounter: Payer: Self-pay | Admitting: Family Medicine

## 2016-10-21 ENCOUNTER — Ambulatory Visit (INDEPENDENT_AMBULATORY_CARE_PROVIDER_SITE_OTHER): Payer: PRIVATE HEALTH INSURANCE | Admitting: Family Medicine

## 2016-10-21 VITALS — BP 102/78 | HR 65 | Temp 97.7°F | Ht 74.0 in | Wt 214.2 lb

## 2016-10-21 DIAGNOSIS — S60031A Contusion of right middle finger without damage to nail, initial encounter: Secondary | ICD-10-CM | POA: Diagnosis not present

## 2016-10-21 DIAGNOSIS — R195 Other fecal abnormalities: Secondary | ICD-10-CM | POA: Diagnosis not present

## 2016-10-21 DIAGNOSIS — E119 Type 2 diabetes mellitus without complications: Secondary | ICD-10-CM | POA: Diagnosis not present

## 2016-10-21 MED ORDER — METFORMIN HCL ER 750 MG PO TB24: 750.0000 mg | ORAL_TABLET | Freq: Every day | ORAL | 3 refills | Status: DC

## 2016-10-21 NOTE — Progress Notes (Signed)
Subjective:     Patient ID: Michael Livingston, male   DOB: 06/19/1960, 56 y.o.   MRN: [id number]  HPI Patient seen for the following 2 items  Type 2 diabetes. History of good control. Last A1c 5.7%. He's on metformin 500 g twice daily which is controlling diabetes. However, he's had frequent loose stools somewhat inconsistently though and has questions about possible change to extended release metformin. Denies any abdominal pain. Appetite and weight stable. No bloody stools. Colonoscopy up-to-date  Other acute problem of some right hand pain. States about 3 months ago he was laying down tailgate on his truck and this basically compressed his right hand between the tailgate and a hitch on a trailer. He had some break in the skin of the right reading and middle finger in his had some persistent soreness of the PIP joints. Full range of motion.  Past Medical History:  Diagnosis Date  . Allergy   . Chronic kidney disease    1 kidney stone  . Diabetes mellitus without complication (HCC)    type 2, no meds needed  . Heart disease   . Hyperlipidemia   . Hypertension   . Myocardial infarction [facility])    Past Surgical History:  Procedure Laterality Date  . CARDIAC SURGERY  2005   stent  . KNEE ARTHROSCOPY     left 2003, right 2005    reports that he quit smoking about 29 years ago. He started smoking about 39 years ago. He has a 10.00 pack-year smoking history. He has never used smokeless tobacco. He reports that he does not drink alcohol or use drugs. family history includes Diabetes in his father and mother; Heart disease in his father and paternal grandfather. Allergies  Allergen Reactions  . Aleve [Naproxen Sodium]     Swollen mouth  . Sulfa Antibiotics     hives     Review of Systems  Constitutional: Negative for fatigue.  Eyes: Negative for visual disturbance.  Respiratory: Negative for cough, chest tightness and shortness of breath.   Cardiovascular: Negative for chest pain,  palpitations and leg swelling.  Gastrointestinal: Negative for abdominal pain, nausea and vomiting.  Neurological: Negative for dizziness, syncope, weakness, light-headedness and headaches.       Objective:   Physical Exam  Constitutional: He is oriented to person, place, and time. He appears well-developed and well-nourished.  HENT:  Right Ear: External ear normal.  Left Ear: External ear normal.  Mouth/Throat: Oropharynx is clear and moist.  Eyes: Pupils are equal, round, and reactive to light.  Neck: Neck supple. No thyromegaly present.  Cardiovascular: Normal rate and regular rhythm.   Pulmonary/Chest: Effort normal and breath sounds normal. No respiratory distress. He has no wheezes. He has no rales.  Musculoskeletal: He exhibits no edema.  Right hand-full range of motion all digits of the hand. Minimal tenderness over the PIP joints of the third and fourth digits. No warmth. No erythema.  Neurological: He is alert and oriented to person, place, and time.       Assessment:     #1 occasional loose stools probably related to metformin  #2 type 2 diabetes well controlled  #3 probable contusion right third and fourth fingers from injury as above. He has no evidence for functional problem with flexion or extension of any joint    Plan:     -Change metformin to metformin XR 750 mg once daily -Schedule follow-up in 3 months to reassess A1c -Offered x-ray right hand but at this  point he wishes to observe  Eulas Post MD [facility]

## 2016-11-04 ENCOUNTER — Ambulatory Visit: Payer: Self-pay | Admitting: Cardiovascular Disease

## 2016-11-08 ENCOUNTER — Ambulatory Visit: Payer: Self-pay | Admitting: Family Medicine

## 2016-11-15 ENCOUNTER — Other Ambulatory Visit: Payer: Self-pay

## 2016-11-15 ENCOUNTER — Ambulatory Visit (HOSPITAL_COMMUNITY): Payer: PRIVATE HEALTH INSURANCE | Attending: Internal Medicine

## 2016-11-15 DIAGNOSIS — I252 Old myocardial infarction: Secondary | ICD-10-CM | POA: Diagnosis not present

## 2016-11-15 DIAGNOSIS — I10 Essential (primary) hypertension: Secondary | ICD-10-CM | POA: Diagnosis not present

## 2016-11-15 DIAGNOSIS — I2510 Atherosclerotic heart disease of native coronary artery without angina pectoris: Secondary | ICD-10-CM | POA: Insufficient documentation

## 2016-11-15 DIAGNOSIS — E119 Type 2 diabetes mellitus without complications: Secondary | ICD-10-CM | POA: Diagnosis not present

## 2016-11-15 DIAGNOSIS — E785 Hyperlipidemia, unspecified: Secondary | ICD-10-CM | POA: Insufficient documentation

## 2016-11-16 NOTE — Progress Notes (Signed)
Cardiology Office Note    Date:  11/17/2016   ID:  Michael Livingston, DOB Nov 24, 1960, MRN [id number]  PCP:  Eulas Post, MD  Cardiologist: Dr. Claiborne Billings   Chief Complaint  Patient presents with  . Follow-up    recent echocardiogram; no symptoms    History of Present Illness:    Michael Livingston is a 56 y.o. male with past medical history of CAD (s/p DES to LAD in 2005, patent by cath in 2007 with 30% LCx stenosis noted at that time, normal NST in 05/2011), HTN, HLD, Type 2 DM and palpitations who presents to the office today for 68-month follow-up.   He was last examined by Dr. Claiborne Billings in 06/2016 and reported doing well from a cardiac perspective at that time. He denied any recent chest pain or dyspnea on exertion. Reported mild dyspnea when raising his arms above his head. He was continued on DAPT with ASA and Plavix along with Lopressor 25mg  BID, Ramipril 5mg  daily, and Vytorin 10-40mg . Lipid Panel showed total cholesterol of 93, HDL 32, and LDL of 44.  Echocardiogram was obtained on 11/15/2016 and showed a preserved EF of 60-65% with no regional WMA. Trivial MR was noted.   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 He denies any recent chest pain or dyspnea on exertion. No recent orthopnea, PND, or lower extremity edema.  He reports having episodes of palpitations occurring several years ago. His Lopressor dosing was titrated at that time and he denies any recurrent symptoms since. Does note an episode occurring a few weeks ago when he was working on a roof for over 3 hours. He felt very fatigued but symptoms improved with hydration. No anginal symptoms at that time.   Past Medical History:  Diagnosis Date  . Allergy   . CAD (coronary artery disease)    a. s/p DES to LAD in 2005 b. patent by cath in 2007 with 30% LCx stenosis noted at that time c. normal  NST in 05/2011  . Chronic kidney disease    1 kidney stone  . Diabetes mellitus without complication (HCC)    type 2, no meds needed  . Hyperlipidemia   . Hypertension   . Myocardial infarction [facility])     Past Surgical History:  Procedure Laterality Date  . CARDIAC SURGERY  2005   stent  . KNEE ARTHROSCOPY     left 2003, right 2005    Current Medications: Outpatient Medications Prior to Visit  Medication Sig Dispense Refill  . aspirin 81 MG tablet Take 81 mg by mouth daily.    . clopidogrel (PLAVIX) 75 MG tablet Take 1 tablet (75 mg total) by mouth daily. 90 tablet 3  . ezetimibe-simvastatin (VYTORIN) 10-40 MG tablet TAKE 1 TABLET BY MOUTH EVERY DAY 90 tablet 3  . metFORMIN (GLUCOPHAGE XR) 750 MG 24 hr tablet Take 1 tablet (750 mg total) by mouth daily with breakfast. 90 tablet 3  . metoprolol tartrate (LOPRESSOR) 25 MG tablet TAKE 1 TABLET BY MOUTH TWICE A DAY 180 tablet 3  . Multiple Vitamins-Minerals (MULTIVITAMIN WITH MINERALS) tablet Take 1 tablet by mouth daily.    Marland Kitchen NITROSTAT 0.4 MG SL tablet PLACE 1 TABLET (0.4 MG TOTAL) UNDER THE TONGUE EVERY 5 (FIVE) MINUTES AS NEEDED FOR CHEST PAIN. 25 tablet 9  . omega-3 acid ethyl esters (LOVAZA) 1 g capsule Take  1 capsule (1 g total) by mouth daily. 90 capsule 3  . PAZEO 0.7 % SOLN INSTILL 1 DROP INTO INTO BOTH EYES EVERY MORNING AS NEEDED FOR ITCHING  4  . ramipril (ALTACE) 5 MG capsule Take 1 capsule (5 mg total) by mouth daily. 90 capsule 3   No facility-administered medications prior to visit.      Allergies:   Aleve [naproxen sodium] and Sulfa antibiotics   Social History   Social History  . Marital status: Married    Spouse name: N/A  . Number of children: N/A  . Years of education: N/A   Social History Main Topics  . Smoking status: Former Smoker    Packs/day: 1.00    Years: 10.00    Start date: 07/16/1977    Quit date: 07/17/1987  . Smokeless tobacco: Never Used  . Alcohol use No  . Drug use: No  . Sexual  activity: Not Asked   Other Topics Concern  . None   Social History Narrative  . None     Family History:  The patient's family history includes Diabetes in his father and mother; Heart disease in his father and paternal grandfather.   Review of Systems:   Please see the history of present illness.     General:  No chills, fever, night sweats or weight changes.  Cardiovascular:  No chest pain, dyspnea on exertion, edema, orthopnea, palpitations, paroxysmal nocturnal dyspnea. Dermatological: No rash, lesions/masses Respiratory: No cough, dyspnea Urologic: No hematuria, dysuria Abdominal:   No nausea, vomiting, diarrhea, bright red blood per rectum, melena, or hematemesis Neurologic:  No visual changes, wkns, changes in mental status.  He denies any of the above symptoms.   All other systems reviewed and are otherwise negative except as noted above.   Physical Exam:    VS:  BP (!) 142/74   Pulse 64   Ht 6\' 2"  (1.88 m)   Wt 215 lb (97.5 kg)   BMI 27.60 kg/m    General: Well developed, well nourished Caucasian male appearing in no acute distress. Head: Normocephalic, atraumatic, sclera non-icteric, no xanthomas, nares are without discharge.  Neck: No carotid bruits. JVD not elevated.  Lungs: Respirations regular and unlabored, without wheezes or rales.  Heart: Regular rate and rhythm. No S3 or S4.  No murmur, no rubs, or gallops appreciated. Abdomen: Soft, non-tender, non-distended with normoactive bowel sounds. No hepatomegaly. No rebound/guarding. No obvious abdominal masses. Msk:  Strength and tone appear normal for age. No joint deformities or effusions. Extremities: No clubbing or cyanosis. No lower extremity edema.  Distal pedal pulses are 2+ bilaterally. Neuro: Alert and oriented X 3. Moves all extremities spontaneously. No focal deficits noted. Psych:  Responds to questions appropriately with a normal affect. Skin: No rashes or lesions noted  Wt Readings from Last 3  Encounters:  11/17/16 215 lb (97.5 kg)  10/21/16 214 lb 3.2 oz (97.2 kg)  06/30/16 218 lb 9.6 oz (99.2 kg)     Studies/Labs Reviewed:   EKG:  EKG is not ordered today.   Recent Labs: 06/30/2016: ALT 13; BUN 14; Creat 0.95; Hemoglobin 14.9; Platelets 208; Potassium 4.6; Sodium 139; TSH 1.61   Lipid Panel    Component Value Date/Time   CHOL 93 06/30/2016 1117   CHOL 99 04/02/2013 0903   TRIG 86 06/30/2016 1117   TRIG 59 04/02/2013 0903   HDL 32 (L) 06/30/2016 1117   HDL 42 04/02/2013 0903   CHOLHDL 2.9 06/30/2016 1117   VLDL  17 06/30/2016 1117   LDLCALC 44 06/30/2016 1117   LDLCALC 45 04/02/2013 0903    Additional studies/ records that were reviewed today include:   Echocardiogram: 11/15/2016 Study Conclusions  - Left ventricle: The cavity size was normal. Wall thickness was   increased in a pattern of mild LVH. Systolic function was normal.   The estimated ejection fraction was in the range of 60% to 65%.   Wall motion was normal; there were no regional wall motion   abnormalities. Left ventricular diastolic function parameters   were normal. - Mitral valve: Mildly thickened leaflets . There was trivial   regurgitation. - Left atrium: The atrium was normal in size. - Right atrium: The atrium was mildly dilated. - Inferior vena cava: The vessel was normal in size. The   respirophasic diameter changes were in the normal range (>= 50%),   consistent with normal central venous pressure.  Impressions:  - LVEF 60-65%, mild LVH, normal wall motion, normal diastolic   function, trivial MR, normal LA size, mild RAE, normal IVC.  NST: 05/2011   Assessment:    1. Coronary artery disease involving native coronary artery of native heart without angina pectoris   2. Essential hypertension   3. Hyperlipidemia with target LDL less than 70   4. Type 2 diabetes mellitus without complication, without long-term current use of insulin ([place])      Plan:   In order of  problems listed above:  1. CAD - s/p DES to LAD in 2005, patent by cath in 2007 with 30% LCx stenosis noted at that time. Underwent NST in 05/2011 which showed no evidence of ischemia.  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 He denies any recent chest pain or dyspnea on exertion.  - recent echo on 11/15/2016 showed a preserved EF of 60-65% with no regional WMA. Trivial MR was noted.  - overall, he seems to be doing well from a cardiac perspective. Continue risk factor modification with good control of HTN, HLD, and Type 2 DM. He quit smoking in 1989 and BMI has decreased to 27.6 with his increased physical activity.  - continue ASA, Plavix, BB, and statin therapy.   2. HTN - BP at 142/72 during today's visit. Reports SBP is usually in the 120's - 130's. - continue Lopressor 25mg  BID and Altace 5mg  daily.   3. HLD - Lipid Panel in 06/2016 showed total cholesterol of 93, HDL 32, and LDL 44. At goal with LDL < 70. - continue Vytorin 10-40mg  daily and Lovaza.   4. Type 2 DM - Hgb A1c improved to 5.7 in 06/2016. - remains on Metformin 750mg  daily. Denies any side-effects to this.    Medication Adjustments/Labs and Tests Ordered: Current medicines are reviewed at length with the patient today.  Concerns regarding medicines are outlined above.  Medication changes, Labs and Tests ordered today are listed in the Patient Instructions below. Patient Instructions  Medication Instructions:  Your physician recommends that you continue on your current medications as directed. Please refer to the Current Medication list given to you today.  If you need a refill on your cardiac medications before your next appointment, please call your pharmacy.  Follow-Up: Your physician wants you to follow-up in: [place] will receive a reminder letter in the mail two months in advance. If you don't receive a letter, please call our office MAY 2019 to schedule the July  2019 follow-up appointment.  Thank  you for choosing [facility] at [facility], Erma Heritage, PA-C  11/17/2016 9:22 AM    [address], [place], [place]  [address] Phone: [phone]; Fax: [phone]  [address] [place], [place] [address] Phone: [phone]

## 2016-11-17 ENCOUNTER — Ambulatory Visit (INDEPENDENT_AMBULATORY_CARE_PROVIDER_SITE_OTHER): Payer: PRIVATE HEALTH INSURANCE | Admitting: Student

## 2016-11-17 ENCOUNTER — Encounter: Payer: Self-pay | Admitting: Student

## 2016-11-17 VITALS — BP 142/74 | HR 64 | Ht 74.0 in | Wt 215.0 lb

## 2016-11-17 DIAGNOSIS — I2510 Atherosclerotic heart disease of native coronary artery without angina pectoris: Secondary | ICD-10-CM

## 2016-11-17 DIAGNOSIS — I10 Essential (primary) hypertension: Secondary | ICD-10-CM | POA: Diagnosis not present

## 2016-11-17 DIAGNOSIS — E785 Hyperlipidemia, unspecified: Secondary | ICD-10-CM

## 2016-11-17 DIAGNOSIS — E119 Type 2 diabetes mellitus without complications: Secondary | ICD-10-CM

## 2016-11-17 NOTE — Patient Instructions (Signed)
Medication Instructions:  Your physician recommends that you continue on your current medications as directed. Please refer to the Current Medication list given to you today.  If you need a refill on your cardiac medications before your next appointment, please call your pharmacy.  Follow-Up: Your physician wants you to follow-up in: [place] will receive a reminder letter in the mail two months in advance. If you don't receive a letter, please call our office MAY 2019 to schedule the July 2019 follow-up appointment.  Thank you for choosing [facility] at [facility]!!

## 2016-11-18 ENCOUNTER — Ambulatory Visit: Payer: PRIVATE HEALTH INSURANCE | Admitting: Cardiovascular Disease

## 2016-12-29 ENCOUNTER — Encounter: Payer: Self-pay | Admitting: Nurse Practitioner

## 2016-12-29 ENCOUNTER — Ambulatory Visit (INDEPENDENT_AMBULATORY_CARE_PROVIDER_SITE_OTHER): Payer: PRIVATE HEALTH INSURANCE

## 2016-12-29 ENCOUNTER — Ambulatory Visit (INDEPENDENT_AMBULATORY_CARE_PROVIDER_SITE_OTHER): Payer: PRIVATE HEALTH INSURANCE | Admitting: Nurse Practitioner

## 2016-12-29 VITALS — BP 132/84 | HR 68 | Temp 97.2°F | Ht 74.0 in | Wt 195.0 lb

## 2016-12-29 DIAGNOSIS — S62232A Other displaced fracture of base of first metacarpal bone, left hand, initial encounter for closed fracture: Secondary | ICD-10-CM | POA: Diagnosis not present

## 2016-12-29 DIAGNOSIS — M79645 Pain in left finger(s): Secondary | ICD-10-CM

## 2016-12-29 DIAGNOSIS — Z23 Encounter for immunization: Secondary | ICD-10-CM

## 2016-12-29 NOTE — Patient Instructions (Signed)
Thumb Fracture A thumb fracture is a break in one of the two bones of your thumb. The thumb bone that goes from the tip of your thumb to the first joint in your thumb is called the distal phalanx. The thumb bone that goes from the first joint to the joint at the base of your thumb is called the proximal phalanx. Breaks that occur at the joints of your thumb are harder to treat. A broken thumb is more serious than a break in one of your other fingers because you need your thumb for grasping. Thumb fractures are also more likely to lead to pain and stiffness years after healing (arthritis). What are the causes? Thumb fractures may be caused by:  A direct blow to your thumb.  Stress on your thumb from it being pulled out of place.  These types of injuries often happen as a result of:  Car accidents.  Bicycle accidents.  Falling with your hand outstretched.  Participating in sports such as wrestling, hockey, football, or skiing.  What increases the risk? You may be more likely to break your thumb if you have a condition that causes your bones to become thin and brittle (osteoporosis). What are the signs or symptoms? The most common symptom is severe pain at the fracture site. Other signs and symptoms may include:  Swelling.  Bruising.  Not being able to move the thumb.  An abnormal shape of the thumb (deformity).  Numbness or coldness.  A red, black, or blue thumbnail.  How is this diagnosed? Your health care provider may suspect a thumb fracture if you recently injured your thumb and have signs and symptoms of a fracture. An X-ray of your thumb may be done to confirm the diagnosis and determine how bad the break is. How is this treated? A thumb fracture should be treated as soon as possible. You may need to wear a padded splint to keep your thumb from moving and to protect your thumb until you can get a cast or have surgery. Treatment options include:  Immobilization. ? A cast  or splint is put on the injured area without changing the position of the broken bone. ? You may have to wear a type of cast called a spica cast or hitchhiker cast to hold the thumb in the proper position. ? A cast is usually left on for 4?6 weeks.  Closed reduction. ? In this procedure, the bones are put back into position without surgery.  Open reduction and internal fixation (ORIF). This is a surgical procedure. ? First, the fracture site is opened up. ? Then, the bone pieces are fixed into place with metal screws, plates, or wires.  External fixation. ? In this type of open reduction, the fracture is held in place by metal pins. ? The pins are attached to a stabilizing bar outside your skin.  You may need to wear a cast after surgery for up to 6 weeks.  You may need to return for X-rays to make sure your thumb is healing properly.  After your cast is taken off, you may need to do hand exercises (physical therapy) to get movement back in your thumb.  It may take another 3 months to regain complete use of your thumb.  Follow these instructions at home:  Take medicines only as directed by your health care provider.  Keep your hand elevated above the level of your heart when resting.  Keep your cast dry when bathing. Cover it with a  plastic bag as directed by your health care provider.  After your cast is removed, exercise your thumb at home. Your health care provider may suggest that you: ? Move your thumb in circles. ? Touch your thumb to your little finger. ? Do these exercises several times a day.  Ask your health care provider whether you can use a hand exerciser to strengthen your muscles.  If your thumb feels stiff while you are exercising it, try doing the exercises while soaking your hand in warm water.  Keep all follow-up visits as directed by your health care provider. This is important. Contact a health care provider if:  You have more than a small spot of  bleeding from under your cast or splint.  Your pain medicine is not helping.  You have a fever.  You have numbness or tingling in the injured area.  Your cast becomes loose or damaged.  You notice a bad odor or discharge coming from under your cast. Get help right away if:  You have pain that is very bad or getting worse.  You lose feeling in your thumb.  Your thumb turns pale or blue.  Your thumb feels cold.  You have drainage, redness, or swelling at the injury site. This information is not intended to replace advice given to you by your health care provider. Make sure you discuss any questions you have with your health care provider. Document Released: 01/22/2003 Document Revised: 12/27/2015 Document Reviewed: 06/28/2013 Elsevier Interactive Patient Education  [employer].

## 2016-12-29 NOTE — Progress Notes (Addendum)
   Subjective:    Patient ID: Michael Livingston, male    DOB: 1960/07/05, 56 y.o.   MRN: [id number]  HPI Patient comes in today stating that he shot a nail through his hand with a nail gun earlier today. Hand is very sore.  Last TD shot was    Review of Systems  Constitutional: Negative.   Respiratory: Negative.   Cardiovascular: Negative.   Neurological: Negative.   Psychiatric/Behavioral: Negative.   All other systems reviewed and are negative.      Objective:   Physical Exam  Constitutional: He is oriented to person, place, and time. He appears well-developed and well-nourished. No distress.  Cardiovascular: Normal rate and regular rhythm.   Pulmonary/Chest: Effort normal and breath sounds normal.  Neurological: He is alert and oriented to person, place, and time.  Skin: Skin is warm and dry.  Puncture wound at base of left thumb  Psychiatric: He has a normal mood and affect. His behavior is normal. Judgment and thought content normal.    BP 132/84   Pulse 68   Temp (!) 97.2 F (36.2 C) (Oral)   Ht 6\' 2"  (1.88 m)   Wt 195 lb (88.5 kg)   BMI 25.04 kg/m   Left thumb x ray- displaced fracture of metacarpal of thumb    Assessment & Plan:   1. Pain of left thumb   2. Closed displaced fracture of base of first metacarpal bone of left hand, unspecified fracture morphology, initial encounter     tetanus shot given today Ice bid Thumb spica splint Referral to ortho to be seen tomorrow Motrin or tylenol OTC for pain  Mary-Margaret Hassell Done, FNP

## 2017-01-17 ENCOUNTER — Telehealth: Payer: Self-pay | Admitting: Cardiovascular Disease

## 2017-01-17 MED ORDER — CLOPIDOGREL BISULFATE 75 MG PO TABS: 75.0000 mg | ORAL_TABLET | Freq: Every day | ORAL | 3 refills | Status: DC

## 2017-01-17 MED ORDER — EZETIMIBE-SIMVASTATIN 10-40 MG PO TABS: 1.0000 | ORAL_TABLET | Freq: Every day | ORAL | 3 refills | Status: DC

## 2017-01-17 MED ORDER — LISINOPRIL 20 MG PO TABS: 20.0000 mg | ORAL_TABLET | Freq: Every day | ORAL | 3 refills | Status: DC

## 2017-01-17 NOTE — Telephone Encounter (Signed)
Have him contact [facility].  We can switch his ramipril 5 mg to lisinopril 20 mg once daily.  Then he can get the lisinopril and clopidogrel thru them for $37/6 month supply.  He can ask them about vytorin pricing, as it is not on their flyer.  We can adjust that if needed, if they are too pricey.   Ph. [phone] (they are in [place] and offer free shipping on 6 month supplies)

## 2017-01-17 NOTE — Telephone Encounter (Signed)
Patient aware.   Med list updated, rx sent to [facility].  Patient will contact pharmacy.

## 2017-01-17 NOTE — Telephone Encounter (Signed)
Returned call to patient-patient states he is in between insurances as he is retired and cannot get insurance through [employer] until he is 75.    States he was wondering if there was any assistance or any way to get his medications cheaper, specifically Plavix, Vytorin, and Ramipril (patient states he is getting all generic).   Request to seek advice from pharmacist as he doesn't have insurance at the current.    Routed to pharmacist to review.

## 2017-01-17 NOTE — Telephone Encounter (Signed)
New Message ° ° pt verbalized that he is returning call for rn  °

## 2017-01-19 ENCOUNTER — Telehealth: Payer: Self-pay

## 2017-01-19 ENCOUNTER — Emergency Department (HOSPITAL_BASED_OUTPATIENT_CLINIC_OR_DEPARTMENT_OTHER): Payer: PRIVATE HEALTH INSURANCE

## 2017-01-19 ENCOUNTER — Telehealth: Payer: Self-pay | Admitting: Cardiovascular Disease

## 2017-01-19 ENCOUNTER — Encounter (HOSPITAL_BASED_OUTPATIENT_CLINIC_OR_DEPARTMENT_OTHER): Payer: Self-pay

## 2017-01-19 ENCOUNTER — Emergency Department (HOSPITAL_BASED_OUTPATIENT_CLINIC_OR_DEPARTMENT_OTHER)
Admission: EM | Admit: 2017-01-19 | Discharge: 2017-01-19 | Disposition: A | Discharge status: Home / Self Care | Payer: PRIVATE HEALTH INSURANCE | Attending: Emergency Medicine | Admitting: Emergency Medicine

## 2017-01-19 DIAGNOSIS — Z7902 Long term (current) use of antithrombotics/antiplatelets: Secondary | ICD-10-CM | POA: Insufficient documentation

## 2017-01-19 DIAGNOSIS — Z7982 Long term (current) use of aspirin: Secondary | ICD-10-CM | POA: Diagnosis not present

## 2017-01-19 DIAGNOSIS — Z87891 Personal history of nicotine dependence: Secondary | ICD-10-CM | POA: Diagnosis not present

## 2017-01-19 DIAGNOSIS — I480 Paroxysmal atrial fibrillation: Secondary | ICD-10-CM | POA: Insufficient documentation

## 2017-01-19 DIAGNOSIS — I2510 Atherosclerotic heart disease of native coronary artery without angina pectoris: Secondary | ICD-10-CM | POA: Insufficient documentation

## 2017-01-19 DIAGNOSIS — I129 Hypertensive chronic kidney disease with stage 1 through stage 4 chronic kidney disease, or unspecified chronic kidney disease: Secondary | ICD-10-CM | POA: Insufficient documentation

## 2017-01-19 DIAGNOSIS — Z7984 Long term (current) use of oral hypoglycemic drugs: Secondary | ICD-10-CM | POA: Insufficient documentation

## 2017-01-19 DIAGNOSIS — E119 Type 2 diabetes mellitus without complications: Secondary | ICD-10-CM | POA: Diagnosis not present

## 2017-01-19 DIAGNOSIS — R002 Palpitations: Secondary | ICD-10-CM | POA: Diagnosis present

## 2017-01-19 DIAGNOSIS — I252 Old myocardial infarction: Secondary | ICD-10-CM | POA: Insufficient documentation

## 2017-01-19 DIAGNOSIS — Z955 Presence of coronary angioplasty implant and graft: Secondary | ICD-10-CM | POA: Diagnosis not present

## 2017-01-19 DIAGNOSIS — I4891 Unspecified atrial fibrillation: Secondary | ICD-10-CM

## 2017-01-19 DIAGNOSIS — N189 Chronic kidney disease, unspecified: Secondary | ICD-10-CM | POA: Diagnosis not present

## 2017-01-19 LAB — BASIC METABOLIC PANEL
Anion gap: 6 (ref 5–15)
BUN: 15 mg/dL (ref 6–20)
CALCIUM: 9.1 mg/dL (ref 8.9–10.3)
CHLORIDE: 107 mmol/L (ref 101–111)
CO2: 25 mmol/L (ref 22–32)
CREATININE: 0.96 mg/dL (ref 0.61–1.24)
Glucose, Bld: 134 mg/dL — ABNORMAL HIGH (ref 65–99)
Potassium: 4.1 mmol/L (ref 3.5–5.1)
SODIUM: 138 mmol/L (ref 135–145)

## 2017-01-19 LAB — CBC
HCT: 42.3 % (ref 39.0–52.0)
Hemoglobin: 15 g/dL (ref 13.0–17.0)
MCH: 32.2 pg (ref 26.0–34.0)
MCHC: 35.5 g/dL (ref 30.0–36.0)
MCV: 90.8 fL (ref 78.0–100.0)
Platelets: 213 10*3/uL (ref 150–400)
RBC: 4.66 MIL/uL (ref 4.22–5.81)
RDW: 12.9 % (ref 11.5–15.5)
WBC: 7.2 10*3/uL (ref 4.0–10.5)

## 2017-01-19 LAB — TROPONIN I

## 2017-01-19 MED ORDER — ETOMIDATE 2 MG/ML IV SOLN
10.0000 mg | Freq: Once | INTRAVENOUS | Status: AC
  Administered 2017-01-19: 10 mg via INTRAVENOUS
  Filled 2017-01-19: qty 10

## 2017-01-19 MED ORDER — METOPROLOL TARTRATE 50 MG PO TABS: 50.0000 mg | ORAL_TABLET | Freq: Two times a day (BID) | ORAL | 0 refills | Status: DC

## 2017-01-19 MED ORDER — RIVAROXABAN 20 MG PO TABS: 20.0000 mg | ORAL_TABLET | Freq: Every day | ORAL | 0 refills | Status: DC

## 2017-01-19 MED FILL — METOPROLOL TARTRATE 50 MG T: 50 | 30 days supply | Qty: 60 | Fill #0

## 2017-01-19 MED FILL — XARELTO 20 MG TABLET: 20 | 30 days supply | Qty: 30 | Fill #0

## 2017-01-19 NOTE — Telephone Encounter (Signed)
Please call,pt says he has a medical question.

## 2017-01-19 NOTE — ED Triage Notes (Signed)
C/o palpitations since 2am-feeling "fullness in chest"-NAD-steady

## 2017-01-19 NOTE — ED Provider Notes (Signed)
[facility] DEPT [facility] Provider Note   CSN: [id number] Arrival date & time: 01/19/17  1117     History   Chief Complaint Chief Complaint  Patient presents with  . Palpitations    HPI Michael Livingston is a 56 y.o. male.  56 year old male with past medical history including CAD status post stenting, type 2 diabetes mellitus, hypertension, hyperlipidemia who presents with a heart palpitations. At 2 AM, the patient awoke with heart palpitations associated with a feeling of fullness in his chest. He denies any severe chest pain, just this abnormal feeling. No major shortness of breath and no nausea or vomiting. His symptoms have been constant since they began. He has noted that his heart rate has been jumping around, sometimes up to 140. He has never had this before. He follows with Dr. Claiborne Billings in the cardiology clinic and is compliant with his medications. Last oral intake was at 9:30 AM. He denies any alcohol use or recent illness. No problems with bleeding.   The history is provided by the patient.    Past Medical History:  Diagnosis Date  . Allergy   . CAD (coronary artery disease)    a. s/p DES to LAD in 2005 b. patent by cath in 2007 with 30% LCx stenosis noted at that time c. normal NST in 05/2011  . Chronic kidney disease    1 kidney stone  . Diabetes mellitus without complication (HCC)    type 2, no meds needed  . Hyperlipidemia   . Hypertension   . Myocardial infarction [facility])     Patient Active Problem List   Diagnosis Date Noted  . Preoperative clearance 01/24/2015  . Metabolic syndrome 78/29/5621  . Hyperlipidemia with target LDL less than 70 04/02/2013  . Type 2 diabetes, diet controlled ([place]) 07/16/2012  . Hyperlipidemia, mixed 07/16/2012  . Coronary atherosclerosis 02/06/2009  . ABDOMINAL PAIN, UPPER 02/06/2009    Past Surgical History:  Procedure Laterality Date  . CARDIAC SURGERY  2005   stent  . CORONARY STENT PLACEMENT    . KNEE ARTHROSCOPY     left  2003, right 2005  . SHOULDER SURGERY         Home Medications    Prior to Admission medications   Medication Sig Start Date End Date Taking? Authorizing Provider  aspirin 81 MG tablet Take 81 mg by mouth daily.    [provider]  clopidogrel (PLAVIX) 75 MG tablet Take 1 tablet (75 mg total) by mouth daily. 01/17/17   Troy Sine, MD  ezetimibe-simvastatin (VYTORIN) 10-40 MG tablet Take 1 tablet by mouth daily. 01/17/17   Troy Sine, MD  lisinopril (PRINIVIL,ZESTRIL) 20 MG tablet Take 1 tablet (20 mg total) by mouth daily. 01/17/17 04/17/17  Troy Sine, MD  metFORMIN (GLUCOPHAGE XR) 750 MG 24 hr tablet Take 1 tablet (750 mg total) by mouth daily with breakfast. 10/21/16   Burchette, Alinda Sierras, MD  metoprolol tartrate (LOPRESSOR) 50 MG tablet Take 1 tablet (50 mg total) by mouth 2 (two) times daily. 01/19/17 02/18/17  Rudi Bunyard, Wenda Overland, MD  Multiple Vitamins-Minerals (MULTIVITAMIN WITH MINERALS) tablet Take 1 tablet by mouth daily.    [provider]  NITROSTAT 0.4 MG SL tablet PLACE 1 TABLET (0.4 MG TOTAL) UNDER THE TONGUE EVERY 5 (FIVE) MINUTES AS NEEDED FOR CHEST PAIN. 07/21/15   Troy Sine, MD  omega-3 acid ethyl esters (LOVAZA) 1 g capsule Take 1 capsule (1 g total) by mouth daily. 08/16/16   Claiborne Billings,  Joyice Faster, MD  PAZEO 0.7 % SOLN INSTILL 1 DROP INTO INTO BOTH EYES EVERY MORNING AS NEEDED FOR ITCHING 10/03/15   [provider]  rivaroxaban (XARELTO) 20 MG TABS tablet Take 1 tablet (20 mg total) by mouth daily with supper. 01/19/17   Chatham Howington, Wenda Overland, MD    Family History Family History  Problem Relation Age of Onset  . Diabetes Mother   . Heart disease Father   . Diabetes Father   . Heart disease Paternal Grandfather   . Colon cancer Neg Hx   . Esophageal cancer Neg Hx   . Stomach cancer Neg Hx   . Rectal cancer Neg Hx     Social History Social History  Substance Use Topics  . Smoking status: Former Smoker    Packs/day: 1.00     Years: 10.00    Start date: 07/16/1977    Quit date: 07/17/1987  . Smokeless tobacco: Never Used  . Alcohol use No     Allergies   Aleve [naproxen sodium] and Sulfa antibiotics   Review of Systems Review of Systems All other systems reviewed and are negative except that which was mentioned in HPI   Physical Exam Updated Vital Signs BP 126/90 (BP Location: Right Arm)   Pulse 71   Temp 97.7 F (36.5 C) (Oral)   Resp 15   Ht 6\' 2"  (1.88 m)   Wt 95.3 kg (210 lb)   SpO2 100%   BMI 26.96 kg/m   Physical Exam  Constitutional: He is oriented to person, place, and time. He appears well-developed and well-nourished. No distress.  HENT:  Head: Normocephalic and atraumatic.  Moist mucous membranes  Eyes: Pupils are equal, round, and reactive to light. Conjunctivae are normal.  Neck: Neck supple.  Cardiovascular: Normal heart sounds.  An irregularly irregular rhythm present. Tachycardia present.   No murmur heard. Pulmonary/Chest: Effort normal and breath sounds normal.  Abdominal: Soft. Bowel sounds are normal. He exhibits no distension. There is no tenderness.  Musculoskeletal: He exhibits no edema.  Neurological: He is alert and oriented to person, place, and time.  Fluent speech  Skin: Skin is warm and dry.  Psychiatric: He has a normal mood and affect. Judgment normal.  Nursing note and vitals reviewed.    ED Treatments / Results  Labs (all labs ordered are listed, but only abnormal results are displayed) Labs Reviewed  BASIC METABOLIC PANEL - Abnormal; Notable for the following:       Result Value   Glucose, Bld 134 (*)    All other components within normal limits  CBC  TROPONIN I    EKG  EKG Interpretation  Date/Time:  Thursday January 19 2017 11:33:20 EDT Ventricular Rate:  140 PR Interval:    QRS Duration: 84 QT Interval:  268 QTC Calculation: 409 R Axis:   42 Text Interpretation:  Undetermined rhythm Otherwise normal ECG A fib new since previous  tracing Confirmed by Theotis Burrow [id number]) on 01/19/2017 12:18:30 PM       Radiology Dg Chest 2 View  Result Date: 01/19/2017 CLINICAL DATA:  Chest pain and palpitations. EXAM: CHEST  2 VIEW COMPARISON:  February 13, 2006. FINDINGS: The cardiomediastinal silhouette is normal in size. Normal pulmonary vascularity. Stable prominence of the left hilum, unchanged since 2007. No focal consolidation, pleural effusion, or pneumothorax. No acute osseous abnormality. IMPRESSION: No active cardiopulmonary disease. Electronically Signed   By: Titus Dubin M.D.   On: 01/19/2017 12:08    Procedures .Critical Care  Performed by: Sharlett Iles Authorized by: Sharlett Iles   Critical care provider statement:    Critical care time (minutes):  35   Critical care time was exclusive of:  Separately billable procedures and treating other patients   Critical care was necessary to treat or prevent imminent or life-threatening deterioration of the following conditions:  Cardiac failure   Critical care was time spent personally by me on the following activities:  Development of treatment plan with patient or surrogate, discussions with consultants, evaluation of patient's response to treatment, examination of patient, obtaining history from patient or surrogate, ordering and review of laboratory studies, ordering and performing treatments and interventions, ordering and review of radiographic studies, re-evaluation of patient's condition and review of old charts .Cardioversion Date/Time: 01/19/2017 2:21 PM Performed by: Sharlett Iles Authorized by: Sharlett Iles   Consent:    Consent obtained:  Written   Consent given by:  Patient   Risks discussed:  Induced arrhythmia, pain and death   Alternatives discussed:  Rate-control medication, delayed treatment and observation Pre-procedure details:    Cardioversion basis:  Elective   Rhythm:  Atrial fibrillation   Electrode  placement:  Anterior-posterior Attempt one:    Cardioversion mode:  Synchronous   Waveform:  Biphasic   Shock (Joules):  150   Shock outcome:  Conversion to normal sinus rhythm Post-procedure details:    Patient status:  Alert   Patient tolerance of procedure:  Tolerated well, no immediate complications .Sedation Date/Time: 01/19/2017 2:22 PM Performed by: Sharlett Iles Authorized by: Sharlett Iles   Consent:    Consent obtained:  Written   Consent given by:  Patient   Risks discussed:  Allergic reaction, inadequate sedation, respiratory compromise necessitating ventilatory assistance and intubation and prolonged sedation necessitating reversal   Alternatives discussed:  Analgesia without sedation Indications:    Procedure performed:  Cardioversion   Procedure necessitating sedation performed by:  Physician performing sedation   Intended level of sedation:  Moderate (conscious sedation) Pre-sedation assessment:    Time since last food or drink:  4.5   ASA classification: class 2 - patient with mild systemic disease     Neck mobility: normal     Mouth opening:  3 or more finger widths   Thyromental distance:  4 finger widths   Mallampati score:  I - soft palate, uvula, fauces, pillars visible   Pre-sedation assessments completed and reviewed: airway patency, cardiovascular function, mental status and respiratory function   Immediate pre-procedure details:    Reassessment: Patient reassessed immediately prior to procedure     Reviewed: vital signs     Verified: bag valve mask available, emergency equipment available, intubation equipment available, IV patency confirmed and oxygen available   Procedure details (see MAR for exact dosages):    Preoxygenation:  Nasal cannula   Sedation:  Etomidate   Intra-procedure monitoring:  Blood pressure monitoring   Total Provider sedation time (minutes):  10 Post-procedure details:    Post-sedation assessment completed:   01/19/2017 2:25 PM   Attendance: Constant attendance by certified staff until patient recovered     Recovery: Patient returned to pre-procedure baseline     Patient is stable for discharge or admission: yes     Patient tolerance:  Tolerated well, no immediate complications   (including critical care time)  Medications Ordered in ED Medications  etomidate (AMIDATE) injection 10 mg (10 mg Intravenous Given 01/19/17 1352)    CHA2DS2/VAS Stroke Risk Points  Not Applicable >= 2 Points: High Risk  1 - 1.99 Points: Medium Risk  0 Points: Low Risk     SCORE: 2 to 3   This score determines the patient's risk of having a stroke if the  patient has atrial fibrillation.       Initial Impression / Assessment and Plan / ED Course  I have reviewed the triage vital signs and the nursing notes.  Pertinent labs & imaging results that were available during my care of the patient were reviewed by me and considered in my medical decision making (see chart for details).    PT w/ h/o CAD p/w Palpitations since 2 AM. He was in atrial fibrillation with RVR on arrival, blood pressure reassuring and patient comfortable and well-appearing on exam. I reviewed his chart and do not see any history of atrial fibrillation. His troponin is negative. I discussed his case with cardiology, Dr. Radford Pax, and we agreed that the patient is eligible for cardioversion given that his symptoms have been less than 12 hours in onset with clear time of onset. CHA2DS2/VAS score is 2-3, therefore she recommended anticoagulation.  Obtained consent and performed cardioversion under procedural sedation. See procedure note for details. Patient converted to sinus rhythm. Discussed risks and benefits of anticoagulation and importance of compliance. Instructed to contact Dr. Evette Georges clinic for follow-up next week. Patient observed after sedation during which time he was able to tolerate PO and stated in sinus rhythm. Xarelto education to  be provided by outpatient pharmacy here. Return precautions reviewed and patient discharged in satisfactory condition.  Final Clinical Impressions(s) / ED Diagnoses   Final diagnoses:  Atrial fibrillation with RVR (HCC)    New Prescriptions New Prescriptions   METOPROLOL TARTRATE (LOPRESSOR) 50 MG TABLET    Take 1 tablet (50 mg total) by mouth 2 (two) times daily.   RIVAROXABAN (XARELTO) 20 MG TABS TABLET    Take 1 tablet (20 mg total) by mouth daily with supper.     Lillianne Eick, Wenda Overland, MD 01/19/17 1538

## 2017-01-19 NOTE — ED Notes (Signed)
Dr. Rex Kras in Pt. Room at bedside speaking with Pt. About doing cardioversion.

## 2017-01-19 NOTE — ED Notes (Signed)
Patient transported to X-ray 

## 2017-01-19 NOTE — Telephone Encounter (Signed)
Returned call to patient he stated he woke up last night with palpitations,fullness in chest radiating into shoulders and up into neck.Stated pulse has been 63 to 69.Stated he continues to have chest fullness.Rates fullness # 5.Advised to go to [facility] ED.Patient stated he has a special needs son and really does not want to go to ED.Appointment was scheduled with Almyra Deforest PA 01/20/17 at 9:30 am.Advised he needs to go to ED now.

## 2017-01-19 NOTE — ED Notes (Signed)
Pt  Is eating crackers and drinking sprite at this time with HOB elevated.  Pt. Talking on phone with son at bedside.  NO c/o pain and no noted ectopy on monitor. SR noted on heart monitor.

## 2017-01-19 NOTE — Sedation Documentation (Signed)
Pt. Reports he feels tired and relaxed.

## 2017-01-19 NOTE — Telephone Encounter (Signed)
Received a call from patient.He stated he is going to [place] on [address].Stated it is closer to his home.

## 2017-01-19 NOTE — Telephone Encounter (Signed)
Received a call from patient he stated he is presently at [place] in [facility].Stated he is in AFIb rate 90 to 150.Spoke to [place] she will be calling cardiologist on call and sending patient to [facility] hospital to be cardioverted.

## 2017-01-19 NOTE — ED Notes (Signed)
Pt. Rate at present time is 146  Pt. Is cool and dry.

## 2017-01-19 NOTE — ED Notes (Signed)
ED Provider at bedside. 

## 2017-01-19 NOTE — Telephone Encounter (Signed)
Received call from patient.He wanted Dr.Kelly to know he will be having cardioversion done this afternoon with Dr.Rachel Little at [facility].Dr.Kelly out of office this week.I will send message to him.

## 2017-01-19 NOTE — Telephone Encounter (Signed)
Pt calling you back

## 2017-01-20 ENCOUNTER — Telehealth: Payer: Self-pay

## 2017-01-20 ENCOUNTER — Ambulatory Visit: Payer: Self-pay | Admitting: Physician Assistant

## 2017-01-20 NOTE — Telephone Encounter (Signed)
Spoke to patient.He stated had cardioversion at [place] in [facility] yesterday.Stated he feels much better today.Stated he was started on Xarelto 20 mg daily.He wanted to know if he needed to continue aspirin and plavix.Spoke to [place] PA she will speak to [facility] next week when he is back in office,for now stop aspirin continue plavix.Follow up appointment scheduled with Bernerd Pho PA 01/30/17 at 9:00 am

## 2017-01-24 NOTE — Telephone Encounter (Signed)
acknowledged

## 2017-01-24 NOTE — Telephone Encounter (Signed)
The patient is status post remote stenting in 2005.  With his recent atrial fibrillation at this point, favor continuing low-dose aspirin 81 mg and Xarelto and discontinue his Plavix.

## 2017-01-25 NOTE — Addendum Note (Signed)
Addended by: Kathyrn Lass on: 01/25/2017 09:39 AM   Modules accepted: Orders

## 2017-01-25 NOTE — Telephone Encounter (Signed)
Spoke to patient Dr.Kelly's recommendations given.Advised to keep appointment with Bernerd Pho PA 01/30/17 at 9:00 am.

## 2017-01-29 NOTE — Progress Notes (Signed)
Cardiology Office Note    Date:  01/30/2017   ID:  Michael Livingston, DOB Mar 17, 1961, MRN [id number]  PCP:  Michael Post, MD  Cardiologist: Dr. Claiborne Billings   Chief Complaint  Patient presents with  . Follow-up    Recent ED visit    History of Present Illness:    Michael Livingston is a 56 y.o. male with past medical history of CAD (s/p DES to LAD in 2005, patent by cath in 2007 with 30% LCx stenosis noted at that time, normal NST in 05/2011), HTN, HLD, Type 2 DM and palpitations who presents to the office today  for follow-up of a recent Emergency Department Visit.  He called the office on 01/19/2017 reporting palpitations and a fullness in his chest which awoke him from sleep during the night. He was encouraged to proceed to the nearest ED and went to [facility] for evaluation and found to be in atrial fibrillation with RVR, HR in the 140's. CBC and BMET showed no acute abnormalities and TSH was within normal limits in 06/2016. A DCCV was pursued at 150J with return to NSR. Lopressor was increased from 25mg  BID to 50mg  BID and he was started on Xarelto 20mg  daily due to his CHA2DS2-VASc Score of 3 (CAD, HTN, DM). He has contacted the office in the interim and with him being started on Xarelto, Plavix has been discontinued but he remains on ASA 81mg  daily.   In talking with the patient today, he reports doing well since his recent cardioversion. He denies any recurrent palpitations or chest discomfort. He has noticed worsening fatigue and feels like this might be secondary to his Lopressor as heart rate has been in the 50's at times. No recent orthopnea, PND, or lower extremity edema.  He does not consume alcohol and only consumes one cup of coffee per day. 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 He asks about stopping Lovaza as his insurance recently changed and the medication has a higher  co-pay.     Past Medical History:  Diagnosis Date  . Allergy   . Atrial fibrillation ([place])    a. diagnosed in 01/2017 --> s/p DCCV. Started on Xarelto for anticoagulation.   Marland Kitchen CAD (coronary artery disease)    a. s/p DES to LAD in 2005 b. patent by cath in 2007 with 30% LCx stenosis noted at that time c. normal NST in 05/2011  . Chronic kidney disease    1 kidney stone  . Diabetes mellitus without complication (HCC)    type 2, no meds needed  . Hyperlipidemia   . Hypertension   . Myocardial infarction [facility])     Past Surgical History:  Procedure Laterality Date  . CARDIAC SURGERY  2005   stent  . CORONARY STENT PLACEMENT    . KNEE ARTHROSCOPY     left 2003, right 2005  . SHOULDER SURGERY      Current Medications: Outpatient Medications Prior to Visit  Medication Sig Dispense Refill  . aspirin EC 81 MG tablet Take 1 tablet (81 mg total) by mouth daily. 30 tablet 6  . ezetimibe-simvastatin (VYTORIN) 10-40 MG tablet Take 1 tablet by mouth daily. 90 tablet 3  . lisinopril (PRINIVIL,ZESTRIL) 20 MG tablet Take 1 tablet (20 mg total) by mouth daily. 90 tablet 3  . metFORMIN (GLUCOPHAGE XR) 750 MG 24 hr tablet Take 1 tablet (750 mg  total) by mouth daily with breakfast. 90 tablet 3  . Multiple Vitamins-Minerals (MULTIVITAMIN WITH MINERALS) tablet Take 1 tablet by mouth daily.    Marland Kitchen PAZEO 0.7 % SOLN INSTILL 1 DROP INTO INTO BOTH EYES EVERY MORNING AS NEEDED FOR ITCHING  4  . metoprolol tartrate (LOPRESSOR) 50 MG tablet Take 1 tablet (50 mg total) by mouth 2 (two) times daily. 60 tablet 0  . NITROSTAT 0.4 MG SL tablet PLACE 1 TABLET (0.4 MG TOTAL) UNDER THE TONGUE EVERY 5 (FIVE) MINUTES AS NEEDED FOR CHEST PAIN. 25 tablet 9  . omega-3 acid ethyl esters (LOVAZA) 1 g capsule Take 1 capsule (1 g total) by mouth daily. 90 capsule 3  . rivaroxaban (XARELTO) 20 MG TABS tablet Take 1 tablet (20 mg total) by mouth daily with supper. 30 tablet 0   No facility-administered medications prior to  visit.      Allergies:   Aleve [naproxen sodium] and Sulfa antibiotics   Social History   Social History  . Marital status: Married    Spouse name: N/A  . Number of children: N/A  . Years of education: N/A   Social History Main Topics  . Smoking status: Former Smoker    Packs/day: 1.00    Years: 10.00    Start date: 07/16/1977    Quit date: 07/17/1987  . Smokeless tobacco: Never Used  . Alcohol use No  . Drug use: No  . Sexual activity: Not Asked   Other Topics Concern  . None   Social History Narrative  . None     Family History:  The patient's family history includes Diabetes in his father and mother; Heart disease in his father and paternal grandfather.   Review of Systems:   Please see the history of present illness.     General:  No chills, fever, night sweats or weight changes. Positive for fatigue.  Cardiovascular:  No chest pain, dyspnea on exertion, edema, orthopnea, paroxysmal nocturnal dyspnea. Positive for palpitations (now resolved).  Dermatological: No rash, lesions/masses Respiratory: No cough, dyspnea Urologic: No hematuria, dysuria Abdominal:   No nausea, vomiting, diarrhea, bright red blood per rectum, melena, or hematemesis Neurologic:  No visual changes, wkns, changes in mental status. All other systems reviewed and are otherwise negative except as noted above.   Physical Exam:    VS:  BP 122/74   Pulse (!) 54   Ht 6\' 2"  (1.88 m)   Wt 215 lb (97.5 kg)   BMI 27.60 kg/m    General: Well developed, well nourished Caucasian male appearing in no acute distress. Head: Normocephalic, atraumatic, sclera non-icteric, no xanthomas, nares are without discharge.  Neck: No carotid bruits. JVD not elevated.  Lungs: Respirations regular and unlabored, without wheezes or rales.  Heart: Regular rate and rhythm. No S3 or S4.  No murmur, no rubs, or gallops appreciated. Abdomen: Soft, non-tender, non-distended with normoactive bowel sounds. No hepatomegaly.  No rebound/guarding. No obvious abdominal masses. Msk:  Strength and tone appear normal for age. No joint deformities or effusions. Extremities: No clubbing or cyanosis. No lower extremity edema.  Distal pedal pulses are 2+ bilaterally. Neuro: Alert and oriented X 3. Moves all extremities spontaneously. No focal deficits noted. Psych:  Responds to questions appropriately with a normal affect. Skin: No rashes or lesions noted  Wt Readings from Last 3 Encounters:  01/30/17 215 lb (97.5 kg)  01/19/17 210 lb (95.3 kg)  12/29/16 195 lb (88.5 kg)     Studies/Labs Reviewed:  EKG:  EKG is not ordered today.   Recent Labs: 06/30/2016: ALT 13; TSH 1.61 01/19/2017: BUN 15; Creatinine, Ser 0.96; Hemoglobin 15.0; Platelets 213; Potassium 4.1; Sodium 138   Lipid Panel    Component Value Date/Time   CHOL 93 06/30/2016 1117   CHOL 99 04/02/2013 0903   TRIG 86 06/30/2016 1117   TRIG 59 04/02/2013 0903   HDL 32 (L) 06/30/2016 1117   HDL 42 04/02/2013 0903   CHOLHDL 2.9 06/30/2016 1117   VLDL 17 06/30/2016 1117   LDLCALC 44 06/30/2016 1117   LDLCALC 45 04/02/2013 0903    Additional studies/ records that were reviewed today include:   Echocardiogram: 11/2016 Study Conclusions  - Left ventricle: The cavity size was normal. Wall thickness was   increased in a pattern of mild LVH. Systolic function was normal.   The estimated ejection fraction was in the range of 60% to 65%.   Wall motion was normal; there were no regional wall motion   abnormalities. Left ventricular diastolic function parameters   were normal. - Mitral valve: Mildly thickened leaflets . There was trivial   regurgitation. - Left atrium: The atrium was normal in size. - Right atrium: The atrium was mildly dilated. - Inferior vena cava: The vessel was normal in size. The   respirophasic diameter changes were in the normal range (>= 50%),   consistent with normal central venous pressure.  Impressions:  - LVEF  60-65%, mild LVH, normal wall motion, normal diastolic   function, trivial MR, normal LA size, mild RAE, normal IVC.  Assessment:    1. New onset atrial fibrillation ([place])   2. Coronary artery disease involving native coronary artery of native heart without angina pectoris   3. Essential hypertension   4. Hyperlipidemia with target LDL less than 70      Plan:   In order of problems listed above:  1. New-onset Atrial Fibrillation - recently evaluated at [facility] on 01/19/2017 for palpitations and found to be in atrial fibrillation with RVR, HR in the 140's. CBC and BMET showed no acute abnormalities and TSH was within normal limits in 06/2016. DCCV was pursued at 150J with return to NSR. - he has noticed worsening fatigue with Lopressor being increased from 25mg  BID to 50mg  BID. HR is in the low-50's during today's visit. Will reduce back to 25mg  BID and he can take an additional tablet as needed for palpitations.  - His CHA2DS2-VASc Score is 3 (CAD, HTN, DM). He has been started on Xarelto 20mg  daily with Plavix being discontinued. Continue ASA 81mg  daily.   2. CAD - he is s/p DES to LAD in 2005, patent by cath in 2007 with 30% LCx stenosis noted at that time. Last ischemic evaluation was a NST in 05/2011 which was normal. - He is very active at baseline and denies any recent chest pain or dyspnea on exertion. - continue ASA, BB, and Vytorin.   3. HTN - BP is well-controlled at 122/74 during today's visit. - continue Lisinopril 20mg  daily. Reduce Lopressor back to 25mg  BID.   4. HLD - Lipid Panel in 06/2016 showed total cholesterol of 93, HDL 32, and LDL 44. At goal with LDL < 70. - continue Vytorin. Will stop Lovaza as this is no longer covered well by his insurance. Recheck FLP at his next office visit.    Medication Adjustments/Labs and Tests Ordered: Current medicines are reviewed at length with the patient today.  Concerns regarding medicines are outlined above.  Medication  changes, Labs and Tests ordered today are listed in the Patient Instructions below. Patient Instructions  Medication Instructions:  STOP- LOVAZA DECREASE- Metoprolol 25 mg twice a day  If you need a refill on your cardiac medications before your next appointment, please call your pharmacy.  Labwork: None Ordered   Testing/Procedures: None Ordered  Follow-Up: Your physician wants you to follow-up in: 3 Months with Dr Claiborne Billings.   Thank you for choosing [facility] at [employer], Erma Heritage, PA-C  01/30/2017 1:31 PM    [facility] [address], [place], [place]  [address] Phone: [phone]; Fax: [phone]  [address] [place], [place] [address] Phone: [phone]

## 2017-01-30 ENCOUNTER — Ambulatory Visit (INDEPENDENT_AMBULATORY_CARE_PROVIDER_SITE_OTHER): Payer: PRIVATE HEALTH INSURANCE | Admitting: Student

## 2017-01-30 ENCOUNTER — Encounter: Payer: Self-pay | Admitting: Student

## 2017-01-30 VITALS — BP 122/74 | HR 54 | Ht 74.0 in | Wt 215.0 lb

## 2017-01-30 DIAGNOSIS — I4891 Unspecified atrial fibrillation: Secondary | ICD-10-CM

## 2017-01-30 DIAGNOSIS — I10 Essential (primary) hypertension: Secondary | ICD-10-CM

## 2017-01-30 DIAGNOSIS — I480 Paroxysmal atrial fibrillation: Secondary | ICD-10-CM | POA: Insufficient documentation

## 2017-01-30 DIAGNOSIS — I2510 Atherosclerotic heart disease of native coronary artery without angina pectoris: Secondary | ICD-10-CM

## 2017-01-30 DIAGNOSIS — E785 Hyperlipidemia, unspecified: Secondary | ICD-10-CM | POA: Diagnosis not present

## 2017-01-30 MED ORDER — RIVAROXABAN 20 MG PO TABS: 20.0000 mg | ORAL_TABLET | Freq: Every day | ORAL | 11 refills | Status: DC

## 2017-01-30 MED ORDER — NITROGLYCERIN 0.4 MG SL SUBL: 0.4000 mg | SUBLINGUAL_TABLET | SUBLINGUAL | 2 refills | Status: DC | PRN

## 2017-01-30 NOTE — Patient Instructions (Addendum)
Medication Instructions:  STOP- LOVAZA DECREASE- Metoprolol 25 mg twice a day  If you need a refill on your cardiac medications before your next appointment, please call your pharmacy.  Labwork: None Ordered   Testing/Procedures: None Ordered  Follow-Up: Your physician wants you to follow-up in: 3 Months with Dr Claiborne Billings.   Thank you for choosing [facility] at [facility]!!

## 2017-03-13 ENCOUNTER — Ambulatory Visit (INDEPENDENT_AMBULATORY_CARE_PROVIDER_SITE_OTHER): Payer: PRIVATE HEALTH INSURANCE | Admitting: Internal Medicine

## 2017-03-13 ENCOUNTER — Encounter: Payer: Self-pay | Admitting: Internal Medicine

## 2017-03-13 VITALS — BP 128/72 | HR 86 | Temp 98.0°F | Ht 74.0 in | Wt 216.6 lb

## 2017-03-13 DIAGNOSIS — I4891 Unspecified atrial fibrillation: Secondary | ICD-10-CM

## 2017-03-13 DIAGNOSIS — R42 Dizziness and giddiness: Secondary | ICD-10-CM

## 2017-03-13 DIAGNOSIS — E119 Type 2 diabetes mellitus without complications: Secondary | ICD-10-CM

## 2017-03-13 DIAGNOSIS — I2510 Atherosclerotic heart disease of native coronary artery without angina pectoris: Secondary | ICD-10-CM

## 2017-03-13 NOTE — Patient Instructions (Signed)
Report to the emergency room immediately if you develop any localized weakness numbness, difficulties with speech or any visual disturbances Follow-up with Dr. Elease Hashimoto next week  Report any new or worsening symptoms

## 2017-03-13 NOTE — Progress Notes (Signed)
Subjective:    Patient ID: Michael Livingston, male    DOB: 12/10/1960, 57 y.o.   MRN: [id number]  HPI 56 year old patient with a history of coronary artery disease and fairly recent onset of atrial fibrillation.  He presented to the emergency department on September 13 with palpitations and noted to be in atrial fibrillation with rapid ventricular response.  DC CV was successful and the patient has been on chronic anticoagulation with Xarelto.  He has been followed closely by cardiology. Approximately 2 hours ago he experienced an episode of dizziness.  He has a difficult time describing this sensation.  He states it feels like his blood pressure is low or his blood sugar is low.  He has a sense of being a bit unsteady and feels like he is in a fog.  He denies any focal neurological symptoms.  He does have diabetes controlled on metformin therapy only.  He denies any focal weakness visual disturbances or paresthesias. He does have a history of treated hypertension but no history of hypotension.  He has been compliant with his medications.  Past Medical History:  Diagnosis Date  . Allergy   . Atrial fibrillation ([place])    a. diagnosed in 01/2017 --> s/p DCCV. Started on Xarelto for anticoagulation.   Marland Kitchen CAD (coronary artery disease)    a. s/p DES to LAD in 2005 b. patent by cath in 2007 with 30% LCx stenosis noted at that time c. normal NST in 05/2011  . Chronic kidney disease    1 kidney stone  . Diabetes mellitus without complication (HCC)    type 2, no meds needed  . Hyperlipidemia   . Hypertension   . Myocardial infarction [facility])      Social History   Socioeconomic History  . Marital status: Married    Spouse name: Not on file  . Number of children: Not on file  . Years of education: Not on file  . Highest education level: Not on file  Social Needs  . Financial resource strain: Not on file  . Food insecurity - worry: Not on file  . Food insecurity - inability: Not on file  .  Transportation needs - medical: Not on file  . Transportation needs - non-medical: Not on file  Occupational History  . Not on file  Tobacco Use  . Smoking status: Former Smoker    Packs/day: 1.00    Years: 10.00    Pack years: 10.00    Start date: 07/16/1977    Last attempt to quit: 07/17/1987    Years since quitting: 29.6  . Smokeless tobacco: Never Used  Substance and Sexual Activity  . Alcohol use: No    Alcohol/week: 0.0 oz  . Drug use: No  . Sexual activity: Not on file  Other Topics Concern  . Not on file  Social History Narrative  . Not on file    Past Surgical History:  Procedure Laterality Date  . CARDIAC SURGERY  2005   stent  . CORONARY STENT PLACEMENT    . KNEE ARTHROSCOPY     left 2003, right 2005  . SHOULDER SURGERY      Family History  Problem Relation Age of Onset  . Diabetes Mother   . Heart disease Father   . Diabetes Father   . Heart disease Paternal Grandfather   . Colon cancer Neg Hx   . Esophageal cancer Neg Hx   . Stomach cancer Neg Hx   . Rectal cancer Neg Hx  Allergies  Allergen Reactions  . Aleve [Naproxen Sodium]     Swollen mouth  . Sulfa Antibiotics     hives    Current Outpatient Medications on File Prior to Visit  Medication Sig Dispense Refill  . aspirin EC 81 MG tablet Take 1 tablet (81 mg total) by mouth daily. 30 tablet 6  . ezetimibe-simvastatin (VYTORIN) 10-40 MG tablet Take 1 tablet by mouth daily. 90 tablet 3  . lisinopril (PRINIVIL,ZESTRIL) 20 MG tablet Take 1 tablet (20 mg total) by mouth daily. 90 tablet 3  . metFORMIN (GLUCOPHAGE XR) 750 MG 24 hr tablet Take 1 tablet (750 mg total) by mouth daily with breakfast. 90 tablet 3  . metoprolol tartrate (LOPRESSOR) 25 MG tablet Take 25 mg by mouth 2 (two) times daily.    . Multiple Vitamins-Minerals (MULTIVITAMIN WITH MINERALS) tablet Take 1 tablet by mouth daily.    . nitroGLYCERIN (NITROSTAT) 0.4 MG SL tablet Place 1 tablet (0.4 mg total) under the tongue every 5  (five) minutes as needed for chest pain. 25 tablet 2  . PAZEO 0.7 % SOLN INSTILL 1 DROP INTO INTO BOTH EYES EVERY MORNING AS NEEDED FOR ITCHING  4  . rivaroxaban (XARELTO) 20 MG TABS tablet Take 1 tablet (20 mg total) by mouth daily with supper. 30 tablet 11   No current facility-administered medications on file prior to visit.     BP 128/72 (BP Location: Left Arm, Patient Position: Sitting, Cuff Size: Normal)   Pulse 86   Temp 98 F (36.7 C) (Oral)   Ht 6\' 2"  (1.88 m)   Wt 216 lb 9.6 oz (98.2 kg)   SpO2 99%   BMI 27.81 kg/m       Review of Systems  Constitutional: Negative for appetite change, chills, fatigue and fever.  HENT: Negative for congestion, dental problem, ear pain, hearing loss, sore throat, tinnitus, trouble swallowing and voice change.   Eyes: Negative for pain, discharge and visual disturbance.  Respiratory: Negative for cough, chest tightness, wheezing and stridor.   Cardiovascular: Negative for chest pain, palpitations and leg swelling.  Gastrointestinal: Negative for abdominal distention, abdominal pain, blood in stool, constipation, diarrhea, nausea and vomiting.  Genitourinary: Negative for difficulty urinating, discharge, flank pain, genital sores, hematuria and urgency.  Musculoskeletal: Negative for arthralgias, back pain, gait problem, joint swelling, myalgias and neck stiffness.  Skin: Negative for rash.  Neurological: Positive for dizziness and light-headedness. Negative for syncope, speech difficulty, weakness, numbness and headaches.  Hematological: Negative for adenopathy. Does not bruise/bleed easily.  Psychiatric/Behavioral: Negative for behavioral problems and dysphoric mood. The patient is not nervous/anxious.        Objective:   Physical Exam  Constitutional: He is oriented to person, place, and time. He appears well-developed.  Blood pressure 124/70 without orthostatic changes Pulse regular  HENT:  Head: Normocephalic.  Right Ear:  External ear normal.  Left Ear: External ear normal.  Eyes: Conjunctivae and EOM are normal.  Neck: Normal range of motion.  Cardiovascular: Normal rate and normal heart sounds.  Pulmonary/Chest: Breath sounds normal.  Abdominal: Bowel sounds are normal.  Musculoskeletal: Normal range of motion. He exhibits no edema or tenderness.  Neurological: He is alert and oriented to person, place, and time. No cranial nerve deficit. Coordination normal.  No motor weakness Reflexes equal Cranial nerve examination normal Normal finger to nose testing Normal gait Patient is able to do a tandem walk without difficulty  Psychiatric: He has a normal mood and affect. His behavior  is normal.          Assessment & Plan:   Nonspecific symptom complex of dizziness and weakness.  Patient has a normal neurologic examination. Patient is understandably concerned about stroke symptoms. Options discussed.  It is felt that a brain MRI would have a very low yield.  Symptoms are very nonspecific and unlikely due to an acute embolic event.  Have elected to observe at this time.  He remains on full anticoagulation.  He will report any new or worsening symptoms.  The patient will report to the ED immediately if he develops any focal weakness numbness speech difficulties or visual disturbances  Nyoka Cowden

## 2017-03-21 ENCOUNTER — Encounter: Payer: Self-pay | Admitting: Family Medicine

## 2017-03-21 ENCOUNTER — Ambulatory Visit: Payer: PRIVATE HEALTH INSURANCE | Admitting: Family Medicine

## 2017-03-21 VITALS — BP 110/80 | HR 66 | Temp 98.1°F | Wt 219.2 lb

## 2017-03-21 DIAGNOSIS — R42 Dizziness and giddiness: Secondary | ICD-10-CM

## 2017-03-21 DIAGNOSIS — J209 Acute bronchitis, unspecified: Secondary | ICD-10-CM

## 2017-03-21 DIAGNOSIS — E119 Type 2 diabetes mellitus without complications: Secondary | ICD-10-CM | POA: Diagnosis not present

## 2017-03-21 LAB — POCT GLYCOSYLATED HEMOGLOBIN (HGB A1C): Hemoglobin A1C: 5.9

## 2017-03-21 MED ORDER — DOXYCYCLINE HYCLATE 100 MG PO CAPS: 100.0000 mg | ORAL_CAPSULE | Freq: Two times a day (BID) | ORAL | 0 refills | Status: DC

## 2017-03-21 NOTE — Patient Instructions (Signed)

## 2017-03-21 NOTE — Progress Notes (Signed)
Subjective:     Patient ID: Michael Livingston, male   DOB: [birth year], 56 y.o.   MRN: [id number]  HPI Patient is in for the following concerns  Recent evaluation for some vertigo type symptoms. Symptoms were triggered by head movement.   Mostly resolved at this time.  No syncope. No focal weakness. No speech changes. No visual changes.  He has increasing productive cough for the past several days. Also has some sinus congestive symptoms. Cough productive of green-yellow sputum. No definite fever. No hemoptysis. He has history of atrial fibrillation and is on Xarelto. No recent chest pains or dizziness.  History of type 2 diabetes treated with metformin. He is overdue for repeat A1c. Recent fasting blood sugars around 130 to 140.  Past Medical History:  Diagnosis Date  . Allergy   . Atrial fibrillation ([place])    a. diagnosed in 01/2017 --> s/p DCCV. Started on Xarelto for anticoagulation.   Marland Kitchen CAD (coronary artery disease)    a. s/p DES to LAD in 2005 b. patent by cath in 2007 with 30% LCx stenosis noted at that time c. normal NST in 05/2011  . Chronic kidney disease    1 kidney stone  . Diabetes mellitus without complication (HCC)    type 2, no meds needed  . Hyperlipidemia   . Hypertension   . Myocardial infarction [facility])    Past Surgical History:  Procedure Laterality Date  . CARDIAC SURGERY  2005   stent  . CORONARY STENT PLACEMENT    . KNEE ARTHROSCOPY     left 2003, right 2005  . SHOULDER SURGERY      reports that he quit smoking about 29 years ago. He started smoking about 39 years ago. He has a 10.00 pack-year smoking history. he has never used smokeless tobacco. He reports that he does not drink alcohol or use drugs. family history includes Diabetes in his father and mother; Heart disease in his father and paternal grandfather. Allergies  Allergen Reactions  . Aleve [Naproxen Sodium]     Swollen mouth  . Sulfa Antibiotics     hives       Review of Systems   Constitutional: Negative for fatigue and unexpected weight change.  HENT: Positive for sinus pain.   Eyes: Negative for visual disturbance.  Respiratory: Positive for cough. Negative for chest tightness and shortness of breath.   Cardiovascular: Negative for chest pain, palpitations and leg swelling.  Endocrine: Negative for polydipsia and polyuria.  Genitourinary: Negative for dysuria.  Neurological: Negative for syncope, weakness, light-headedness and headaches.       Objective:   Physical Exam  Constitutional: He is oriented to person, place, and time. He appears well-developed and well-nourished.  HENT:  Right Ear: External ear normal.  Left Ear: External ear normal.  Mouth/Throat: Oropharynx is clear and moist.  Neck: Neck supple.  Cardiovascular: Normal rate and regular rhythm.  Pulmonary/Chest: Effort normal and breath sounds normal. No respiratory distress. He has no wheezes. He has no rales.  Musculoskeletal: He exhibits no edema.  Lymphadenopathy:    He has no cervical adenopathy.  Neurological: He is alert and oriented to person, place, and time. No cranial nerve deficit.       Assessment:     #1 acute bronchitis and possible early sinusitis  #2 type 2 diabetes well controlled with repeat A1c today 5.9%  #3 recent transient vertigo improved  #4 history of A. fib. Appears to be in sinus rhythm at this time.  Plan:     -Continue metformin and continue to monitor blood sugars periodically -Start doxycycline 100 mg twice daily for 7 days -Follow-up immediately for any fever or increasing shortness of breath -Follow-up for any recurrent vertigo symptoms  Eulas Post MD  Primary Care at [facility]

## 2017-04-06 ENCOUNTER — Telehealth: Payer: Self-pay | Admitting: *Deleted

## 2017-04-06 ENCOUNTER — Telehealth: Payer: Self-pay | Admitting: Cardiovascular Disease

## 2017-04-06 NOTE — Telephone Encounter (Signed)
New message   Patient calling   Pt c/o medication issue:  1. Name of Medication: *rivaroxaban (XARELTO) 20 MG TABS tablet  2. How are you currently taking this medication (dosage and times per day)? Take 1 tablet (20 mg total) by mouth daily with supper.  3. Are you having a reaction (difficulty breathing--STAT)? No   4. What is your medication issue?  Recently bump his head pharmacy is concern about medication.

## 2017-04-06 NOTE — Telephone Encounter (Signed)
The patient called in again concerned about his head. He has been advised that if he is worried he can run by the office to have it assessed. He stated that he is an hour away. He has been advised to go to an urgent care so that he may have it assessed in person. He verbalized his understanding and stated he would if he felt bad.

## 2017-04-06 NOTE — Telephone Encounter (Signed)
Returned the call to the patient. He stated that he bumped his head while at work, resulting in a scratch. He was concerned due to education he received at the hospital that he may get internal bleeding. Pharmd has offered for him to come in for education on Xarelto but he declined. He has been instructed to watch for bleeding around the scratch area, persistent headache, vision impairment, and to call us back with any concerns. He verbalized his understanding. If he has any concerns he will have it assessed by a physician.

## 2017-04-27 ENCOUNTER — Encounter: Payer: Self-pay | Admitting: Cardiovascular Disease

## 2017-04-27 ENCOUNTER — Ambulatory Visit: Payer: PRIVATE HEALTH INSURANCE | Admitting: Cardiovascular Disease

## 2017-04-27 VITALS — BP 130/80 | HR 56 | Ht 74.0 in | Wt 217.0 lb

## 2017-04-27 DIAGNOSIS — E785 Hyperlipidemia, unspecified: Secondary | ICD-10-CM | POA: Diagnosis not present

## 2017-04-27 DIAGNOSIS — I10 Essential (primary) hypertension: Secondary | ICD-10-CM

## 2017-04-27 DIAGNOSIS — Z5181 Encounter for therapeutic drug level monitoring: Secondary | ICD-10-CM | POA: Diagnosis not present

## 2017-04-27 DIAGNOSIS — I2510 Atherosclerotic heart disease of native coronary artery without angina pectoris: Secondary | ICD-10-CM | POA: Diagnosis not present

## 2017-04-27 DIAGNOSIS — E119 Type 2 diabetes mellitus without complications: Secondary | ICD-10-CM

## 2017-04-27 DIAGNOSIS — Z7901 Long term (current) use of anticoagulants: Secondary | ICD-10-CM

## 2017-04-27 DIAGNOSIS — I4891 Unspecified atrial fibrillation: Secondary | ICD-10-CM

## 2017-04-27 NOTE — Progress Notes (Signed)
Patient ID: Michael Livingston, male   DOB: 11/07/1960, 56 y.o.   MRN: [id number]     HPI: Michael Livingston is a 56 y.o. male presents to the office for a 10 month cardiology evaluation.  Michael Livingston has CAD and in October 2005 underwent stenting to his proximal to mid left anterior descending artery. His last catheterization was in October 2007 and his LAD was  was widely patent. He did have mild 30% circumflex stenosis. A nuclear perfusion study in January 2013 continued to show normal perfusion.  Michael Livingston has a history of palpitations which have been controlled with beta blocker therapy. He has a history of hyperlipidemia with an atherogenic lipid panel with low HDL levels. An NMR lipoprofile in 2014 demonstrated increased insulin resistance and an increased wall LDL particles.  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 Since that time, he has lost over 20 pounds. He denies recent chest pain. He denies significant increase in palpitations except a rare episode.   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  He denies any exertional chest pain or dyspnea.  He denies palpitations.  He denies PND, orthopnea.  Last year he underwent orthopedic surgery to his shoulder and tolerated this well.    He was started on metformin for diabetes mellitus.  In June 2017 hemoglobin A1c was 6.4, and most recently in January 2018  improved to 5.7.  I last saw him, he did note mild shortness of breath with arm raising and denied exertional chest pain or palpitations.  He was on aspirin and Plavix for dual antiplatelet therapy.  He was on ramipril 5 mg, metoprolol 25 mg twice a day for hypertension and Vytorin 1044.  Hyperlipidemia.  Heme notes mild shortness of breath when he raises his arms.  He denies exertional chest pain symptoms or exertional dyspnea.  He continues to be on aspirin and Plavix for  dual antiplatelet therapy.  He continues to be on ram a pro-5 mg, metoprolol 25 mg twice a day for hypertension.  He is on Vytorin 10/40 for hyperlipidemia, as well as lovaza.  Since I last saw him, he developed new onset atrial fibrillation on 01/19/2017.  [facility] went to [facility].  CBC be met were normal as was his TSH.  He underwent successful DC cardioversion at 150 J with restoration of sinus rhythm.  His Lopressor dose was increased to 30 mg twice a day, and he was started on Xarelto 20 mg for a cha2ds2score of 3 (CAD, hypertension, diabetes mellitus.  His Plavix was discontinued.  He saw Bernerd Pho for office follow-up.  He was doing well without recurrence.  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  Asked month, he really banged his head on a piling in the attic and as result, he is very concerned about potential work-related trauma, particularly while on Xarelto.  He is unaware of any recurrence of atrial fibrillation.  He presents for evaluation.  Past Medical History:  Diagnosis Date  . Allergy   . Atrial fibrillation ([place])    a. diagnosed in 01/2017 --> s/p DCCV. Started on Xarelto for anticoagulation.   Marland Kitchen CAD (coronary artery disease)    a. s/p DES to LAD in 2005 b. patent by cath in 2007 with 30% LCx stenosis noted at that time  c. normal NST in 05/2011  . Chronic kidney disease    1 kidney stone  . Diabetes mellitus without complication (HCC)    type 2, no meds needed  . Hyperlipidemia   . Hypertension   . Myocardial infarction [facility])     Past Surgical History:  Procedure Laterality Date  . CARDIAC SURGERY  2005   stent  . CORONARY STENT PLACEMENT    . KNEE ARTHROSCOPY     left 2003, right 2005  . SHOULDER SURGERY      Allergies  Allergen Reactions  . Aleve [Naproxen Sodium]     Swollen mouth  . Sulfa Antibiotics     hives    Current Outpatient Medications  Medication Sig Dispense Refill  . aspirin EC 81 MG  tablet Take 1 tablet (81 mg total) by mouth daily. 30 tablet 6  . doxycycline (VIBRAMYCIN) 100 MG capsule Take 1 capsule (100 mg total) 2 (two) times daily by mouth. 14 capsule 0  . ezetimibe-simvastatin (VYTORIN) 10-40 MG tablet Take 1 tablet by mouth daily. 90 tablet 3  . metFORMIN (GLUCOPHAGE XR) 750 MG 24 hr tablet Take 1 tablet (750 mg total) by mouth daily with breakfast. 90 tablet 3  . metoprolol tartrate (LOPRESSOR) 25 MG tablet Take 25 mg by mouth 2 (two) times daily.    . Multiple Vitamins-Minerals (MULTIVITAMIN WITH MINERALS) tablet Take 1 tablet by mouth daily.    . nitroGLYCERIN (NITROSTAT) 0.4 MG SL tablet Place 1 tablet (0.4 mg total) under the tongue every 5 (five) minutes as needed for chest pain. 25 tablet 2  . PAZEO 0.7 % SOLN INSTILL 1 DROP INTO INTO BOTH EYES EVERY MORNING AS NEEDED FOR ITCHING  4  . rivaroxaban (XARELTO) 20 MG TABS tablet Take 1 tablet (20 mg total) by mouth daily with supper. 30 tablet 11  . lisinopril (PRINIVIL,ZESTRIL) 20 MG tablet Take 1 tablet (20 mg total) by mouth daily. 90 tablet 3   No current facility-administered medications for this visit.     Social History   Socioeconomic History  . Marital status: Married    Spouse name: Not on file  . Number of children: Not on file  . Years of education: Not on file  . Highest education level: Not on file  Social Needs  . Financial resource strain: Not on file  . Food insecurity - worry: Not on file  . Food insecurity - inability: Not on file  . Transportation needs - medical: Not on file  . Transportation needs - non-medical: Not on file  Occupational History  . Not on file  Tobacco Use  . Smoking status: Former Smoker    Packs/day: 1.00    Years: 10.00    Pack years: 10.00    Start date: 07/16/1977    Last attempt to quit: 07/17/1987    Years since quitting: 29.8  . Smokeless tobacco: Never Used  Substance and Sexual Activity  . Alcohol use: No    Alcohol/week: 0.0 oz  . Drug use: No    . Sexual activity: Not on file  Other Topics Concern  . Not on file  Social History Narrative  . Not on file    Family History  Problem Relation Age of Onset  . Diabetes Mother   . Heart disease Father   . Diabetes Father   . Heart disease Paternal Grandfather   . Colon cancer Neg Hx   . Esophageal cancer Neg Hx   . Stomach cancer Neg Hx   .  Rectal cancer Neg Hx    Social history is notable that he is married and has 2 children. There is no tobacco or alcohol use. He has an autistic son.   ROS General: Negative; No fevers, chills, or night sweats; there is purposeful weight loss HEENT: Negative; No changes in vision or hearing, sinus congestion, difficulty swallowing Pulmonary: Negative; No cough, wheezing, shortness of breath, hemoptysis Cardiovascular: See history of present illness: No presyncope, syncope, palpitations GI: Negative; No nausea, vomiting, diarrhea, or abdominal pain GU: Negative; No dysuria, hematuria, or difficulty voiding Musculoskeletal: Left shoulder labrum tear Hematologic/Oncology: Negative; no easy bruising, bleeding Endocrine: Negative; no heat/cold intolerance; no diabetes Neuro: Negative; no changes in balance, headaches Skin: Negative; No rashes or skin lesions Psychiatric: Negative; No behavioral problems, depression Sleep: Negative; No snoring, daytime sleepiness, hypersomnolence, bruxism, restless legs, hypnogognic hallucinations, no cataplexy Other comprehensive 14 point system review is negative.   PE BP 130/80   Pulse (!) 56   Ht _0  (1.88 m)   Wt 217 lb (98.4 kg)   BMI 27.86 kg/m    Repeat blood pressure by me was 120/64 supine and 1118/68 standing.  Wt Readings from Last 3 Encounters:  04/27/17 217 lb (98.4 kg)  03/21/17 219 lb 3.2 oz (99.4 kg)  03/13/17 216 lb 9.6 oz (98.2 kg)   General: Alert, oriented, no distress.  Skin: normal turgor, no rashes HEENT: Normocephalic, atraumatic. Pupils round and reactive; sclera  anicteric;no lid lag.  Nose without nasal septal hypertrophy Mouth/Parynx benign; Mallinpatti scale 2 Neck: No JVD, no carotid bruits .  Normal carotid upstroke Lungs: clear to ausculatation and percussion; no wheezing or rales Chest wall: Nontender to palpation Heart: RRR, s1 s2 normal; no S3 or S4 gallop.  No rubs thrills or heaves. Abdomen: soft, nontender; no hepatosplenomehaly, BS+; abdominal aorta nontender and not dilated by palpation. Pulses 2+ Back: No CVA tenderness Extremities: no clubbing cyanosis or edema, Homan's sign negative  Neurologic: grossly nonfocal Psychologic: normal affect and mood.  ECG (independently read by me):  Sinus bradycardia at 55 bpm.  PR interal 168 ms.  QTc inteval 417 ms. No ST-T changes.  February 2018 ECG (independently read by me): Sinus bradycardia at 48 bpm.  No ST segment changes.  Normal intervals.  June 2017 ECG (independently read by me): Sinus bradycardia 58 bpm.  Normal intervals.  No ST segment changes.  September 2016 ECG (independently read by me): Sinus bradycardia 58 bpm.  Mild RV conduction delay.  September 2015 ECG:( Independently read by me): Sinus bradycardia 50 beats per minute.  No ST segment changes.  Normal intervals.   November 2014 ECG: Sinus bradycardia at 45 beats per minute. No significant ST-T change.  LABS: BMP Latest Ref Rng & Units 01/19/2017 06/30/2016 10/20/2015  Glucose 65 - 99 mg/dL 134(H) 115(H) 143(H)  BUN 6 - 20 mg/dL _1 Creatinine 0.61 - 1.24 mg/dL 0.96 0.95 0.81  Sodium 135 - 145 mmol/L 138 139 137  Potassium 3.5 - 5.1 mmol/L 4.1 4.6 4.5  Chloride 101 - 111 mmol/L 107 104 105  CO2 22 - 32 mmol/L _2 Calcium 8.9 - 10.3 mg/dL 9.1 9.5 9.4   Hepatic Function Latest Ref Rng & Units 06/30/2016 10/20/2015 02/05/2015  Total Protein 6.1 - 8.1 g/dL 7.2 7.1 7.2  Albumin 3.6 - 5.1 g/dL 4.4 4.5 4.7  AST 10 - 35 U/L _3 ALT 9 - 46 U/L _4 Alk Phosphatase 40 -  115 U/L 56 59 60  Total  Bilirubin 0.2 - 1.2 mg/dL 1.2 1.0 1.0   CBC Latest Ref Rng & Units 01/19/2017 06/30/2016 10/20/2015  WBC 4.0 - 10.5 K/uL 7.2 9.2 4.8  Hemoglobin 13.0 - 17.0 g/dL 15.0 14.9 14.7  Hematocrit 39.0 - 52.0 % 42.3 43.3 42.1  Platelets 150 - 400 K/uL 213 208 201   Lab Results  Component Value Date   MCV 90.8 01/19/2017   MCV 92.3 06/30/2016   MCV 91.3 10/20/2015   Lab Results  Component Value Date   TSH 1.61 06/30/2016   Lab Results  Component Value Date   HGBA1C 5.9 03/21/2017   Lipid Panel     Component Value Date/Time   CHOL 93 06/30/2016 1117   CHOL 99 04/02/2013 0903   TRIG 86 06/30/2016 1117   TRIG 59 04/02/2013 0903   HDL 32 (L) 06/30/2016 1117   HDL 42 04/02/2013 0903   CHOLHDL 2.9 06/30/2016 1117   VLDL 17 06/30/2016 1117   LDLCALC 44 06/30/2016 1117   LDLCALC 45 04/02/2013 0903     RADIOLOGY: No results found.  IMPRESSION:  1. New onset atrial fibrillation ([place])   2. Coronary artery disease involving native coronary artery of native heart without angina pectoris   3. Essential hypertension   4. Hyperlipidemia with target LDL less than 70   5. Type 2 diabetes mellitus without complication, without long-term current use of insulin ([place])   6. Alteration in anticoagulation     ASSESSMENT AND PLAN: Michael Livingston is a 56 year old white male who is 13 years status post stenting to his proximal to mid LAD in October 2005.  At his last catheterization in October 2007 his LAD was widely patent and he had mild 30% narrowing.  A nuclear perfusion study in January 2013 continued to show normal perfusion.  He has not had any recurrent anginal symptomatology.  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  He apparently developed an episode of atrial fibrillation with RVR leading to a 01/19/2017 ER evaluation.  He had new onset of symptomatology and was successfully cardioverted in the emergency room.  At that time his beta blocker regimen was increased and he was  started on Xarelto.  He suddenly discontinued Plavix.  He is now very concerned about potential work-related accident.  Since he had his head very hard in an attic.  Fortunately not have any laceration or develop any hematoma or brain hemorrhage.  Since this was Michael Livingston only episode of atrial fibrillation I understand his concern for anticoagulation.    I have recommended he discontinue Xarelto and resume Plavix in 3 days.  However, we discussed if he were to develop recurrent atrial fibrillation.  He most likely will require long-term systemic anticoagulation.  I reviewed his recent laboratory from his ER evaluation.  He will continue to increase metoprolol and he continues to take lisinopril 20 mg daily.  He is diabetic on metformin 750 mg daily.  He continues to be on Vytorin 1044.  Hyperlipidemia.  A February 2018 LDL was 44.  He is no longer taking omega-3 fatty acid.  Since his triglycerides had gone down to 86.  I will see him in 3 months for reevaluation.  Troy Sine, MD, [facility]  04/28/2017 5:50 PM

## 2017-04-27 NOTE — Patient Instructions (Signed)
Your physician has recommended you make the following change in your medication:  STOP XARELTO   START PLAVIX 75 MG IN 3 DAYS  Your physician recommends that you schedule a follow-up appointment in: [place]

## 2017-04-28 ENCOUNTER — Encounter: Payer: Self-pay | Admitting: Cardiovascular Disease

## 2017-05-11 ENCOUNTER — Telehealth: Payer: Self-pay | Admitting: Family Medicine

## 2017-05-11 DIAGNOSIS — M549 Dorsalgia, unspecified: Secondary | ICD-10-CM

## 2017-05-11 NOTE — Telephone Encounter (Signed)
CRM for notification. See Telephone encounter for:   05/11/17.   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

## 2017-05-12 NOTE — Telephone Encounter (Signed)
Ok to refer.  Does he have specific specialist in mind?

## 2017-05-15 NOTE — Telephone Encounter (Signed)
Spoke with patient and referral placed

## 2017-06-08 ENCOUNTER — Encounter: Payer: Self-pay | Admitting: Family Medicine

## 2017-06-08 ENCOUNTER — Ambulatory Visit (INDEPENDENT_AMBULATORY_CARE_PROVIDER_SITE_OTHER): Payer: PRIVATE HEALTH INSURANCE

## 2017-06-08 ENCOUNTER — Ambulatory Visit: Payer: PRIVATE HEALTH INSURANCE | Admitting: Family Medicine

## 2017-06-08 VITALS — BP 130/78 | HR 86 | Temp 99.9°F | Wt 223.9 lb

## 2017-06-08 DIAGNOSIS — B9789 Other viral agents as the cause of diseases classified elsewhere: Secondary | ICD-10-CM

## 2017-06-08 DIAGNOSIS — J069 Acute upper respiratory infection, unspecified: Secondary | ICD-10-CM

## 2017-06-08 LAB — POCT INFLUENZA A/B
INFLUENZA A, POC: NEGATIVE
INFLUENZA B, POC: NEGATIVE

## 2017-06-08 MED ORDER — FLUTICASONE PROPIONATE 50 MCG/ACT NA SUSP: 1.0000 | Freq: Every day | NASAL | 0 refills | Status: DC

## 2017-06-08 NOTE — Progress Notes (Signed)
Subjective:    Patient ID: Michael Livingston, male    DOB: [birth year], 57 y.o.   MRN: [id number]  No chief complaint on file.   HPI Patient was seen today for acute concern.  Patient endorses sore throat, productive cough, weakness, soreness, dizziness, T-max 104 which started yesterday early morning.  Patient endorses taking Mucinex cold and flu.  Patient's wife is a nurse who placed ice packs under his arms and advised him to take a cold shower.  Patient is a owns a Ambulance person.  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  Patient advised to stay home from work.  Past Medical History:  Diagnosis Date  . Allergy   . Atrial fibrillation ([place])    a. diagnosed in 01/2017 --> s/p DCCV. Started on Xarelto for anticoagulation.   Marland Kitchen CAD (coronary artery disease)    a. s/p DES to LAD in 2005 b. patent by cath in 2007 with 30% LCx stenosis noted at that time c. normal NST in 05/2011  . Chronic kidney disease    1 kidney stone  . Diabetes mellitus without complication (HCC)    type 2, no meds needed  . Hyperlipidemia   . Hypertension   . Myocardial infarction (HCC)     Allergies  Allergen Reactions  . Aleve [Naproxen Sodium]     Swollen mouth  . Sulfa Antibiotics     hives    ROS General: Denies fever, chills, night sweats, changes in weight, changes in appetite  + fatigue, lethargy, dizzy HEENT: Denies headaches, ear pain, changes in vision, rhinorrhea + sore throat CV: Denies CP, palpitations, SOB, orthopnea Pulm: Denies SOB, wheezing  + productive cough GI: Denies abdominal pain, nausea, vomiting, diarrhea, constipation GU: Denies dysuria, hematuria, frequency, vaginal discharge Msk: Denies muscle cramps, joint pains  + myalgias Neuro: Denies weakness, numbness, tingling Skin: Denies rashes, bruising Psych: Denies depression, anxiety, hallucinations     Objective:    Blood pressure 130/78, pulse 86, temperature 99.9 F (37.7 C), temperature source  Oral, weight 223 lb 14.4 oz (101.6 kg), SpO2 96 %.   Gen. Pleasant, well-nourished, in no distress, normal affect   HEENT: [place]/AT, face symmetric, conjunctiva clear, no scleral icterus, PERRLA, EOMI, nares patent without drainage, pharynx without erythema or exudate.  TMs full bilaterally.  Mild cervical lymphadenopathy Neck: No JVD, no thyromegaly Lungs: no accessory muscle use, CTAB, no wheezes or rales Cardiovascular: RRR, no m/r/g, no peripheral edema Abdomen: BS present, soft, NT/ND, no hepatosplenomegaly. Musculoskeletal: No deformities, no cyanosis or clubbing, normal tone Neuro:  A&Ox3, CN II-XII intact, normal gait Skin:  Warm, no lesions/ rash.    Wt Readings from Last 3 Encounters:  06/08/17 223 lb 14.4 oz (101.6 kg)  04/27/17 217 lb (98.4 kg)  03/21/17 219 lb 3.2 oz (99.4 kg)    Lab Results  Component Value Date   WBC 7.2 01/19/2017   HGB 15.0 01/19/2017   HCT 42.3 01/19/2017   PLT 213 01/19/2017   GLUCOSE 134 (H) 01/19/2017   CHOL 93 06/30/2016   TRIG 86 06/30/2016   HDL 32 (L) 06/30/2016   LDLCALC 44 06/30/2016   ALT 13 06/30/2016   AST 16 06/30/2016   NA 138 01/19/2017   K 4.1 01/19/2017   CL 107 01/19/2017   CREATININE 0.96 01/19/2017   BUN 15 01/19/2017   CO2 25 01/19/2017   TSH 1.61 06/30/2016   HGBA1C 5.9 03/21/2017    Assessment/Plan:  Viral URI with  cough -Rapid flu test negative -supportive care -encouraged to alternate tylenol and ibuprofen prn for pain, fever, discomfort. -Gargle throat with warm salt water or Chloraseptic spray -Rx for Flonase sent to pharmacy.  Offered patient prescription cough medicine but he declines at this time. -Patient advised to stay home from work for the next 2 days.  -Given elevated temperature and discomfort with coughing will obtain chest x-ray to rule out pneumonia. - Plan: POCT Influenza A/B, DG Chest 2 View, fluticasone (FLONASE) 50 MCG/ACT nasal spray -Of note patient has a history of new onset A. fib.   Currently he has a regular rhythm.  Concern he may go into a regular rhythm continued high fever.  On Xarelto. -f/u prn   Grier Mitts, MD

## 2017-06-08 NOTE — Patient Instructions (Addendum)
[facility] [address]  [place], [place] [address]  [phone]  Viral Respiratory Infection A respiratory infection is an illness that affects part of the respiratory system, such as the lungs, nose, or throat. Most respiratory infections are caused by either viruses or bacteria. A respiratory infection that is caused by a virus is called a viral respiratory infection. Common types of viral respiratory infections include:  A cold.  The flu (influenza).  A respiratory syncytial virus (RSV) infection.  How do I know if I have a viral respiratory infection? Most viral respiratory infections cause:  A stuffy or runny nose.  Yellow or green nasal discharge.  A cough.  Sneezing.  Fatigue.  Achy muscles.  A sore throat.  Sweating or chills.  A fever.  A headache.  How are viral respiratory infections treated? If influenza is diagnosed early, it may be treated with an antiviral medicine that shortens the length of time a person has symptoms. Symptoms of viral respiratory infections may be treated with over-the-counter and prescription medicines, such as:  Expectorants. These make it easier to cough up mucus.  Decongestant nasal sprays.  Health care providers do not prescribe antibiotic medicines for viral infections. This is because antibiotics are designed to kill bacteria. They have no effect on viruses. How do I know if I should stay home from work or school? To avoid exposing others to your respiratory infection, stay home if you have:  A fever.  A persistent cough.  A sore throat.  A runny nose.  Sneezing.  Muscles aches.  Headaches.  Fatigue.  Weakness.  Chills.  Sweating.  Nausea.  Follow these instructions at home:  Rest as much as possible.  Take over-the-counter and prescription medicines only as told by your health care provider.  Drink enough fluid to keep your urine clear or pale yellow. This helps prevent  dehydration and helps loosen up mucus.  Gargle with a salt-water mixture 3-4 times per day or as needed. To make a salt-water mixture, completely dissolve -1 tsp of salt in 1 cup of warm water.  Use nose drops made from salt water to ease congestion and soften raw skin around your nose.  Do not drink alcohol.  Do not use tobacco products, including cigarettes, chewing tobacco, and e-cigarettes. If you need help quitting, ask your health care provider. Contact a health care provider if:  Your symptoms last for 10 days or longer.  Your symptoms get worse over time.  You have a fever.  You have severe sinus pain in your face or forehead.  The glands in your jaw or neck become very swollen. Get help right away if:  You feel pain or pressure in your chest.  You have shortness of breath.  You faint or feel like you will faint.  You have severe and persistent vomiting.  You feel confused or disoriented. This information is not intended to replace advice given to you by your health care provider. Make sure you discuss any questions you have with your health care provider. Document Released: 02/02/2005 Document Revised: 10/01/2015 Document Reviewed: 10/01/2014 Elsevier Interactive Patient Education  [employer].

## 2017-06-09 ENCOUNTER — Other Ambulatory Visit: Payer: Self-pay | Admitting: Family Medicine

## 2017-06-09 ENCOUNTER — Telehealth: Payer: Self-pay | Admitting: Emergency Medicine

## 2017-06-09 DIAGNOSIS — J181 Lobar pneumonia, unspecified organism: Principal | ICD-10-CM

## 2017-06-09 DIAGNOSIS — J189 Pneumonia, unspecified organism: Secondary | ICD-10-CM

## 2017-06-09 MED ORDER — AZITHROMYCIN 250 MG PO TABS
ORAL_TABLET | ORAL | 0 refills | Status: DC
Start: 2017-06-09 — End: 2017-07-27

## 2017-06-09 NOTE — Telephone Encounter (Signed)
Patient called and would like the x ray results from his chest x ray on the 31st of Jan. Please advise

## 2017-06-12 ENCOUNTER — Telehealth: Payer: Self-pay | Admitting: Family Medicine

## 2017-06-12 ENCOUNTER — Other Ambulatory Visit: Payer: Self-pay | Admitting: Cardiovascular Disease

## 2017-06-12 MED ORDER — METOPROLOL TARTRATE 25 MG PO TABS: 25.0000 mg | ORAL_TABLET | Freq: Two times a day (BID) | ORAL | 3 refills | Status: DC

## 2017-06-12 NOTE — Telephone Encounter (Signed)
°*  STAT* If patient is at the pharmacy, call can be transferred to refill team.   1. Which medications need to be refilled? (please list name of each medication and dose if known) Metoprolol Tartrate 25 mg  2. Which pharmacy/location (including street and city if local pharmacy) is medication to be sent to?[place]  3. Do they need a 30 day or 90 day supply? [place]

## 2017-06-12 NOTE — Telephone Encounter (Signed)
Pt  Returned  Call  He  Still  Has  Pills  Left     He   Stated  He  Will call back  For refill  Request   In  10  Days

## 2017-06-12 NOTE — Telephone Encounter (Signed)
Copied from [place]. Topic: Quick Communication - Rx Refill/Question >> Jun 12, 2017 11:38 AM Ahmed Prima L wrote: Medication:   metFORMIN (GLUCOPHAGE XR) 750 MG 24 hr tablet   Has the patient contacted their pharmacy? Yes they told him to call the office   (Agent: If no, request that the patient contact the pharmacy for the refill.)   Preferred Pharmacy (with phone number or street name): [place], [place]: Please be advised that RX refills may take up to 3 business days. We ask that you follow-up with your pharmacy.

## 2017-06-12 NOTE — Telephone Encounter (Signed)
Rx(s) sent to pharmacy electronically.  

## 2017-06-12 NOTE — Telephone Encounter (Signed)
Pharmacist     At   [facility]   Where  Pts   Rx is  On  File  Stated  It  Was  10 days  To  Early   To  Refill  The   Glucophage  Xr   For insurance  Purposes    Pharmacist  Advised  Pt to  Count  His  meds  And  Find  Out  How    Many  He  Had  Left   Message  Left  For  Patient to call  Back  With  Details

## 2017-06-13 ENCOUNTER — Other Ambulatory Visit: Payer: Self-pay | Admitting: Cardiovascular Disease

## 2017-06-13 MED ORDER — EZETIMIBE-SIMVASTATIN 10-40 MG PO TABS: 1.0000 | ORAL_TABLET | Freq: Every day | ORAL | 3 refills | Status: DC

## 2017-06-13 NOTE — Telephone Encounter (Signed)
Pt's medication was sent to pt's pharmacy as requested. Confirmation received.  °

## 2017-06-14 NOTE — Telephone Encounter (Signed)
Please advise pt an attempt was made to contact him on 06/09/17.  An rx for Azithromycin was sent to his pharmacy on the same day for pneumonia.  Recommend a repeat cxr to assess resolution in 6 wks.

## 2017-06-14 NOTE — Telephone Encounter (Signed)
Patient has already been advise of imaging results and to take antibiotic. Nothing further needed

## 2017-07-10 ENCOUNTER — Other Ambulatory Visit: Payer: Self-pay | Admitting: Family Medicine

## 2017-07-10 NOTE — Telephone Encounter (Signed)
Copied from [place] [id number]. Topic: Quick Communication - Rx Refill/Question >> Jul 10, 2017 10:45 AM Bea Graff, NT wrote: Medication: metFORMIN (GLUCOPHAGE XR)   Has the patient contacted their pharmacy? Yes.     (Agent: If no, request that the patient contact the pharmacy for the refill.)   Preferred Pharmacy (with phone number or street name): [facility] in [place]: Please be advised that RX refills may take up to 3 business days. We ask that you follow-up with your pharmacy.

## 2017-07-11 ENCOUNTER — Telehealth: Payer: Self-pay | Admitting: Family Medicine

## 2017-07-11 MED ORDER — METFORMIN HCL ER 750 MG PO TB24: 750.0000 mg | ORAL_TABLET | Freq: Every day | ORAL | 3 refills | Status: DC

## 2017-07-11 NOTE — Telephone Encounter (Signed)
Copied from [place] [id number]. Topic: General - Other >> Jul 11, 2017  1:20 PM Darl Householder, RMA wrote: Reason for CRM: Medication refill request for metFORMIN (GLUCOPHAGE XR) 750 MG 24 hr tablet to be sent to [facility]

## 2017-07-11 NOTE — Telephone Encounter (Signed)
Called patient to let him know to call [facility] and have them transfer his metformin to them from [facility].

## 2017-07-12 ENCOUNTER — Other Ambulatory Visit: Payer: Self-pay | Admitting: *Deleted

## 2017-07-12 MED ORDER — METFORMIN HCL ER 750 MG PO TB24: 750.0000 mg | ORAL_TABLET | Freq: Every day | ORAL | 3 refills | Status: DC

## 2017-07-12 NOTE — Progress Notes (Signed)
Metformin re sent to [facility]

## 2017-07-27 ENCOUNTER — Telehealth: Payer: Self-pay | Admitting: Cardiovascular Disease

## 2017-07-27 ENCOUNTER — Ambulatory Visit (INDEPENDENT_AMBULATORY_CARE_PROVIDER_SITE_OTHER): Payer: PRIVATE HEALTH INSURANCE | Admitting: Cardiovascular Disease

## 2017-07-27 ENCOUNTER — Encounter: Payer: Self-pay | Admitting: Cardiovascular Disease

## 2017-07-27 VITALS — BP 126/70 | HR 61 | Ht 74.0 in | Wt 219.0 lb

## 2017-07-27 DIAGNOSIS — Z79899 Other long term (current) drug therapy: Secondary | ICD-10-CM

## 2017-07-27 DIAGNOSIS — E119 Type 2 diabetes mellitus without complications: Secondary | ICD-10-CM | POA: Diagnosis not present

## 2017-07-27 DIAGNOSIS — I480 Paroxysmal atrial fibrillation: Secondary | ICD-10-CM

## 2017-07-27 DIAGNOSIS — E785 Hyperlipidemia, unspecified: Secondary | ICD-10-CM

## 2017-07-27 DIAGNOSIS — I2510 Atherosclerotic heart disease of native coronary artery without angina pectoris: Secondary | ICD-10-CM | POA: Diagnosis not present

## 2017-07-27 DIAGNOSIS — I10 Essential (primary) hypertension: Secondary | ICD-10-CM | POA: Diagnosis not present

## 2017-07-27 MED ORDER — METOPROLOL SUCCINATE ER 25 MG PO TB24: 25.0000 mg | ORAL_TABLET | Freq: Every day | ORAL | 3 refills | Status: DC

## 2017-07-27 NOTE — Progress Notes (Signed)
Patient ID: Egon Dittus, male   DOB: 05/14/1960, 57 y.o.   MRN: [id number]     HPI: Cobe Viney is a 57 y.o. male presents to the office for a 3 month cardiology evaluation.  Mr. Niehoff has CAD and in October 2005 underwent stenting to his proximal to mid left anterior descending artery. His last catheterization was in October 2007 and his LAD was  was widely patent. He did have mild 30% circumflex stenosis. A nuclear perfusion study in January 2013 continued to show normal perfusion.  Mr. Reason has a history of palpitations which have been controlled with beta blocker therapy. He has a history of hyperlipidemia with an atherogenic lipid panel with low HDL levels. An NMR lipoprofile in 2014 demonstrated increased insulin resistance and an increased wall LDL particles.  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 Since that time, he has lost over 20 pounds. He denies recent chest pain. He denies significant increase in palpitations except a rare episode.   He is no longer is working for [employer], but is now owns a [employer].  He denies any exertional chest pain or dyspnea.  He denies palpitations.  He denies PND, orthopnea.  Last year he underwent orthopedic surgery to his shoulder and tolerated this well.    He was started on metformin for diabetes mellitus.  In June 2017 hemoglobin A1c was 6.4, and most recently in January 2018  improved to 5.7.  I last saw him, he did note mild shortness of breath with arm raising and denied exertional chest pain or palpitations.  He was on aspirin and Plavix for dual antiplatelet therapy.  He was on ramipril 5 mg, metoprolol 25 mg twice a day for hypertension and Vytorin 1044.  Hyperlipidemia.  Heme notes mild shortness of breath when he raises his arms.  He denies exertional chest pain symptoms or exertional dyspnea.  He continues to be on aspirin and Plavix for  dual antiplatelet therapy.  He continues to be on ram a pro-5 mg, metoprolol 25 mg twice a day for hypertension.  He is on Vytorin 10/40 for hyperlipidemia, as well as lovaza.  Since I last saw him, he developed new onset atrial fibrillation on 01/19/2017.  [facility] went to [facility].  CBC be met were normal as was his TSH.  He underwent successful DC cardioversion at 150 J with restoration of sinus rhythm.  His Lopressor dose was increased to 30 mg twice a day, and he was started on Xarelto 20 mg for a cha2ds2score of 3 (CAD, hypertension, diabetes mellitus.  His Plavix was discontinued.  He saw Bernerd Pho for office follow-up.  He was doing well without recurrence.  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  The month previously, he had significantly banged his head on a piling intrahepatic and as result was very concerned about work-related trauma, particularly while being on Xarelto. He was unaware of any recurrent episodes of arrhythmia.  We had a lengthy discussion about risk-benefit ratio of anticoagulation, particularly with his job.  He preferred not continue Xarelto due to his job risk.  As result, Xarelto was discontinued and he was resumed back on aspirin 81 mg and Plavix 75 mg. I increased metoprolol recommended he continue to take lisinopril 20 mg daily.  If he were to  develop any recurrent episodes of AFeinitiation of anticoagulation would be necessary.  Presently, he continues to remain stable.  He denies any recurrent arrhythmia.  At times he has been missing his evening dose of metoprolol tartrate. He denies any recurrent chest pain or shortness of breath or palpitations. He presents for evaluation.  Past Medical History:  Diagnosis Date  . Allergy   . Atrial fibrillation ([place])    a. diagnosed in 01/2017 --> s/p DCCV. Started on Xarelto for anticoagulation.   Marland Kitchen CAD (coronary artery disease)    a. s/p  DES to LAD in 2005 b. patent by cath in 2007 with 30% LCx stenosis noted at that time c. normal NST in 05/2011  . Chronic kidney disease    1 kidney stone  . Diabetes mellitus without complication (HCC)    type 2, no meds needed  . Hyperlipidemia   . Hypertension   . Myocardial infarction [facility])     Past Surgical History:  Procedure Laterality Date  . CARDIAC SURGERY  2005   stent  . CORONARY STENT PLACEMENT    . KNEE ARTHROSCOPY     left 2003, right 2005  . SHOULDER SURGERY      Allergies  Allergen Reactions  . Aleve [Naproxen Sodium]     Swollen mouth  . Sulfa Antibiotics     hives    Current Outpatient Medications  Medication Sig Dispense Refill  . aspirin EC 81 MG tablet Take 1 tablet (81 mg total) by mouth daily. 30 tablet 6  . clopidogrel (PLAVIX) 75 MG tablet Take 75 mg by mouth daily.    Marland Kitchen ezetimibe-simvastatin (VYTORIN) 10-40 MG tablet Take 1 tablet by mouth daily. 90 tablet 3  . fluticasone (FLONASE) 50 MCG/ACT nasal spray Place 1 spray into both nostrils daily. 16 g 0  . metFORMIN (GLUCOPHAGE XR) 750 MG 24 hr tablet Take 1 tablet (750 mg total) by mouth daily with breakfast. 90 tablet 3  . Multiple Vitamins-Minerals (MULTIVITAMIN WITH MINERALS) tablet Take 1 tablet by mouth daily.    . nitroGLYCERIN (NITROSTAT) 0.4 MG SL tablet Place 1 tablet (0.4 mg total) under the tongue every 5 (five) minutes as needed for chest pain. 25 tablet 2  . PAZEO 0.7 % SOLN INSTILL 1 DROP INTO INTO BOTH EYES EVERY MORNING AS NEEDED FOR ITCHING  4  . metoprolol succinate (TOPROL XL) 25 MG 24 hr tablet Take 1 tablet (25 mg total) by mouth daily. 90 tablet 3   No current facility-administered medications for this visit.     Social History   Socioeconomic History  . Marital status: Married    Spouse name: Not on file  . Number of children: Not on file  . Years of education: Not on file  . Highest education level: Not on file  Occupational History  . Not on file  Social Needs  .  Financial resource strain: Not on file  . Food insecurity:    Worry: Not on file    Inability: Not on file  . Transportation needs:    Medical: Not on file    Non-medical: Not on file  Tobacco Use  . Smoking status: Former Smoker    Packs/day: 1.00    Years: 10.00    Pack years: 10.00    Start date: 07/16/1977    Last attempt to quit: 07/17/1987    Years since quitting: 30.0  . Smokeless tobacco: Never Used  Substance and Sexual Activity  . Alcohol use: No  Alcohol/week: 0.0 oz  . Drug use: No  . Sexual activity: Not on file  Lifestyle  . Physical activity:    Days per week: Not on file    Minutes per session: Not on file  . Stress: Not on file  Relationships  . Social connections:    Talks on phone: Not on file    Gets together: Not on file    Attends religious service: Not on file    Active member of club or organization: Not on file    Attends meetings of clubs or organizations: Not on file    Relationship status: Not on file  . Intimate partner violence:    Fear of current or ex partner: Not on file    Emotionally abused: Not on file    Physically abused: Not on file    Forced sexual activity: Not on file  Other Topics Concern  . Not on file  Social History Narrative  . Not on file    Family History  Problem Relation Age of Onset  . Diabetes Mother   . Heart disease Father   . Diabetes Father   . Heart disease Paternal Grandfather   . Colon cancer Neg Hx   . Esophageal cancer Neg Hx   . Stomach cancer Neg Hx   . Rectal cancer Neg Hx    Social history is notable that he is married and has 2 children. There is no tobacco or alcohol use. He has an autistic son.   ROS General: Negative; No fevers, chills, or night sweats; there is purposeful weight loss HEENT: Negative; No changes in vision or hearing, sinus congestion, difficulty swallowing Pulmonary: Negative; No cough, wheezing, shortness of breath, hemoptysis Cardiovascular: See history of present  illness: No presyncope, syncope, palpitations GI: Negative; No nausea, vomiting, diarrhea, or abdominal pain GU: Negative; No dysuria, hematuria, or difficulty voiding Musculoskeletal: Left shoulder labrum tear Hematologic/Oncology: Negative; no easy bruising, bleeding Endocrine: Negative; no heat/cold intolerance; no diabetes Neuro: Negative; no changes in balance, headaches Skin: Negative; No rashes or skin lesions Psychiatric: Negative; No behavioral problems, depression Sleep: Negative; No snoring, daytime sleepiness, hypersomnolence, bruxism, restless legs, hypnogognic hallucinations, no cataplexy Other comprehensive 14 point system review is negative.   PE BP 126/70   Pulse 61   Ht '6\' 2"'$  (1.88 m)   Wt 219 lb (99.3 kg)   BMI 28.12 kg/m    Repeat blood pressure by me was 124/70  Wt Readings from Last 3 Encounters:  07/27/17 219 lb (99.3 kg)  06/08/17 223 lb 14.4 oz (101.6 kg)  04/27/17 217 lb (98.4 kg)   General: Alert, oriented, no distress.  Skin: normal turgor, no rashes, warm and dry HEENT: Normocephalic, atraumatic. Pupils equal round and reactive to light; sclera anicteric; extraocular muscles intact;  Nose without nasal septal hypertrophy Mouth/Parynx benign; Mallinpatti scale 2 Neck: No JVD, no carotid bruits; normal carotid upstroke Lungs: clear to ausculatation and percussion; no wheezing or rales Chest wall: without tenderness to palpitation Heart: PMI not displaced, RRR, s1 s2 normal, 1/6 systolic murmur, no diastolic murmur, no rubs, gallops, thrills, or heaves Abdomen: soft, nontender; no hepatosplenomehaly, BS+; abdominal aorta nontender and not dilated by palpation. Back: no CVA tenderness Pulses 2+ Musculoskeletal: full range of motion, normal strength, no joint deformities Extremities: no clubbing cyanosis or edema, Homan's sign negative  Neurologic: grossly nonfocal; Cranial nerves grossly wnl Psychologic: Normal mood and affect   ECG  (independently read by me): normal sinus rhythm at 61 bpm.  Normal intervals.  No ectopy.  No ST segment changes.  December 2018 ECG (independently read by me):  Sinus bradycardia at 55 bpm.  PR interal 168 ms.  QTc inteval 417 ms. No ST-T changes.  February 2018 ECG (independently read by me): Sinus bradycardia at 48 bpm.  No ST segment changes.  Normal intervals.  June 2017 ECG (independently read by me): Sinus bradycardia 58 bpm.  Normal intervals.  No ST segment changes.  September 2016 ECG (independently read by me): Sinus bradycardia 58 bpm.  Mild RV conduction delay.  September 2015 ECG:( Independently read by me): Sinus bradycardia 50 beats per minute.  No ST segment changes.  Normal intervals.   November 2014 ECG: Sinus bradycardia at 45 beats per minute. No significant ST-T change.  LABS: BMP Latest Ref Rng & Units 01/19/2017 06/30/2016 10/20/2015  Glucose 65 - 99 mg/dL 134(H) 115(H) 143(H)  BUN 6 - 20 mg/dL '15 14 15  '$ Creatinine 0.61 - 1.24 mg/dL 0.96 0.95 0.81  Sodium 135 - 145 mmol/L 138 139 137  Potassium 3.5 - 5.1 mmol/L 4.1 4.6 4.5  Chloride 101 - 111 mmol/L 107 104 105  CO2 22 - 32 mmol/L '25 25 22  '$ Calcium 8.9 - 10.3 mg/dL 9.1 9.5 9.4   Hepatic Function Latest Ref Rng & Units 06/30/2016 10/20/2015 02/05/2015  Total Protein 6.1 - 8.1 g/dL 7.2 7.1 7.2  Albumin 3.6 - 5.1 g/dL 4.4 4.5 4.7  AST 10 - 35 U/L '16 19 16  '$ ALT 9 - 46 U/L '13 17 14  '$ Alk Phosphatase 40 - 115 U/L 56 59 60  Total Bilirubin 0.2 - 1.2 mg/dL 1.2 1.0 1.0   CBC Latest Ref Rng & Units 01/19/2017 06/30/2016 10/20/2015  WBC 4.0 - 10.5 K/uL 7.2 9.2 4.8  Hemoglobin 13.0 - 17.0 g/dL 15.0 14.9 14.7  Hematocrit 39.0 - 52.0 % 42.3 43.3 42.1  Platelets 150 - 400 K/uL 213 208 201   Lab Results  Component Value Date   MCV 90.8 01/19/2017   MCV 92.3 06/30/2016   MCV 91.3 10/20/2015   Lab Results  Component Value Date   TSH 1.61 06/30/2016   Lab Results  Component Value Date   HGBA1C 5.9 03/21/2017   Lipid  Panel     Component Value Date/Time   CHOL 93 06/30/2016 1117   CHOL 99 04/02/2013 0903   TRIG 86 06/30/2016 1117   TRIG 59 04/02/2013 0903   HDL 32 (L) 06/30/2016 1117   HDL 42 04/02/2013 0903   CHOLHDL 2.9 06/30/2016 1117   VLDL 17 06/30/2016 1117   LDLCALC 44 06/30/2016 1117   LDLCALC 45 04/02/2013 0903     RADIOLOGY: No results found.  IMPRESSION:  1. Coronary artery disease involving native coronary artery of native heart without angina pectoris   2. Essential hypertension   3. Hyperlipidemia with target LDL less than 70   4. PAF (paroxysmal atrial fibrillation) ([place])   5. Medication management   6. Type 2 diabetes mellitus without complication, without long-term current use of insulin [facility])     ASSESSMENT AND PLAN: Mr. Prashant Glosser is a 58 year old white male who is 14 years status post stenting to his proximal to mid LAD in October 2005.  At his last catheterization in October 2007 his LAD was widely patent and he had mild 30% narrowing.  A nuclear perfusion study in January 2013 continued to show normal perfusion.  He has not had any recurrent anginal symptomatology. He apparently developed an episode  of atrial fibrillation with RVR leading to a 01/19/2017 ER evaluation.  He had new onset of symptomatology and was successfully cardioverted in the emergency room.  At that time his beta blocker regimen was increased and he was started on Xarelto and Plavix was discontinued.  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  Oftentimes he is they're working on the roof rather than just supervising.  He sustained head trauma, making him were very concerned about continued anticoagulation.  Since I last saw him, he has been back on aspirin, Plavix and Xarelto had been discontinued.  He has not had any arrhythmia. He denies bleeding.  His blood pressure is well controlled on metoprolol, tartrate, which is supposed to be 25 mg twice a day.  ,  However, he tells me  that at times he has been missing some of his evening dose.  For this reason, I will switch him to metoprolol succinate for long-acting preparation.  He has been bothered by allergies and often takes Allegra in addition to [facility].  His ECG remained stable.  He continues to be on Vytorin 10/44.  Hyperlipidemia.  Most recent LDL on [place] 22 2018 was excellent at 44.  BMI is 28.12.he is diabetic on metformin XR 750 mg daily  He will undergo repeat fasting laboratory. As long as he remains stable, I will see him in 6 months for reevaluation.    Time spent: 25 minutes Troy Sine, MD, [facility]  07/28/2017 7:46 AM

## 2017-07-27 NOTE — Telephone Encounter (Signed)
Returned call and dicussed with patient.  Patient aware and verbalized understanding.

## 2017-07-27 NOTE — Telephone Encounter (Signed)
New Message  Pt is calling to check if Michael Livingston knows of a good urologist. Please call

## 2017-07-27 NOTE — Patient Instructions (Addendum)
Medication Instructions:  STOP metoprolol tartrate  START metoprolol succinate (Toprol XL) 25 mg daily  Labwork: Please return for FASTING labs (CMET, CBC, Lipid, TSH)  Our in office lab hours are Monday-Friday 8:00-4:00, closed for lunch 12:45-1:45 pm.  No appointment needed.  Follow-Up: Your physician wants you to follow-up in: 6 months with Dr. Claiborne Billings.  You will receive a reminder letter in the mail two months in advance. If you don't receive a letter, please call our office to schedule the follow-up appointment.   Any Other Special Instructions Will Be Listed Below (If Applicable).     If you need a refill on your cardiac medications before your next appointment, please call your pharmacy.

## 2017-07-28 ENCOUNTER — Encounter: Payer: Self-pay | Admitting: Cardiovascular Disease

## 2017-08-01 LAB — COMPREHENSIVE METABOLIC PANEL
ALK PHOS: 60 IU/L (ref 39–117)
ALT: 16 IU/L (ref 0–44)
AST: 21 IU/L (ref 0–40)
Albumin/Globulin Ratio: 1.5 (ref 1.2–2.2)
Albumin: 4.5 g/dL (ref 3.5–5.5)
BILIRUBIN TOTAL: 0.7 mg/dL (ref 0.0–1.2)
BUN/Creatinine Ratio: 14 (ref 9–20)
BUN: 13 mg/dL (ref 6–24)
CHLORIDE: 101 mmol/L (ref 96–106)
CO2: 23 mmol/L (ref 20–29)
Calcium: 9.5 mg/dL (ref 8.7–10.2)
Creatinine, Ser: 0.92 mg/dL (ref 0.76–1.27)
GFR calc Af Amer: 109 mL/min/{1.73_m2} (ref >59)
GFR calc non Af Amer: 94 mL/min/{1.73_m2} (ref >59)
GLUCOSE: 127 mg/dL — AB (ref 65–99)
Globulin, Total: 3.1 g/dL (ref 1.5–4.5)
Potassium: 4.6 mmol/L (ref 3.5–5.2)
Sodium: 139 mmol/L (ref 134–144)
Total Protein: 7.6 g/dL (ref 6.0–8.5)

## 2017-08-01 LAB — LIPID PANEL
CHOLESTEROL TOTAL: 105 mg/dL (ref 100–199)
Chol/HDL Ratio: 2.8 ratio (ref 0.0–5.0)
HDL: 37 mg/dL — AB (ref >39)
LDL Calculated: 49 mg/dL (ref 0–99)
TRIGLYCERIDES: 94 mg/dL (ref 0–149)
VLDL CHOLESTEROL CAL: 19 mg/dL (ref 5–40)

## 2017-08-01 LAB — CBC
Hematocrit: 43.5 % (ref 37.5–51.0)
Hemoglobin: 15.2 g/dL (ref 13.0–17.7)
MCH: 31.8 pg (ref 26.6–33.0)
MCHC: 34.9 g/dL (ref 31.5–35.7)
MCV: 91 fL (ref 79–97)
PLATELETS: 218 10*3/uL (ref 150–379)
RBC: 4.78 x10E6/uL (ref 4.14–5.80)
RDW: 13 % (ref 12.3–15.4)
WBC: 5.1 10*3/uL (ref 3.4–10.8)

## 2017-08-01 LAB — TSH: TSH: 2.25 u[IU]/mL (ref 0.450–4.500)

## 2017-08-07 ENCOUNTER — Telehealth: Payer: Self-pay | Admitting: Cardiovascular Disease

## 2017-08-07 NOTE — Telephone Encounter (Signed)
Pt aware pharmacy has refill request but pt should call when is on way to pick up to make sure it is ready .Pt verbalized understanding /cy

## 2017-08-07 NOTE — Telephone Encounter (Signed)
New Message  Pt c/o medication issue:  1. Name of Medication: metoprolol succinate (TOPROL XL) 25 MG 24 hr tablet  2. How are you currently taking this medication (dosage and times per day)? Take 1 tablet (25 mg total) by mouth daily  3. Are you having a reaction (difficulty breathing--STAT)? no  4. What is your medication issue?  Pt is wanting to know if his new Metoprolol prescription was ever sent to [facility] drug store in [place] because he never received a call

## 2017-09-18 ENCOUNTER — Encounter: Payer: Self-pay | Admitting: *Deleted

## 2017-10-10 ENCOUNTER — Emergency Department (HOSPITAL_COMMUNITY)
Admission: EM | Admit: 2017-10-10 | Discharge: 2017-10-10 | Disposition: A | Discharge status: Home / Self Care | Payer: PRIVATE HEALTH INSURANCE | Attending: Emergency Medicine | Admitting: Emergency Medicine

## 2017-10-10 ENCOUNTER — Encounter (HOSPITAL_COMMUNITY): Payer: Self-pay | Admitting: Emergency Medicine

## 2017-10-10 ENCOUNTER — Encounter: Payer: Self-pay | Admitting: Family Medicine

## 2017-10-10 ENCOUNTER — Ambulatory Visit (INDEPENDENT_AMBULATORY_CARE_PROVIDER_SITE_OTHER): Payer: PRIVATE HEALTH INSURANCE | Admitting: Family Medicine

## 2017-10-10 VITALS — BP 154/90 | HR 173 | Ht 74.0 in | Wt 219.0 lb

## 2017-10-10 DIAGNOSIS — Z87891 Personal history of nicotine dependence: Secondary | ICD-10-CM | POA: Diagnosis not present

## 2017-10-10 DIAGNOSIS — E1122 Type 2 diabetes mellitus with diabetic chronic kidney disease: Secondary | ICD-10-CM | POA: Insufficient documentation

## 2017-10-10 DIAGNOSIS — I4891 Unspecified atrial fibrillation: Secondary | ICD-10-CM

## 2017-10-10 DIAGNOSIS — N189 Chronic kidney disease, unspecified: Secondary | ICD-10-CM | POA: Diagnosis not present

## 2017-10-10 DIAGNOSIS — I25119 Atherosclerotic heart disease of native coronary artery with unspecified angina pectoris: Secondary | ICD-10-CM | POA: Insufficient documentation

## 2017-10-10 DIAGNOSIS — R0602 Shortness of breath: Secondary | ICD-10-CM

## 2017-10-10 DIAGNOSIS — I480 Paroxysmal atrial fibrillation: Secondary | ICD-10-CM | POA: Diagnosis not present

## 2017-10-10 DIAGNOSIS — E119 Type 2 diabetes mellitus without complications: Secondary | ICD-10-CM | POA: Diagnosis not present

## 2017-10-10 DIAGNOSIS — I2510 Atherosclerotic heart disease of native coronary artery without angina pectoris: Secondary | ICD-10-CM | POA: Insufficient documentation

## 2017-10-10 DIAGNOSIS — I129 Hypertensive chronic kidney disease with stage 1 through stage 4 chronic kidney disease, or unspecified chronic kidney disease: Secondary | ICD-10-CM | POA: Diagnosis not present

## 2017-10-10 DIAGNOSIS — R002 Palpitations: Secondary | ICD-10-CM | POA: Diagnosis present

## 2017-10-10 LAB — CBC WITH DIFFERENTIAL/PLATELET
Abs Immature Granulocytes: 0 10*3/uL (ref 0.0–0.1)
Basophils Absolute: 0.1 10*3/uL (ref 0.0–0.1)
Basophils Relative: 1 %
EOS PCT: 3 %
Eosinophils Absolute: 0.3 10*3/uL (ref 0.0–0.7)
HEMATOCRIT: 43.5 % (ref 39.0–52.0)
Hemoglobin: 15.2 g/dL (ref 13.0–17.0)
Immature Granulocytes: 0 %
LYMPHS ABS: 2.2 10*3/uL (ref 0.7–4.0)
Lymphocytes Relative: 23 %
MCH: 31.1 pg (ref 26.0–34.0)
MCHC: 34.9 g/dL (ref 30.0–36.0)
MCV: 89 fL (ref 78.0–100.0)
MONOS PCT: 9 %
Monocytes Absolute: 0.9 10*3/uL (ref 0.1–1.0)
Neutro Abs: 6 10*3/uL (ref 1.7–7.7)
Neutrophils Relative %: 64 %
Platelets: 217 10*3/uL (ref 150–400)
RBC: 4.89 MIL/uL (ref 4.22–5.81)
RDW: 12.1 % (ref 11.5–15.5)
WBC: 9.5 10*3/uL (ref 4.0–10.5)

## 2017-10-10 LAB — COMPREHENSIVE METABOLIC PANEL
ALBUMIN: 4.2 g/dL (ref 3.5–5.0)
ALT: 18 U/L (ref 17–63)
ANION GAP: 10 (ref 5–15)
AST: 25 U/L (ref 15–41)
Alkaline Phosphatase: 53 U/L (ref 38–126)
BILIRUBIN TOTAL: 1 mg/dL (ref 0.3–1.2)
BUN: 15 mg/dL (ref 6–20)
CHLORIDE: 108 mmol/L (ref 101–111)
CO2: 25 mmol/L (ref 22–32)
CREATININE: 1.05 mg/dL (ref 0.61–1.24)
Calcium: 9.3 mg/dL (ref 8.9–10.3)
GFR calc non Af Amer: >60 mL/min (ref >60)
Glucose, Bld: 133 mg/dL — ABNORMAL HIGH (ref 65–99)
Potassium: 4 mmol/L (ref 3.5–5.1)
Sodium: 143 mmol/L (ref 135–145)
Total Protein: 7.5 g/dL (ref 6.5–8.1)

## 2017-10-10 LAB — MAGNESIUM: Magnesium: 2.2 mg/dL (ref 1.7–2.4)

## 2017-10-10 LAB — I-STAT TROPONIN, ED: TROPONIN I, POC: 0.02 ng/mL (ref 0.00–0.08)

## 2017-10-10 LAB — PROTIME-INR
INR: 0.98
Prothrombin Time: 12.9 seconds (ref 11.4–15.2)

## 2017-10-10 MED ORDER — RIVAROXABAN 20 MG PO TABS
20.0000 mg | ORAL_TABLET | Freq: Once | ORAL | Status: AC
  Administered 2017-10-10: 20 mg via ORAL
  Filled 2017-10-10: qty 1

## 2017-10-10 MED ORDER — DILTIAZEM HCL 30 MG PO TABS: 30.0000 mg | ORAL_TABLET | Freq: Every day | ORAL | 1 refills | Status: DC | PRN

## 2017-10-10 MED ORDER — ETOMIDATE 2 MG/ML IV SOLN
10.0000 mg | Freq: Once | INTRAVENOUS | Status: AC
  Administered 2017-10-10: 10 mg via INTRAVENOUS
  Filled 2017-10-10: qty 10

## 2017-10-10 MED ORDER — METOPROLOL SUCCINATE ER 25 MG PO TB24: 37.5000 mg | ORAL_TABLET | Freq: Every day | ORAL | 2 refills | Status: DC

## 2017-10-10 MED ORDER — RIVAROXABAN 20 MG PO TABS
20.0000 mg | ORAL_TABLET | Freq: Once | ORAL | Status: DC
  Filled 2017-10-10: qty 1

## 2017-10-10 MED ORDER — SODIUM CHLORIDE 0.9 % IV BOLUS
500.0000 mL | Freq: Once | INTRAVENOUS | Status: AC
  Administered 2017-10-10: 500 mL via INTRAVENOUS

## 2017-10-10 MED ORDER — RIVAROXABAN 20 MG PO TABS: 20.0000 mg | ORAL_TABLET | Freq: Every day | ORAL | 2 refills | Status: DC

## 2017-10-10 NOTE — Consult Note (Addendum)
Cardiology Consult    Patient ID: Michael Livingston MRN: [id number], DOB/AGE: 1961/02/08   Admit date: 10/10/2017 Date of Consult: 10/10/2017  Primary Physician: Eulas Post, MD Primary Cardiologist: Dr. Claiborne Billings Requesting Provider: Dr. Laverta Baltimore Reason for Consultation: Afib RVR  Rand Etchison is a 57 y.o. male who is being seen today for the evaluation of Afib RVR at the request of Dr. Laverta Baltimore.   Patient Profile    57 yo male with PMH of CAD prior stenting, DM, PAF who presented with AFib RVR from PCP office to the ED.   Past Medical History   Past Medical History:  Diagnosis Date  . Allergy   . Atrial fibrillation ([place])    a. diagnosed in 01/2017 --> s/p DCCV. Started on Xarelto for anticoagulation.   [place] CAD (coronary artery disease)    a. s/p DES to LAD in 2005 b. patent by cath in 2007 with 30% LCx stenosis noted at that time c. normal NST in 05/2011  . Chronic kidney disease    1 kidney stone  . Diabetes mellitus without complication (HCC)    type 2, no meds needed  . Hyperlipidemia   . Hypertension   . Myocardial infarction [facility])     Past Surgical History:  Procedure Laterality Date  . CARDIAC SURGERY  2005   stent  . CORONARY STENT PLACEMENT    . KNEE ARTHROSCOPY     left 2003, right 2005  . SHOULDER SURGERY       Allergies  Allergies  Allergen Reactions  . Aleve [Naproxen Sodium] Swelling    Mouth swells  . Oxycodone Other (See Comments)    Rendered the patient unable to fall asleep AND he "almost passed out"  . Sulfa Antibiotics Hives  . Vicodin [Hydrocodone-Acetaminophen] Other (See Comments)    Rendered the patient unable to fall asleep AND he "almost passed out"    History of Present Illness    Mr. Kehoe is a 57 yo male with PMH of  CAD prior stenting, DM, PAF who presented with AFib RVR. He was initially Dx of PAF back in 9/18 and underwent successful DCCV during that time. He was placed on metoprolol and Xarelto with plavix stopped. He eventually  reduced his metoprolol to once a day dosing and therefore was switched to Toprol instead. He was seen back in 3/19 and decision made to stop his Xarelto as he had hit his head while on the job and increased concern for bleeding. He had not had a recurrence of Afib since initial presentation. He was placed back on ASA and plavix.   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 States he had become hot, increased caffeine and working hard today. When symptoms developed he presented to his PCP office and noted to be in Afib RVR. Transferred to [facility] for further management.   In the ED he was noted to be in Afib RVR with rates in the 160-170s. No chest pain or shortness of breath. Given his onset was about 2 hours prior, cardiology consulted to see. Labs were stable. Decision made to cardiovert at the bedside.   Inpatient Medications    . rivaroxaban  20 mg Oral Once    Family History    Family History  Problem Relation Age of Onset  . Diabetes Mother   . Heart disease Father   . Diabetes Father   . Heart disease Paternal Grandfather   . Colon cancer  Neg Hx   . Esophageal cancer Neg Hx   . Stomach cancer Neg Hx   . Rectal cancer Neg Hx     Social History    Social History   Socioeconomic History  . Marital status: Married    Spouse name: Not on file  . Number of children: Not on file  . Years of education: Not on file  . Highest education level: Not on file  Occupational History  . Not on file  Social Needs  . Financial resource strain: Not on file  . Food insecurity:    Worry: Not on file    Inability: Not on file  . Transportation needs:    Medical: Not on file    Non-medical: Not on file  Tobacco Use  . Smoking status: Former Smoker    Packs/day: 1.00    Years: 10.00    Pack years: 10.00    Start date: 07/16/1977    Last attempt to quit: 07/17/1987    Years since quitting: 30.2  . Smokeless tobacco: Never Used    Substance and Sexual Activity  . Alcohol use: No    Alcohol/week: 0.0 oz  . Drug use: No  . Sexual activity: Not on file  Lifestyle  . Physical activity:    Days per week: Not on file    Minutes per session: Not on file  . Stress: Not on file  Relationships  . Social connections:    Talks on phone: Not on file    Gets together: Not on file    Attends religious service: Not on file    Active member of club or organization: Not on file    Attends meetings of clubs or organizations: Not on file    Relationship status: Not on file  . Intimate partner violence:    Fear of current or ex partner: Not on file    Emotionally abused: Not on file    Physically abused: Not on file    Forced sexual activity: Not on file  Other Topics Concern  . Not on file  Social History Narrative  . Not on file     Review of Systems    General:  No chills, fever, night sweats or weight changes.  Cardiovascular:  No chest pain, dyspnea on exertion, edema, orthopnea, palpitations, paroxysmal nocturnal dyspnea. Dermatological: No rash, lesions/masses Respiratory: No cough, dyspnea Urologic: No hematuria, dysuria Abdominal:   No nausea, vomiting, diarrhea, bright red blood per rectum, melena, or hematemesis Neurologic:  No visual changes, wkns, changes in mental status. All other systems reviewed and are otherwise negative except as noted above.  Physical Exam    Blood pressure (!) 136/92, pulse 60, temperature 98.3 F (36.8 C), temperature source Oral, resp. rate 15, height 6\' 2"  (1.88 m), weight 215 lb (97.5 kg), SpO2 100 %.  General: Pleasant, NAD Psych: Normal affect. Neuro: Alert and oriented X 3. Moves all extremities spontaneously. HEENT: Normal  Neck: Supple without bruits or JVD. Lungs:  Resp regular and unlabored, CTA. Heart: irregular irregular tachycardic rhythm with ventricular rate at 150-160 bpm consistent with atrial fibrillation.. Abdomen: Soft, non-tender, non-distended, BS + x  4.  Extremities: No clubbing, cyanosis or edema. DP/PT/Radials 2+ and equal bilaterally.  Labs    Troponin (Point of Care Test) No results for input(s): TROPIPOC in the last 72 hours. No results for input(s): CKTOTAL, CKMB, TROPONINI in the last 72 hours. Lab Results  Component Value Date   WBC 5.1 08/01/2017  HGB 15.2 08/01/2017   HCT 43.5 08/01/2017   MCV 91 08/01/2017   PLT 218 08/01/2017   No results for input(s): NA, K, CL, CO2, BUN, CREATININE, CALCIUM, PROT, BILITOT, ALKPHOS, ALT, AST, GLUCOSE in the last 168 hours.  Invalid input(s): LABALBU Lab Results  Component Value Date   CHOL 105 08/01/2017   HDL 37 (L) 08/01/2017   LDLCALC 49 08/01/2017   TRIG 94 08/01/2017   No results found for: [facility]   Radiology Studies    No results found.  ECG & Cardiac Imaging    EKG:  The EKG was personally reviewed and demonstrates Afib RVR  Echo: 7/18  Study Conclusions  - Left ventricle: The cavity size was normal. Wall thickness was   increased in a pattern of mild LVH. Systolic function was normal.   The estimated ejection fraction was in the range of 60% to 65%.   Wall motion was normal; there were no regional wall motion   abnormalities. Left ventricular diastolic function parameters   were normal. - Mitral valve: Mildly thickened leaflets . There was trivial   regurgitation. - Left atrium: The atrium was normal in size. - Right atrium: The atrium was mildly dilated. - Inferior vena cava: The vessel was normal in size. The   respirophasic diameter changes were in the normal range (>= 50%),   consistent with normal central venous pressure.  Impressions:  - LVEF 60-65%, mild LVH, normal wall motion, normal diastolic   function, trivial MR, normal LA size, mild RAE, normal IVC.  Assessment & Plan    57 yo male with PMH of CAD prior stenting, DM, PAF who presented with AFib RVR from PCP office to the ED.  Afib RVR: he felt the Afib when it started and was  symptomatic. Previously had been on Xarelto. DCCV done in the ED with conversion to SR. Will increase his Toprol to 37.5mg  daily. Add Dilt 30mg  PRN daily for palpitations. Restart Xarelto 20mg  daily. Discussed the need to avoid caffeine and dehydration.  -- will arrange for follow up in the office.   CAD with prior stenting: No chest pain. Will d/c plavix and ASA given the need for [place] at this time.   Barnet Pall, NP-C Pager [phone] 10/10/2017, 6:54 PM   Patient seen and examined. Agree with assessment and plan. Mr. Nils Thor is a 57 year old gentleman who is well-known to me.  Has a history of prior CAD underwent stenting to his proximal to mid LAD 2005.  He has a history of recent diabetes mellitus.  In September 2018 he developed new onset atrial fibrillation and underwent successful cardioversion with restoration of sinus rhythm.  His metoprolol dose was increased to 50 mg twice a day and he was started on Xarelto 20 mg daily.  At that time his Plavix was discontinued.  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  Since he had not had any recurrent episodes of AF, decision was made to change him back to aspirin Plavix and Xarelto was discontinued.  He was also changed to long-acting metoprolol since he had self reduced his dose and was only taking 25 mg of short acting metoprolol at bedtime.  He had done well without recurrent symptomatology until later this afternoon when while on a roof he again started to abruptly noticed his pulse  become irregular and noted some dizziness.  He quickly got off the roof.  He went to Dr. Timmothy Sours Moore's office where his ECG revealed atrial fibrillation and was referred to the [facility] emergency room.  I was asked to see him by [facility] for potential for ER cardioversion.  His atrial fibrillation was only of  several hours duration.  He was not having any chest pain or shortness of breath.  I have recommended pursuing cardioversion this evening due to the very recent onset of his AF. Successful DC cardioversion was performed by the ER physician with restoration of sinus rhythm after 1 shock at 100 J of synchronized cardioversion. Telemetry following cardioversion shows sinus rhythm with ventricular rate in the 80s.  Patient was given a dose of Xarelto 20 mg and the plan is to continue Xarelto 20 mg daily.  He will discontinue aspirin and Plavix.  I have recommended further increasing metoprolol XL to 37.5 mg daily and he will also be given a prescription for Cardizem short acting 30 mg to take 1 to 2 pills as needed if he notices brief recurrence.  I will arrange a follow-up appointment to see him in the office in 2 weeks or sooner if recurrent problems arise.   Troy Sine, MD, [facility] 10/10/2017 7:07 PM

## 2017-10-10 NOTE — Patient Instructions (Addendum)
This patient has a history of MI with stents and was on the roof today and after glass of tea with caffeine about 40 minutes ago experience lightheadedness and shortness of breath with no chest pain drove immediately to our office and an EKG was performed that showed atrial fibrillation with rapid ventricular response with a rate of about 180.  By the time we did another EKG the rate was in the 140s and appeared to be more tachycardic.  I did speak with Dr. Skeet Latch about this patient and she understands that he is being transported by EMS to the [facility] ER.  He is typically followed by Dr. Claiborne Billings.

## 2017-10-10 NOTE — ED Provider Notes (Signed)
Emergency Department Provider Note   I have reviewed the triage vital signs and the nursing notes.   HISTORY  Chief Complaint No chief complaint on file.   HPI Michael Livingston is a 57 y.o. male with PMH of PAF on Plavix only, CKD, DM, HLD, CAD, and HTN since to the emergency department for evaluation of acute onset heart palpitations.  The patient was outside working in the heat today when symptoms began abruptly at 4 PM.  He was trying to stay hydrated.  He states he has had this in the past with A. fib episodes.  He reports that in September of last year he had to be cardioverted with a similar episode.  When symptoms started he recognized this and immediately presented to his doctor's office.  Patient was found to be in A. fib RVR there and transported by EMS to the emergency department.  Patient denies any chest pain or lightheadedness.  No recent fevers.  He is been compliant with his medications.  Patient states he initially was started on Xarelto after his last cardioversion but is working with Dr. Claiborne Billings who felt comfortable discontinuing the Xarelto and starting the patient on Plavix.    Past Medical History:  Diagnosis Date  . Allergy   . Atrial fibrillation ([place])    a. diagnosed in 01/2017 --> s/p DCCV. Started on Xarelto for anticoagulation.   Marland Kitchen CAD (coronary artery disease)    a. s/p DES to LAD in 2005 b. patent by cath in 2007 with 30% LCx stenosis noted at that time c. normal NST in 05/2011  . Chronic kidney disease    1 kidney stone  . Diabetes mellitus without complication (HCC)    type 2, no meds needed  . Hyperlipidemia   . Hypertension   . Myocardial infarction [facility])     Patient Active Problem List   Diagnosis Date Noted  . Coronary artery disease involving native coronary artery of native heart without angina pectoris   . Atrial fibrillation with RVR ([place]) 01/30/2017  . Preoperative clearance 01/24/2015  . Metabolic syndrome 08/65/7846  . Hyperlipidemia  with target LDL less than 70 04/02/2013  . Type 2 diabetes mellitus without complication, without [place]-term current use of insulin ([place]) 07/16/2012  . Hyperlipidemia, mixed 07/16/2012  . Coronary atherosclerosis 02/06/2009  . ABDOMINAL PAIN, UPPER 02/06/2009    Past Surgical History:  Procedure Laterality Date  . CARDIAC SURGERY  2005   stent  . CORONARY STENT PLACEMENT    . KNEE ARTHROSCOPY     left 2003, right 2005  . SHOULDER SURGERY      Current Outpatient Rx  . Order #: [id number] Class: Historical Med  . Order #: [id number] Class: Normal  . Order #: [id number] Class: Normal  . Order #: [id number] Class: Normal  . Order #: [id number] Class: Historical Med  . Order #: [id number] Class: Normal  . Order #: [id number] Class: Historical Med  . Order #: [id number] Class: Normal  . Order #: [id number] Class: Normal  . Order #: [id number] Class: Normal    Allergies Aleve [naproxen sodium]; Oxycodone; Sulfa antibiotics; and Vicodin [hydrocodone-acetaminophen]  Family History  Problem Relation Age of Onset  . Diabetes Mother   . Heart disease Father   . Diabetes Father   . Heart disease Paternal Grandfather   . Colon cancer Neg Hx   . Esophageal cancer Neg Hx   . Stomach cancer Neg Hx   . Rectal cancer Neg Hx     Social History Social History   Tobacco Use  .  Smoking status: Former Smoker    Packs/day: 1.00    Years: 10.00    Pack years: 10.00    Start date: 07/16/1977    Last attempt to quit: 07/17/1987    Years since quitting: 30.2  . Smokeless tobacco: Never Used  Substance Use Topics  . Alcohol use: No    Alcohol/week: 0.0 oz  . Drug use: No    Review of Systems  Constitutional: No fever/chills Eyes: No visual changes. ENT: No sore throat. Cardiovascular: Denies chest pain. Positive palpitations.  Respiratory: Denies shortness of breath. Gastrointestinal: No abdominal pain.  No nausea, no vomiting.  No diarrhea.  No constipation. Genitourinary: Negative for  dysuria. Musculoskeletal: Negative for back pain. Skin: Negative for rash. Neurological: Negative for headaches, focal weakness or numbness.  10-point ROS otherwise negative.  ____________________________________________   PHYSICAL EXAM:  VITAL SIGNS: ED Triage Vitals  Enc Vitals Group     BP 10/10/17 1746 129/84     Pulse Rate 10/10/17 1746 (!) 154     Resp 10/10/17 1746 18     Temp 10/10/17 1749 98.3 F (36.8 C)     Temp Source 10/10/17 1749 Oral     SpO2 10/10/17 1746 100 %     Weight 10/10/17 1749 215 lb (97.5 kg)     Height 10/10/17 1749 6\' 2"  (1.88 m)     Pain Score 10/10/17 1749 0   Constitutional: Alert and oriented. Well appearing and in no acute distress. Eyes: Conjunctivae are normal. Head: Atraumatic. Nose: No congestion/rhinnorhea. Mouth/Throat: Mucous membranes are moist.  Neck: No stridor.   Cardiovascular: A-fib with RVR. Good peripheral circulation. Grossly normal heart sounds.   Respiratory: Normal respiratory effort.  No retractions. Lungs CTAB. Gastrointestinal: Soft and nontender. No distention.  Musculoskeletal: No lower extremity tenderness nor edema. No gross deformities of extremities. Neurologic:  Normal speech and language. No gross focal neurologic deficits are appreciated.  Skin:  Skin is warm, dry and intact. No rash noted. ____________________________________________   LABS (all labs ordered are listed, but only abnormal results are displayed)  Labs Reviewed  COMPREHENSIVE METABOLIC PANEL - Abnormal; Notable for the following components:      Result Value   Glucose, Bld 133 (*)    All other components within normal limits  CBC WITH DIFFERENTIAL/PLATELET  PROTIME-INR  MAGNESIUM  I-STAT TROPONIN, ED   ____________________________________________  EKG   EKG Interpretation  Date/Time:  Tuesday October 10 2017 17:52:29 EDT Ventricular Rate:  152 PR Interval:    QRS Duration: 99 QT Interval:  302 QTC Calculation: 481 R  Axis:   -20 Text Interpretation:  A fib with RVR Borderline left axis deviation RSR' in V1 or V2, right VCD or RVH Borderline prolonged QT interval No STEMI.  Confirmed by Nanda Quinton [phone]) on 10/10/2017 6:08:08 PM Also confirmed by Nanda Quinton [phone]), editor Philomena Doheny [phone])  on 10/11/2017 7:58:22 AM       ____________________________________________  RADIOLOGY  None ____________________________________________   PROCEDURES  Procedure(s) performed:   .Critical Care Performed by: Margette Fast, MD Authorized by: Margette Fast, MD   Critical care provider statement:    Critical care time (minutes):  35   Critical care time was exclusive of:  Separately billable procedures and treating other patients and teaching time   Critical care was necessary to treat or prevent imminent or life-threatening deterioration of the following conditions:  Cardiac failure and circulatory failure   Critical care was time spent personally by me on  the following activities:  Blood draw for specimens, development of treatment plan with patient or surrogate, discussions with consultants, evaluation of patient's response to treatment, examination of patient, obtaining history from patient or surrogate, ordering and performing treatments and interventions, ordering and review of laboratory studies, ordering and review of radiographic studies, pulse oximetry, re-evaluation of patient's condition and review of old charts   I assumed direction of critical care for this patient from another provider in my specialty: no   .Sedation Date/Time: 10/10/2017 7:04 PM Performed by: Margette Fast, MD Authorized by: Margette Fast, MD   Consent:    Consent obtained:  Written   Consent given by:  Patient   Risks discussed:  Allergic reaction, dysrhythmia, inadequate sedation, nausea, prolonged hypoxia resulting in organ damage, prolonged sedation necessitating reversal, respiratory compromise necessitating  ventilatory assistance and intubation and vomiting   Alternatives discussed:  Analgesia without sedation and anxiolysis Universal protocol:    Immediately prior to procedure a time out was called: yes   Indications:    Procedure performed:  Cardioversion   Procedure necessitating sedation performed by:  Physician performing sedation   Intended level of sedation:  Moderate (conscious sedation) Pre-sedation assessment:    Time since last food or drink:  6 hours   ASA classification: class 1 - normal, healthy patient     Mallampati score:  I - soft palate, uvula, fauces, pillars visible   Pre-sedation assessments completed and reviewed: airway patency, cardiovascular function, hydration status, mental status, nausea/vomiting, pain level and respiratory function   Immediate pre-procedure details:    Reassessment: Patient reassessed immediately prior to procedure     Reviewed: vital signs, relevant labs/tests and NPO status     Verified: bag valve mask available, emergency equipment available, intubation equipment available, IV patency confirmed, oxygen available and suction available   Procedure details (see MAR for exact dosages):    Preoxygenation:  Nasal cannula   Sedation:  Etomidate   Intra-procedure monitoring:  Blood pressure monitoring, cardiac monitor, continuous pulse oximetry, continuous capnometry, frequent LOC assessments and frequent vital sign checks   Intra-procedure events: none     Total Provider sedation time (minutes):  15 Post-procedure details:    Attendance: Constant attendance by certified staff until patient recovered     Recovery: Patient returned to pre-procedure baseline     Post-sedation assessments completed and reviewed: airway patency, cardiovascular function, hydration status, mental status, nausea/vomiting, pain level and respiratory function     Patient is stable for discharge or admission: yes     Patient tolerance:  Tolerated well, no immediate  complications .Cardioversion Date/Time: 10/10/2017 7:06 PM Performed by: Margette Fast, MD Authorized by: Margette Fast, MD   Consent:    Consent obtained:  Written   Consent given by:  Patient   Risks discussed:  Cutaneous burn, death, pain and induced arrhythmia   Alternatives discussed:  Rate-control medication Pre-procedure details:    Cardioversion basis:  Emergent   Rhythm:  Atrial fibrillation   Electrode placement:  Anterior-posterior Patient sedated: Yes. Refer to sedation procedure documentation for details of sedation.  Attempt one:    Cardioversion mode:  Synchronous   Shock (Joules):  100   Shock outcome:  Conversion to normal sinus rhythm Post-procedure details:    Patient status:  Awake   Patient tolerance of procedure:  Tolerated well, no immediate complications     ____________________________________________   INITIAL IMPRESSION / ASSESSMENT AND PLAN / ED COURSE  Pertinent labs & imaging  results that were available during my care of the patient were reviewed by me and considered in my medical decision making (see chart for details).  Patient presents to the emergency department for evaluation of acute onset heart palpitations.  On arrival he shows A. fib with RVR on EKG.  Blood pressure is normal.  Patient is awake and alert.  He is providing a good history with sudden onset at 4 PM.  Patient is not anticoagulated.  He is taking Plavix only after discussing with his cardiologist Dr. Claiborne Billings.  I discussed possible cardioversion with the patient.  I feel he would be a good candidate.  He has no concern that he has been going in and out of A. fib and states he can always feel once it is started.  We discussed the risk of stroke given the fact he is not anticoagulated.  Also discussed the other risks and benefits of sedation and the cardioversion procedure itself.  Plan to discuss with the cardiology service on call prior to procedure.   Spoke with the Cardiology  team. Hold on cardioversion for now. They prefer to evaluate the patient in the ED.   07:04 PM Dr. Claiborne Billings evaluated the patient at bedside and agrees with plan for Xarelto and Cardioversion. Cardioversion completed without complication as above. Patient now back in sinus rhythm. Appreciate Cardiology assistance with case. They will call in additional meds for the patient and schedule a follow up appointment.  Labs reviewed and patient observed in the ED and remains in sinus rhythm. First dose of Xarelto given here and patient meds called in by Cards. Plan for Cardiology follow up.   This patients CHA2DS2-VASc Score and unadjusted Ischemic Stroke Rate (% per year) is equal to 3.2% with a score of 3.    ____________________________________________  FINAL CLINICAL IMPRESSION(S) / ED DIAGNOSES  Final diagnoses:  Atrial fibrillation with RVR ([place])     MEDICATIONS GIVEN DURING THIS VISIT:  Medications  sodium chloride 0.9 % bolus 500 mL (0 mLs Intravenous Stopped 10/10/17 2038)  etomidate (AMIDATE) injection 10 mg (10 mg Intravenous Given 10/10/17 1852)  rivaroxaban (XARELTO) tablet 20 mg (20 mg Oral Given 10/10/17 1904)     NEW OUTPATIENT MEDICATIONS STARTED DURING THIS VISIT:  Discharge Medication List as of 10/10/2017  8:36 PM    START taking these medications   Details  diltiazem (CARDIZEM) 30 MG tablet Take 1 tablet (30 mg total) by mouth daily as needed., Starting Tue 10/10/2017, Until Wed 10/10/2018, Normal    rivaroxaban (XARELTO) 20 MG TABS tablet Take 1 tablet (20 mg total) by mouth daily with supper., Starting Tue 10/10/2017, Normal        Note:  This document was prepared using Dragon voice recognition software and may include unintentional dictation errors.  Nanda Quinton, MD Emergency Medicine    Trendon Zaring, [facility], MD 10/11/17 [phone]

## 2017-10-10 NOTE — ED Notes (Signed)
Consent signed by pt after procedure explained by Dr. Laverta Baltimore  Consent for cardioversion and conscious sedation

## 2017-10-10 NOTE — Progress Notes (Signed)
12 kewad

## 2017-10-10 NOTE — Progress Notes (Signed)
Subjective:    Patient ID: Michael Livingston, male    DOB: 1960-07-02, 57 y.o.   MRN: [id number]  HPI Patient here today for episode of A fib that started about 40 mins before arrival.  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  He has a history of having had an MI at 30 he is 57 years old has had a couple stents inserted.  He also earlier this year had a bout with atrial fib.  He is followed regularly by Dr. Claiborne Billings.  He denies any chest pain.  He is only having shortness of breath and lightheadedness.  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  Patient is currently on Plavix.  Also taking a baby aspirin once daily.  He is taking Vytorin metformin metoprolol 25 and has nitroglycerin.  He is also taking metformin for type 2 diabetes mellitus.   Patient Active Problem List   Diagnosis Date Noted  . New onset atrial fibrillation ([place]) 01/30/2017  . Preoperative clearance 01/24/2015  . Metabolic syndrome 88/41/6606  . Hyperlipidemia with target LDL less than 70 04/02/2013  . Type 2 diabetes, diet controlled ([place]) 07/16/2012  . Hyperlipidemia, mixed 07/16/2012  . Coronary atherosclerosis 02/06/2009  . ABDOMINAL PAIN, UPPER 02/06/2009   Outpatient Encounter Medications as of 10/10/2017  Medication Sig  . aspirin EC 81 MG tablet Take 1 tablet (81 mg total) by mouth daily.  . clopidogrel (PLAVIX) 75 MG tablet Take 75 mg by mouth daily.  Marland Kitchen ezetimibe-simvastatin (VYTORIN) 10-40 MG tablet Take 1 tablet by mouth daily.  . fluticasone (FLONASE) 50 MCG/ACT nasal spray Place 1 spray into both nostrils daily.  . metFORMIN (GLUCOPHAGE XR) 750 MG 24 hr tablet Take 1 tablet (750 mg total) by mouth daily with breakfast.  . metoprolol succinate (TOPROL XL) 25 MG 24 hr tablet Take 1 tablet (25 mg total) by mouth daily.  . Multiple Vitamins-Minerals  (MULTIVITAMIN WITH MINERALS) tablet Take 1 tablet by mouth daily.  . nitroGLYCERIN (NITROSTAT) 0.4 MG SL tablet Place 1 tablet (0.4 mg total) under the tongue every 5 (five) minutes as needed for chest pain.  Marland Kitchen PAZEO 0.7 % SOLN INSTILL 1 DROP INTO INTO BOTH EYES EVERY MORNING AS NEEDED FOR ITCHING   No facility-administered encounter medications on file as of 10/10/2017.       Review of Systems  Constitutional: Negative.   HENT: Negative.   Eyes: Negative.   Respiratory: Positive for shortness of breath.   Cardiovascular: Negative.   Gastrointestinal: Negative.   Endocrine: Negative.   Genitourinary: Negative.   Musculoskeletal: Negative.   Skin: Negative.   Allergic/Immunologic: Negative.   Neurological: Positive for light-headedness.  Hematological: Negative.   Psychiatric/Behavioral: Negative.        Objective:   Physical Exam  Constitutional: He is oriented to person, place, and time. He appears well-developed and well-nourished. He appears distressed.  Patient is somewhat sweaty but alert and relates the history well  HENT:  Head: Normocephalic and atraumatic.  Eyes: Pupils are equal, round, and reactive to light. Conjunctivae and EOM are normal. Right eye exhibits no discharge. Left eye exhibits no discharge.  Neck: Normal range of motion. Neck supple.  Cardiovascular: Normal rate and normal heart sounds.  No murmur heard. The rhythm is irregular irregular at about 150 - 170/min  Pulmonary/Chest: Breath  sounds normal. He has no wheezes. He has no rales.  Clear anteriorly and posteriorly  Abdominal: Soft. Bowel sounds are normal. He exhibits no mass. There is no tenderness.  Musculoskeletal: Normal range of motion. He exhibits no edema.  Neurological: He is alert and oriented to person, place, and time. He has normal reflexes.  Skin: Skin is warm and dry. No rash noted.  Psychiatric: He has a normal mood and affect. His behavior is normal. Judgment and thought content  normal.  Patient's mood and affect are appropriate for his current situation.  He is alert and relating his history well.  Nursing note and vitals reviewed.  BP (!) 154/90   Pulse (!) 173   Ht 6\' 2"  (1.88 m)   Wt 219 lb (99.3 kg)   BMI 28.12 kg/m   EMS was called and patient was transported to [facility] because of previous treatment and evaluation there.  Oxygen was started and patient was reassessed frequently until EMS arrived.     Assessment & Plan:  1. New onset atrial fibrillation [facility]) -Patient has had atrial fibrillation with the last ER visit being in September 2018.  2. SOB (shortness of breath) -2 at 3 L/min was started - EKG 12-Lead  3. Atrial fibrillation with rapid ventricular response (HCC) -EKG was done and a second EKG looking more like supraventricular tachycardia with nonspecific ST depression.  EMS was called and patient was transported to [facility] after speaking to Dr. Skeet Latch.  She is aware that this is a patient of Dr. Ellouise Newer.   Patient Instructions  This patient has a history of MI with stents and was on the roof today and after glass of tea with caffeine about 40 minutes ago experience lightheadedness and shortness of breath with no chest pain drove immediately to our office and an EKG was performed that showed atrial fibrillation with rapid ventricular response with a rate of about 180.  By the time we did another EKG the rate was in the 140s and appeared to be more tachycardic.  I did speak with Dr. Skeet Latch about this patient and she understands that he is being transported by EMS to the [facility] ER.  He is typically followed by Dr. Claiborne Billings.    Arrie Senate MD

## 2017-10-10 NOTE — Discharge Instructions (Signed)
You were seen in the ED today with a-fib. We shocked your heart back into a normal rhythm. Dr. Claiborne Billings has called in your new dose of metoprolol and additional meds, Diltiazem and Xarelto. You had your dose of Xarelto today. Do not take Aspirin and Plavix with Xarelto. Return to the ED immediately with any new symptoms not controlled by home meds or if you develop chest pain, difficulty breathing, or lightheadedness.

## 2017-10-10 NOTE — ED Notes (Signed)
Pt pocket knife labeled and placed with security. Security placed knife in box.

## 2017-10-10 NOTE — ED Triage Notes (Signed)
Per EMS, pt from doctor's office. 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 Pt has hx of an episode of afib which was resolved by cardioversion. Pt started taking xarelto for it originally, pt has since stopped d/t his job. Pt now takes Plavix. EMS gave one NS bolus. Pt A&Ox4, ambulatory. EMS VS BP 140/80, HR 130s. Hx of stents and MI.

## 2017-10-11 ENCOUNTER — Telehealth: Payer: Self-pay | Admitting: Cardiology

## 2017-10-11 ENCOUNTER — Telehealth: Payer: Self-pay | Admitting: Cardiovascular Disease

## 2017-10-11 ENCOUNTER — Telehealth (HOSPITAL_COMMUNITY): Payer: Self-pay | Admitting: *Deleted

## 2017-10-11 NOTE — Telephone Encounter (Signed)
New Message    Patient is calling because he was advised by Dr. [place]

## 2017-10-11 NOTE — Telephone Encounter (Signed)
Spoke to patient, scheduled for Wed 6/12 at 12 pm with Dr. Claiborne Billings

## 2017-10-11 NOTE — Telephone Encounter (Signed)
New Message   Patient is calling in reference to a conversation that he had with Dr. Claiborne Billings last night at ER. He states that Dr. Claiborne Billings told him that he will be in the office one day this week and to speak with Hayley. Please  Call.

## 2017-10-11 NOTE — Telephone Encounter (Signed)
LMOM for pt to clbk to sched follow up.  Pt dccv in ED

## 2017-10-18 ENCOUNTER — Ambulatory Visit (INDEPENDENT_AMBULATORY_CARE_PROVIDER_SITE_OTHER): Payer: PRIVATE HEALTH INSURANCE | Admitting: Cardiovascular Disease

## 2017-10-18 ENCOUNTER — Encounter: Payer: Self-pay | Admitting: Cardiovascular Disease

## 2017-10-18 VITALS — BP 134/80 | HR 64 | Ht 74.0 in | Wt 224.0 lb

## 2017-10-18 DIAGNOSIS — I480 Paroxysmal atrial fibrillation: Secondary | ICD-10-CM | POA: Diagnosis not present

## 2017-10-18 DIAGNOSIS — Z7901 Long term (current) use of anticoagulants: Secondary | ICD-10-CM

## 2017-10-18 DIAGNOSIS — I10 Essential (primary) hypertension: Secondary | ICD-10-CM | POA: Diagnosis not present

## 2017-10-18 DIAGNOSIS — I2510 Atherosclerotic heart disease of native coronary artery without angina pectoris: Secondary | ICD-10-CM | POA: Diagnosis not present

## 2017-10-18 DIAGNOSIS — E785 Hyperlipidemia, unspecified: Secondary | ICD-10-CM

## 2017-10-18 NOTE — Patient Instructions (Signed)
Medication Instructions:  Your physician recommends that you continue on your current medications as directed. Please refer to the Current Medication list given to you today.  Testing/Procedures: Your physician has requested that you have an echocardiogram in September. Echocardiography is a painless test that uses sound waves to create images of your heart. It provides your doctor with information about the size and shape of your heart and how well your heart's chambers and valves are working. This procedure takes approximately one hour. There are no restrictions for this procedure.  This will be done at our [facility] location:  [facility] has requested that you have an exercise stress myoview in September. For further information please visit [email or web address]. Please follow instruction sheet, as given.  Follow-Up: After testing in September/October  Any Other Special Instructions Will Be Listed Below (If Applicable).     If you need a refill on your cardiac medications before your next appointment, please call your pharmacy.

## 2017-10-18 NOTE — Progress Notes (Signed)
Patient ID: Michael Livingston, male   DOB: 11/21/1960, 57 y.o.   MRN: [id number]     HPI: Michael Livingston is a 57 y.o. male presents to the office for a one-week follow-up cardiology evaluation for undergoing DC cardioversion in the emergency room on October 10, 2017.  Michael Livingston has CAD and in October 2005 underwent stenting to his proximal to mid left anterior descending artery. His last catheterization was in October 2007 and his LAD was  was widely patent. He did have mild 30% circumflex stenosis. A nuclear perfusion study in January 2013 continued to show normal perfusion.  Michael Livingston has a history of palpitations which have been controlled with beta blocker therapy. He has a history of hyperlipidemia with an atherogenic lipid panel with low HDL levels. An NMR lipoprofile in 2014 demonstrated increased insulin resistance and an increased wall LDL particles.  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 Since that time, he has lost over 20 pounds. He denies recent chest pain. He denies significant increase in palpitations except a rare episode.   He is no longer is working for [employer], but is now owns a Games developer.  He denies any exertional chest pain or dyspnea.  He denies palpitations.  He denies PND, orthopnea.  Last year he underwent orthopedic surgery to his shoulder and tolerated this well.    He was started on metformin for diabetes mellitus.  In June 2017 hemoglobin A1c was 6.4, and most recently in January 2018  improved to 5.7.  I last saw him, he did note mild shortness of breath with arm raising and denied exertional chest pain or palpitations.  He was on aspirin and Plavix for dual antiplatelet therapy.  He was on ramipril 5 mg, metoprolol 25 mg twice a day for hypertension and Vytorin 1044.  Hyperlipidemia.  Heme notes mild shortness of breath when he raises his arms.  He denies exertional chest pain  symptoms or exertional dyspnea.  He continues to be on aspirin and Plavix for dual antiplatelet therapy.  He continues to be on ram a pro-5 mg, metoprolol 25 mg twice a day for hypertension.  He is on Vytorin 10/40 for hyperlipidemia, as well as lovaza.  Since I last saw him, he developed new onset atrial fibrillation on 01/19/2017.  [facility] went to [facility].  CBC be met were normal as was his TSH.  He underwent successful DC cardioversion at 150 J with restoration of sinus rhythm.  His Lopressor dose was increased to 30 mg twice a day, and he was started on Xarelto 20 mg for a cha2ds2score of 3 (CAD, hypertension, diabetes mellitus.  His Plavix was discontinued.  He saw Bernerd Pho for office follow-up.  He was doing well without recurrence.  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  The month previously, he had significantly banged his head on a piling intrahepatic and as result was very concerned about work-related trauma, particularly while being on Xarelto. He was unaware of any recurrent episodes of arrhythmia.  We had a lengthy discussion about risk-benefit ratio of anticoagulation, particularly with his job.  He preferred not continue Xarelto due to his job risk.  As result, Xarelto was discontinued and he was resumed back on aspirin 81 mg and Plavix 75 mg. I increased metoprolol recommended he  continue to take lisinopril 20 mg daily.  If he were to develop any recurrent episodes of AFeinitiation of anticoagulation would be necessary.  I last saw him in the office in March 2019 he apparently was only taking metoprolol tartrate once a day.  As result, I recommended switching him to metoprolol succinate allow for improved sustained action.  He had been doing well was unaware of any arrhythmias until October 10, 2017.  It was extremely hot, he was working on a roof, he was dehydrated, and he noticed his heart rate speeding  up.  He quickly got off the roof.  He was seen by Dr. Laurance Flatten and presented to [facility] ER where he was found to be in AF with ventricular rate in the 150s to 160s.  I saw him in the emergency room and in the ER he underwent successful cardioversion with restoration of sinus rhythm.  At that time he was advised to increase metoprolol succinate to 37.5 mg and instead of taking the medication at bedtime to take it in the morning.  He was also given a prescription for short acting diltiazem 30 mg to take on a as needed basis.  Over the past week he has felt well without recurrent symptomatology.  He believes he has noticed some mild shortness of breath.  He denies any chest tightness.  His last echo Doppler study was done in light 2018 and his last nuclear stress test in January 2013.  His insurance will be changing in August.  He presents for reevaluation.   Past Medical History:  Diagnosis Date  . Allergy   . Atrial fibrillation ([place])    a. diagnosed in 01/2017 --> s/p DCCV. Started on Xarelto for anticoagulation.   Marland Kitchen CAD (coronary artery disease)    a. s/p DES to LAD in 2005 b. patent by cath in 2007 with 30% LCx stenosis noted at that time c. normal NST in 05/2011  . Chronic kidney disease    1 kidney stone  . Diabetes mellitus without complication (HCC)    type 2, no meds needed  . Hyperlipidemia   . Hypertension   . Myocardial infarction [facility])     Past Surgical History:  Procedure Laterality Date  . CARDIAC SURGERY  2005   stent  . CORONARY STENT PLACEMENT    . KNEE ARTHROSCOPY     left 2003, right 2005  . SHOULDER SURGERY      Allergies  Allergen Reactions  . Aleve [Naproxen Sodium] Swelling    Mouth swells  . Oxycodone Other (See Comments)    Rendered the patient unable to fall asleep AND he "almost passed out"  . Sulfa Antibiotics Hives  . Vicodin [Hydrocodone-Acetaminophen] Other (See Comments)    Rendered the patient unable to fall asleep AND he "almost passed out"    Current  Outpatient Medications  Medication Sig Dispense Refill  . cetirizine (ZYRTEC) 10 MG tablet Take 10 mg by mouth daily.    Marland Kitchen diltiazem (CARDIZEM) 30 MG tablet Take 1 tablet (30 mg total) by mouth daily as needed. 30 tablet 1  . ezetimibe-simvastatin (VYTORIN) 10-40 MG tablet Take 1 tablet by mouth daily. (Patient taking differently: Take 1 tablet by mouth at bedtime. ) 90 tablet 3  . fluticasone (FLONASE) 50 MCG/ACT nasal spray Place 1 spray into both nostrils daily. 16 g 0  . metFORMIN (GLUCOPHAGE XR) 750 MG 24 hr tablet Take 1 tablet (750 mg total) by mouth daily with breakfast. (Patient taking differently: Take 750  mg by mouth at bedtime. ) 90 tablet 3  . metoprolol succinate (TOPROL XL) 25 MG 24 hr tablet Take 1.5 tablets (37.5 mg total) by mouth daily. 60 tablet 2  . Multiple Vitamins-Minerals (MULTIVITAMIN WITH MINERALS) tablet Take 1 tablet by mouth at bedtime.     . nitroGLYCERIN (NITROSTAT) 0.4 MG SL tablet Place 1 tablet (0.4 mg total) under the tongue every 5 (five) minutes as needed for chest pain. 25 tablet 2  . PAZEO 0.7 % SOLN INSTILL 1 DROP INTO INTO BOTH EYES EVERY MORNING AS NEEDED FOR ITCHING  4  . rivaroxaban (XARELTO) 20 MG TABS tablet Take 1 tablet (20 mg total) by mouth daily with supper. 30 tablet 2   No current facility-administered medications for this visit.     Social History   Socioeconomic History  . Marital status: Married    Spouse name: Not on file  . Number of children: Not on file  . Years of education: Not on file  . Highest education level: Not on file  Occupational History  . Not on file  Social Needs  . Financial resource strain: Not on file  . Food insecurity:    Worry: Not on file    Inability: Not on file  . Transportation needs:    Medical: Not on file    Non-medical: Not on file  Tobacco Use  . Smoking status: Former Smoker    Packs/day: 1.00    Years: 10.00    Pack years: 10.00    Start date: 07/16/1977    Last attempt to quit:  07/17/1987    Years since quitting: 30.2  . Smokeless tobacco: Never Used  Substance and Sexual Activity  . Alcohol use: No    Alcohol/week: 0.0 oz  . Drug use: No  . Sexual activity: Not on file  Lifestyle  . Physical activity:    Days per week: Not on file    Minutes per session: Not on file  . Stress: Not on file  Relationships  . Social connections:    Talks on phone: Not on file    Gets together: Not on file    Attends religious service: Not on file    Active member of club or organization: Not on file    Attends meetings of clubs or organizations: Not on file    Relationship status: Not on file  . Intimate partner violence:    Fear of current or ex partner: Not on file    Emotionally abused: Not on file    Physically abused: Not on file    Forced sexual activity: Not on file  Other Topics Concern  . Not on file  Social History Narrative  . Not on file    Family History  Problem Relation Age of Onset  . Diabetes Mother   . Heart disease Father   . Diabetes Father   . Heart disease Paternal Grandfather   . Colon cancer Neg Hx   . Esophageal cancer Neg Hx   . Stomach cancer Neg Hx   . Rectal cancer Neg Hx    Social history is notable that he is married and has 2 children. There is no tobacco or alcohol use. He has an autistic son.   ROS General: Negative; No fevers, chills, or night sweats; there is purposeful weight loss HEENT: Negative; No changes in vision or hearing, sinus congestion, difficulty swallowing Pulmonary: Negative; No cough, wheezing, shortness of breath, hemoptysis Cardiovascular: See history of present illness: No  presyncope, syncope, palpitations GI: Negative; No nausea, vomiting, diarrhea, or abdominal pain GU: Negative; No dysuria, hematuria, or difficulty voiding Musculoskeletal: Left shoulder labrum tear Hematologic/Oncology: Negative; no easy bruising, bleeding Endocrine: Negative; no heat/cold intolerance; no diabetes Neuro: Negative;  no changes in balance, headaches Skin: Negative; No rashes or skin lesions Psychiatric: Negative; No behavioral problems, depression Sleep: Negative; No snoring, daytime sleepiness, hypersomnolence, bruxism, restless legs, hypnogognic hallucinations, no cataplexy Other comprehensive 14 point system review is negative.   PE BP 134/80 (BP Location: Left Arm, Patient Position: Sitting, Cuff Size: Normal)   Pulse 64   Ht '6\' 2"'$  (1.88 m)   Wt 224 lb (101.6 kg)   BMI 28.76 kg/m    Repeat blood pressure was 130/70.  Wt Readings from Last 3 Encounters:  10/18/17 224 lb (101.6 kg)  10/10/17 215 lb (97.5 kg)  10/10/17 219 lb (99.3 kg)   General: Alert, oriented, no distress.  Skin: normal turgor, no rashes, warm and dry HEENT: Normocephalic, atraumatic. Pupils equal round and reactive to light; sclera anicteric; extraocular muscles intact;  Nose without nasal septal hypertrophy Mouth/Parynx benign; Mallinpatti scale 2 Neck: No JVD, no carotid bruits; normal carotid upstroke Lungs: clear to ausculatation and percussion; no wheezing or rales Chest wall: without tenderness to palpitation Heart: PMI not displaced, RRR, s1 s2 normal, 1/6 systolic murmur, no diastolic murmur, no rubs, gallops, thrills, or heaves Abdomen: soft, nontender; no hepatosplenomehaly, BS+; abdominal aorta nontender and not dilated by palpation. Back: no CVA tenderness Pulses 2+ Musculoskeletal: full range of motion, normal strength, no joint deformities Extremities: no clubbing cyanosis or edema, Homan's sign negative  Neurologic: grossly nonfocal; Cranial nerves grossly wnl Psychologic: Normal mood and affect  ECG (independently read by me): Sinus rhythm at 64 bpm.  Normal intervals.  No ectopy.  March 2018 ECG (independently read by me): normal sinus rhythm at 61 bpm.  Normal intervals.  No ectopy.  No ST segment changes.  December 2018 ECG (independently read by me):  Sinus bradycardia at 55 bpm.  PR interal 168  ms.  QTc inteval 417 ms. No ST-T changes.  February 2018 ECG (independently read by me): Sinus bradycardia at 48 bpm.  No ST segment changes.  Normal intervals.  June 2017 ECG (independently read by me): Sinus bradycardia 58 bpm.  Normal intervals.  No ST segment changes.  September 2016 ECG (independently read by me): Sinus bradycardia 58 bpm.  Mild RV conduction delay.  September 2015 ECG:( Independently read by me): Sinus bradycardia 50 beats per minute.  No ST segment changes.  Normal intervals.   November 2014 ECG: Sinus bradycardia at 45 beats per minute. No significant ST-T change.  LABS: BMP Latest Ref Rng & Units 10/10/2017 08/01/2017 01/19/2017  Glucose 65 - 99 mg/dL 133(H) 127(H) 134(H)  BUN 6 - 20 mg/dL '15 13 15  '$ Creatinine 0.61 - 1.24 mg/dL 1.05 0.92 0.96  BUN/Creat Ratio 9 - 20 - 14 -  Sodium 135 - 145 mmol/L 143 139 138  Potassium 3.5 - 5.1 mmol/L 4.0 4.6 4.1  Chloride 101 - 111 mmol/L 108 101 107  CO2 22 - 32 mmol/L '25 23 25  '$ Calcium 8.9 - 10.3 mg/dL 9.3 9.5 9.1   Hepatic Function Latest Ref Rng & Units 10/10/2017 08/01/2017 06/30/2016  Total Protein 6.5 - 8.1 g/dL 7.5 7.6 7.2  Albumin 3.5 - 5.0 g/dL 4.2 4.5 4.4  AST 15 - 41 U/L '25 21 16  '$ ALT 17 - 63 U/L '18 16 13  '$ Alk  Phosphatase 38 - 126 U/L 53 60 56  Total Bilirubin 0.3 - 1.2 mg/dL 1.0 0.7 1.2   CBC Latest Ref Rng & Units 10/10/2017 08/01/2017 01/19/2017  WBC 4.0 - 10.5 K/uL 9.5 5.1 7.2  Hemoglobin 13.0 - 17.0 g/dL 15.2 15.2 15.0  Hematocrit 39.0 - 52.0 % 43.5 43.5 42.3  Platelets 150 - 400 K/uL 217 218 213   Lab Results  Component Value Date   MCV 89.0 10/10/2017   MCV 91 08/01/2017   MCV 90.8 01/19/2017   Lab Results  Component Value Date   TSH 2.250 08/01/2017   Lab Results  Component Value Date   HGBA1C 5.9 03/21/2017   Lipid Panel     Component Value Date/Time   CHOL 105 08/01/2017 0813   CHOL 99 04/02/2013 0903   TRIG 94 08/01/2017 0813   TRIG 59 04/02/2013 0903   HDL 37 (L) 08/01/2017 0813    HDL 42 04/02/2013 0903   CHOLHDL 2.8 08/01/2017 0813   CHOLHDL 2.9 06/30/2016 1117   VLDL 17 06/30/2016 1117   LDLCALC 49 08/01/2017 0813   LDLCALC 45 04/02/2013 0903     RADIOLOGY: No results found.  IMPRESSION:  1. PAF (paroxysmal atrial fibrillation) ([place])   2. Coronary artery disease involving native coronary artery of native heart without angina pectoris   3. Essential hypertension   4. Hyperlipidemia with target LDL less than 70     ASSESSMENT AND PLAN: [name] is a 57 year old white male who is 14 years status post stenting to his proximal to mid LAD in October 2005.  At his last catheterization in October 2007 his LAD was widely patent and he had mild 30% narrowing.  A nuclear perfusion study in January 2013 continued to show normal perfusion.  He has not had any recurrent anginal symptomatology. He apparently developed an episode of atrial fibrillation with RVR leading to a 01/19/2017 ER evaluation.  He had new onset of symptomatology and was successfully cardioverted in the emergency room.  At that time his beta blocker regimen was increased and he was started on Xarelto and Plavix was discontinued.  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  I last saw him in March 2019, he had stated that he had banged his head working on a roof and was concerned about recurrent potential head trauma particularly on anticoagulation.  At that time, since he had not had any recurrent atrial fibrillation episodes his Xarelto was discontinued and he was switched back to aspirin and Plavix.  He was only taking short acting metoprolol once a day and this was switched to metoprolol succinate.  He had been doing well until last week when in the extreme heat while working on a roof and while stressed and dehydrated he noticed increased heart rate and was found to have recurrent AF.  I saw him in the emergency room shortly after he presented and ventricular rate was in the 150s.  In the ER,  he was successfully cardioverted since his AF had only been of several hours duration.  He was restarted back on Xarelto and Plavix and aspirin were discontinued.  He has not had any bleeding with recurrent anticoagulation.  He is now on increased beta-blocker therapy with Toprol-XL 37.5 mg in the morning.  He has not required his 30 mg as needed diltiazem regimen.  He continues to experience some episodes of shortness of breath particularly in the heat.  He denies any chest tightness.  His blood  pressure today is stable.  Last echo Doppler study was approximately 2 years ago.  He tells me his insurance will be significantly improving after his birthday in August.  I have suggested he undergo a follow-up echo Doppler study.  Since it is been 6 years  since his last ischemic evaluation, I have also suggested that he undergo an exercise nuclear perfusion study.  We will try to schedule these at the end of August or early September in light of his insurance change.  He will contact the office to be seen sooner if recurrent problems involved.  Continues to be on Vytorin 10/40 for hyperlipidemia.  Operatory in March 2019 showed an LDL cholesterol at 49 with total cholesterol 105 and HDL 37.  I will see him back in follow-up of the above studies and further recommendations were made at that time.     Troy Sine, MD, [facility]  10/18/2017 2:23 PM

## 2017-10-31 ENCOUNTER — Other Ambulatory Visit: Payer: Self-pay | Admitting: *Deleted

## 2017-10-31 DIAGNOSIS — I2510 Atherosclerotic heart disease of native coronary artery without angina pectoris: Secondary | ICD-10-CM

## 2017-10-31 DIAGNOSIS — I480 Paroxysmal atrial fibrillation: Secondary | ICD-10-CM

## 2017-10-31 DIAGNOSIS — I10 Essential (primary) hypertension: Secondary | ICD-10-CM

## 2017-11-01 ENCOUNTER — Other Ambulatory Visit (HOSPITAL_COMMUNITY): Payer: Self-pay

## 2017-11-01 ENCOUNTER — Telehealth (HOSPITAL_COMMUNITY): Payer: Self-pay

## 2017-11-01 ENCOUNTER — Other Ambulatory Visit: Payer: Self-pay | Admitting: Cardiology

## 2017-11-01 NOTE — Telephone Encounter (Signed)
Encounter complete. 

## 2017-11-03 ENCOUNTER — Other Ambulatory Visit: Payer: Self-pay

## 2017-11-03 ENCOUNTER — Ambulatory Visit (HOSPITAL_BASED_OUTPATIENT_CLINIC_OR_DEPARTMENT_OTHER): Payer: PRIVATE HEALTH INSURANCE

## 2017-11-03 ENCOUNTER — Ambulatory Visit (HOSPITAL_COMMUNITY)
Admission: RE | Admit: 2017-11-03 | Discharge: 2017-11-03 | Disposition: A | Discharge status: Home / Self Care | Payer: PRIVATE HEALTH INSURANCE | Source: Ambulatory Visit | Attending: Cardiovascular Disease | Admitting: Cardiovascular Disease

## 2017-11-03 DIAGNOSIS — I10 Essential (primary) hypertension: Secondary | ICD-10-CM

## 2017-11-03 DIAGNOSIS — R079 Chest pain, unspecified: Secondary | ICD-10-CM | POA: Insufficient documentation

## 2017-11-03 DIAGNOSIS — R5383 Other fatigue: Secondary | ICD-10-CM | POA: Insufficient documentation

## 2017-11-03 DIAGNOSIS — R9439 Abnormal result of other cardiovascular function study: Secondary | ICD-10-CM | POA: Diagnosis not present

## 2017-11-03 DIAGNOSIS — I252 Old myocardial infarction: Secondary | ICD-10-CM | POA: Insufficient documentation

## 2017-11-03 DIAGNOSIS — I480 Paroxysmal atrial fibrillation: Secondary | ICD-10-CM | POA: Insufficient documentation

## 2017-11-03 DIAGNOSIS — I2510 Atherosclerotic heart disease of native coronary artery without angina pectoris: Secondary | ICD-10-CM | POA: Diagnosis not present

## 2017-11-03 DIAGNOSIS — E119 Type 2 diabetes mellitus without complications: Secondary | ICD-10-CM | POA: Diagnosis not present

## 2017-11-03 DIAGNOSIS — Z87891 Personal history of nicotine dependence: Secondary | ICD-10-CM | POA: Insufficient documentation

## 2017-11-03 DIAGNOSIS — R002 Palpitations: Secondary | ICD-10-CM | POA: Insufficient documentation

## 2017-11-03 DIAGNOSIS — R0602 Shortness of breath: Secondary | ICD-10-CM | POA: Insufficient documentation

## 2017-11-03 DIAGNOSIS — I119 Hypertensive heart disease without heart failure: Secondary | ICD-10-CM | POA: Insufficient documentation

## 2017-11-03 DIAGNOSIS — E785 Hyperlipidemia, unspecified: Secondary | ICD-10-CM | POA: Diagnosis not present

## 2017-11-03 DIAGNOSIS — Z8249 Family history of ischemic heart disease and other diseases of the circulatory system: Secondary | ICD-10-CM | POA: Insufficient documentation

## 2017-11-03 LAB — ECHOCARDIOGRAM COMPLETE
HEIGHTINCHES: 74 in
WEIGHTICAEL: 3584 [oz_av]

## 2017-11-03 MED ORDER — TECHNETIUM TC 99M TETROFOSMIN IV KIT
31.5000 | PACK | Freq: Once | INTRAVENOUS | Status: AC | PRN
  Administered 2017-11-03: 31.5 via INTRAVENOUS
  Filled 2017-11-03: qty 32

## 2017-11-03 MED ORDER — TECHNETIUM TC 99M TETROFOSMIN IV KIT
11.0000 | PACK | Freq: Once | INTRAVENOUS | Status: AC | PRN
  Administered 2017-11-03: 11 via INTRAVENOUS
  Filled 2017-11-03: qty 11

## 2017-11-06 ENCOUNTER — Telehealth: Payer: Self-pay | Admitting: Cardiovascular Disease

## 2017-11-06 LAB — MYOCARDIAL PERFUSION IMAGING
CHL CUP MPHR: 166 {beats}/min
CHL CUP NUCLEAR SDS: 1
CHL CUP NUCLEAR SRS: 1
CHL CUP NUCLEAR SSS: 2
CSEPED: 12 min
CSEPEW: 13.7 METS
CSEPPHR: 150 {beats}/min
Exercise duration (sec): 30 s
LV dias vol: 144 mL (ref 62–150)
LVSYSVOL: 80 mL
NUC STRESS TID: 0.97
Percent HR: 90 %
RPE: 17
Rest HR: 51 {beats}/min

## 2017-11-06 NOTE — Telephone Encounter (Signed)
Returned call to patient, aware of echo and stress test results.      He states he continues to experience SOB with exertion.   He states that it is enough to bother him.    He is concerned about a previous blockage on the "back side of the heart" that he was told about in 2005.    Advised stress test did not indicate any blockages or ischemia.    Denies swelling, CP, other s/sx.    Advised I would review with Dr. Claiborne Billings and call back.

## 2017-11-06 NOTE — Telephone Encounter (Signed)
Echo shows normal systolic function with grade 2 diastolic dysfunction.  This can be contributed with shortness of breath due to reduced LV compliance particularly with activity.  His nuclear stress test remains low risk.

## 2017-11-06 NOTE — Telephone Encounter (Signed)
New Message:    Pt wants to know if his Echo and Stress Test results are ready form Friday please. He said to please call him asap, he is so anxious and nervous  for his results.

## 2017-11-07 NOTE — Telephone Encounter (Signed)
Patient made aware and verbalized understanding.   He will call back if symptoms progress or worsen.

## 2017-11-17 ENCOUNTER — Telehealth: Payer: Self-pay | Admitting: Cardiovascular Disease

## 2017-11-17 NOTE — Telephone Encounter (Signed)
Spoke with pt who states that he hasn't been feeling well lately and has had a lot of test/labs preformed to rule out certain things. He reports he had scrcoidosis a few years ago and wanted to inquire if his recent lab work would reflect that. Pt informed that his recent lab work would not detect that but to contact his pcp to inform them for further evaluations. Pt verbalized understanding.

## 2017-11-17 NOTE — Telephone Encounter (Signed)
New message    Patient would like to speak to you about his test results

## 2017-11-21 ENCOUNTER — Encounter: Payer: Self-pay | Admitting: Family Medicine

## 2017-11-21 ENCOUNTER — Ambulatory Visit: Payer: PRIVATE HEALTH INSURANCE | Admitting: Family Medicine

## 2017-11-21 ENCOUNTER — Ambulatory Visit (INDEPENDENT_AMBULATORY_CARE_PROVIDER_SITE_OTHER): Payer: PRIVATE HEALTH INSURANCE

## 2017-11-21 VITALS — BP 120/70 | HR 66 | Temp 97.9°F | Wt 221.0 lb

## 2017-11-21 DIAGNOSIS — R0600 Dyspnea, unspecified: Secondary | ICD-10-CM

## 2017-11-21 DIAGNOSIS — Z8679 Personal history of other diseases of the circulatory system: Secondary | ICD-10-CM | POA: Diagnosis not present

## 2017-11-21 DIAGNOSIS — E119 Type 2 diabetes mellitus without complications: Secondary | ICD-10-CM | POA: Diagnosis not present

## 2017-11-21 NOTE — Progress Notes (Signed)
Subjective:     Patient ID: Michael Livingston, male   DOB: 10/01/1960, 57 y.o.   MRN: [id number]  HPI Patient seen with some shortness of breath over the past several weeks. Several weeks ago he presented with A. fib and underwent cardioversion. His dyspea persisted and cardiology ordered a nuclear stress test which came back with no ischemia. He has not had any chest pain. 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  He states back in 1989 he was diagnosed with sarcoidosis and was on prednisone for over a year. He is concerned about possible recurrence. He had pneumonia back in January of this year and has not had any follow-up chest x-ray since then.. He quit smoking back in 1989. Chest x-ray from back in January showed apparently some heterogenous opacities left lower lung which were felt to probably represent pneumonia. There was recommendation for follow-up PA and lateral chest x-ray in 3-4 weeks but patient never came back for that.  Occasional cough. No hemoptysis. No pleuritic pain. Denies any fevers or chills.  He has history of type 2 diabetes. Not monitoring blood sugars regularly. Overdue for A1c.  Past Medical History:  Diagnosis Date  . Allergy   . Atrial fibrillation ([place])    a. diagnosed in 01/2017 --> s/p DCCV. Started on Xarelto for anticoagulation.   Marland Kitchen CAD (coronary artery disease)    a. s/p DES to LAD in 2005 b. patent by cath in 2007 with 30% LCx stenosis noted at that time c. normal NST in 05/2011  . Chronic kidney disease    1 kidney stone  . Diabetes mellitus without complication (HCC)    type 2, no meds needed  . Hyperlipidemia   . Hypertension   . Myocardial infarction [facility])    Past Surgical History:  Procedure Laterality Date  . CARDIAC SURGERY  2005   stent  . CORONARY STENT PLACEMENT    . KNEE ARTHROSCOPY     left 2003, right 2005  . SHOULDER SURGERY       reports that he quit smoking about 30 years ago. He started smoking about 40 years ago. He has a 10.00 pack-year smoking history. He has never used smokeless tobacco. He reports that he does not drink alcohol or use drugs. family history includes Diabetes in his father and mother; Heart disease in his father and paternal grandfather. Allergies  Allergen Reactions  . Aleve [Naproxen Sodium] Swelling    Mouth swells  . Oxycodone Other (See Comments)    Rendered the patient unable to fall asleep AND he "almost passed out"  . Sulfa Antibiotics Hives  . Vicodin [Hydrocodone-Acetaminophen] Other (See Comments)    Rendered the patient unable to fall asleep AND he "almost passed out"     Review of Systems  Constitutional: Positive for fatigue. Negative for appetite change, chills, fever and unexpected weight change.  Respiratory: Positive for cough and shortness of breath. Negative for wheezing.   Cardiovascular: Negative for chest pain, palpitations and leg swelling.       Objective:   Physical Exam  Constitutional: He appears well-developed and well-nourished.  HENT:  Mouth/Throat: Oropharynx is clear and moist.  Neck: Neck supple.  Cardiovascular: Normal rate and regular rhythm.  Pulmonary/Chest: Effort normal and breath sounds normal. He has no decreased breath sounds. He has no wheezes. He has no rhonchi. He has  no rales.  Musculoskeletal:       Right lower leg: He exhibits no edema.  Lymphadenopathy:    He has no cervical adenopathy.       Assessment:     #1 dyspnea over the past several weeks following recent cardioversion for A. fib. Nuclear stress test as above unremarkable. Reported remote history of sarcoidosis. Nonfocal exam at this time. He is in no respiratory distress with pulse oximetry 96%  #2 recent atrial fibrillation.  Currently regular rhythm  #3 type 2 diabetes    Plan:     -Repeat PA and lateral chest x-ray -Check further labs with CBC and hemoglobin  A1c. -Patient requesting angiotensin converting enzyme inhibitor levels. We discussed limitations both false positives and false negatives with this test  Eulas Post MD [facility]

## 2017-11-22 LAB — CBC WITH DIFFERENTIAL/PLATELET
BASOS ABS: 0.1 10*3/uL (ref 0.0–0.1)
Basophils Relative: 0.7 % (ref 0.0–3.0)
EOS ABS: 0.2 10*3/uL (ref 0.0–0.7)
Eosinophils Relative: 2.5 % (ref 0.0–5.0)
HCT: 41.6 % (ref 39.0–52.0)
Hemoglobin: 14.5 g/dL (ref 13.0–17.0)
LYMPHS ABS: 1.8 10*3/uL (ref 0.7–4.0)
Lymphocytes Relative: 25.2 % (ref 12.0–46.0)
MCHC: 34.8 g/dL (ref 30.0–36.0)
MCV: 93.1 fl (ref 78.0–100.0)
MONO ABS: 0.6 10*3/uL (ref 0.1–1.0)
MONOS PCT: 8.8 % (ref 3.0–12.0)
NEUTROS PCT: 62.8 % (ref 43.0–77.0)
Neutro Abs: 4.4 10*3/uL (ref 1.4–7.7)
Platelets: 217 10*3/uL (ref 150.0–400.0)
RBC: 4.47 Mil/uL (ref 4.22–5.81)
RDW: 12.9 % (ref 11.5–15.5)
WBC: 7 10*3/uL (ref 4.0–10.5)

## 2017-11-22 LAB — HEMOGLOBIN A1C: HEMOGLOBIN A1C: 6.9 % — AB (ref 4.6–6.5)

## 2017-11-22 LAB — ANGIOTENSIN CONVERTING ENZYME: ANGIOTENSIN-CONVERTING ENZYME: 86 U/L — AB (ref 9–67)

## 2017-11-23 ENCOUNTER — Telehealth: Payer: Self-pay

## 2017-11-23 NOTE — Addendum Note (Signed)
Addended by: Agnes Lawrence on: 11/23/2017 01:29 PM   Modules accepted: Orders

## 2017-11-23 NOTE — Telephone Encounter (Signed)
Please advise 

## 2017-11-23 NOTE — Telephone Encounter (Signed)
See results note-referral placed.

## 2017-11-23 NOTE — Telephone Encounter (Signed)
Copied from [place] #[id number]. Topic: Referral - Request >> Nov 23, 2017  8:30 AM Yvette Rack wrote: Reason for CRM: pt states that Dr Elease Hashimoto would like for him to go to a pulmonary Doctor due to CXR and he agrees to that referral please call pt at [phone]

## 2017-12-05 ENCOUNTER — Encounter: Payer: Self-pay | Admitting: Internal Medicine

## 2017-12-05 ENCOUNTER — Other Ambulatory Visit (INDEPENDENT_AMBULATORY_CARE_PROVIDER_SITE_OTHER): Payer: PRIVATE HEALTH INSURANCE

## 2017-12-05 ENCOUNTER — Ambulatory Visit: Payer: PRIVATE HEALTH INSURANCE | Admitting: Internal Medicine

## 2017-12-05 VITALS — BP 140/80 | HR 85 | Ht 74.0 in | Wt 222.6 lb

## 2017-12-05 DIAGNOSIS — R0609 Other forms of dyspnea: Secondary | ICD-10-CM

## 2017-12-05 DIAGNOSIS — D860 Sarcoidosis of lung: Secondary | ICD-10-CM | POA: Diagnosis not present

## 2017-12-05 LAB — CBC WITH DIFFERENTIAL/PLATELET
BASOS ABS: 0 10*3/uL (ref 0.0–0.1)
Basophils Relative: 0.4 % (ref 0.0–3.0)
EOS PCT: 2.2 % (ref 0.0–5.0)
Eosinophils Absolute: 0.2 10*3/uL (ref 0.0–0.7)
HCT: 41 % (ref 39.0–52.0)
Hemoglobin: 14.6 g/dL (ref 13.0–17.0)
LYMPHS PCT: 21.4 % (ref 12.0–46.0)
Lymphs Abs: 1.5 10*3/uL (ref 0.7–4.0)
MCHC: 35.5 g/dL (ref 30.0–36.0)
MCV: 91.6 fl (ref 78.0–100.0)
MONOS PCT: 11.5 % (ref 3.0–12.0)
Monocytes Absolute: 0.8 10*3/uL (ref 0.1–1.0)
NEUTROS ABS: 4.7 10*3/uL (ref 1.4–7.7)
NEUTROS PCT: 64.5 % (ref 43.0–77.0)
PLATELETS: 198 10*3/uL (ref 150.0–400.0)
RBC: 4.48 Mil/uL (ref 4.22–5.81)
RDW: 13 % (ref 11.5–15.5)
WBC: 7.2 10*3/uL (ref 4.0–10.5)

## 2017-12-05 LAB — BASIC METABOLIC PANEL
BUN: 14 mg/dL (ref 6–23)
CALCIUM: 9.4 mg/dL (ref 8.4–10.5)
CO2: 26 mEq/L (ref 19–32)
Chloride: 103 mEq/L (ref 96–112)
Creatinine, Ser: 1.04 mg/dL (ref 0.40–1.50)
GFR: 78.81 mL/min (ref >60.00)
Glucose, Bld: 151 mg/dL — ABNORMAL HIGH (ref 70–99)
Potassium: 3.8 mEq/L (ref 3.5–5.1)
SODIUM: 138 meq/L (ref 135–145)

## 2017-12-05 LAB — BRAIN NATRIURETIC PEPTIDE: PRO B NATRI PEPTIDE: 15 pg/mL (ref 0.0–100.0)

## 2017-12-05 LAB — TSH: TSH: 1.71 u[IU]/mL (ref 0.35–4.50)

## 2017-12-05 LAB — SEDIMENTATION RATE: Sed Rate: 15 mm/hr (ref 0–20)

## 2017-12-05 MED ORDER — PANTOPRAZOLE SODIUM 40 MG PO TBEC: 40.0000 mg | DELAYED_RELEASE_TABLET | Freq: Every day | ORAL | 2 refills | Status: DC

## 2017-12-05 MED ORDER — FAMOTIDINE 20 MG PO TABS: ORAL_TABLET | ORAL | 11 refills | Status: DC

## 2017-12-05 NOTE — Patient Instructions (Addendum)
Pantoprazole (protonix) 40 mg   Take  30-60 min before first meal of the day and Pepcid (famotidine)  20 mg one @  bedtime until return to office - this is the best way to tell whether stomach acid is contributing to your problem.     GERD (REFLUX)  is an extremely common cause of respiratory symptoms just like yours , many times with no obvious heartburn at all.    It can be treated with medication, but also with lifestyle changes including elevation of the head of your bed (ideally with 6 inch  bed blocks),  Smoking cessation, avoidance of late meals, excessive alcohol, and avoid fatty foods, chocolate, peppermint, colas, red wine, and acidic juices such as orange juice.  NO MINT OR MENTHOL PRODUCTS SO NO COUGH DROPS   USE SUGARLESS CANDY INSTEAD (Jolley ranchers or Stover's or Life Savers) or even ice chips will also do - the key is to swallow to prevent all throat clearing. NO OIL BASED VITAMINS - use powdered substitutes.  Please remember to go to the lab department downstairs in the basement  for your tests - we will call you with the results when they are available.      Please schedule a follow up office visit in 4 weeks, sooner if needed with pfts on return

## 2017-12-05 NOTE — Progress Notes (Signed)
Michael Livingston, male    DOB: 01-24-1961,    MRN: [id number]   Brief patient profile: 52 yowm quit smoking 1989 with dx sarcoid = gen chest discomfort and sob s cough > lung bx > prednisone x a year 60 mg /day and gained wt but no relapse p stopped it and then early July 2019 similar same distribution of soreness ant > post  R= L and worse doe esp in heat.   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  12/05/2017  1st pulmonary eval/ Daissy Yerian  Chief Complaint  Patient presents with  . Pulmonary Consult    Referred by Dr. Carolann Littler. Pt c/o SOB, chest and back soreness x 3 wks. He states he has a hx of sarcoid.     Dyspnea:  With walking outside in heat fast past or  [place] =  MMRC1 = can walk nl pace, flat grade, can't hurry or go uphills or steps s sob   Cough: none   Gen chest Discomfort is worse first thing in am / not related to eating/ ant > post and R=L not typically pleuritic at al.    No obvious day to day or daytime variability or assoc excess/ purulent sputum or mucus plugs or hemoptysis or     subjective wheeze or overt sinus or hb symptoms.   Sleep: on side 2 pillows without nocturnal  or early am exacerbation  of respiratory  c/o's or need for noct saba. Also denies any obvious fluctuation of symptoms with weather or environmental changes or other aggravating or alleviating factors except as outlined above   No unusual exposure hx or h/o childhood pna/ asthma or knowledge of premature birth.  Current Allergies, Complete Past Medical History, Past Surgical History, Family History, and Social History were reviewed in [employer] record.  ROS  The following are  not active complaints unless bolded Hoarseness, sore throat, dysphagia, dental problems, itching, sneezing,  nasal congestion or discharge of excess mucus or purulent secretions, ear ache,   fever, chills, sweats, unintended wt loss or wt gain, classically pleuritic or exertional cp,  orthopnea pnd or arm/hand swelling  or leg swelling, presyncope, palpitations, abdominal pain, anorexia, nausea, vomiting, diarrhea  or change in bowel habits or change in bladder habits, change in stools or change in urine, dysuria, hematuria,  rash, arthralgias, visual complaints, headache, numbness, weakness or ataxia or problems with walking or coordination,  change in mood or  memory.                Past Medical History:  Diagnosis Date  . Allergy   . Atrial fibrillation ([place])    a. diagnosed in 01/2017 --> s/p DCCV. Started on Xarelto for anticoagulation.   Marland Kitchen CAD (coronary artery disease)    a. s/p DES to LAD in 2005 b. patent by cath in 2007 with 30% LCx stenosis noted at that time c. normal NST in 05/2011  . Chronic kidney disease    1 kidney stone  . Diabetes mellitus without complication (HCC)    type 2, no meds needed  . Hyperlipidemia   . Hypertension   . Myocardial infarction ([place])  Outpatient Medications Prior to Visit  Medication Sig Dispense Refill  . diltiazem (CARDIZEM) 30 MG tablet TAKE 1 TABLET (30 MG TOTAL) BY MOUTH DAILY AS NEEDED. 30 tablet 11  . ezetimibe-simvastatin (VYTORIN) 10-40 MG tablet Take 1 tablet by mouth daily. (Patient taking differently: Take 1 tablet by mouth at bedtime. ) 90 tablet 3  . fluticasone (FLONASE) 50 MCG/ACT nasal spray Place 1 spray into both nostrils daily. (Patient taking differently: Place 1 spray into both nostrils daily as needed. ) 16 g 0  . metFORMIN (GLUCOPHAGE XR) 750 MG 24 hr tablet Take 1 tablet (750 mg total) by mouth daily with breakfast. (Patient taking differently: Take 750 mg by mouth at bedtime. ) 90 tablet 3  . metoprolol succinate  (TOPROL XL) 25 MG 24 hr tablet Take 1.5 tablets (37.5 mg total) by mouth daily. 60 tablet 2  . Multiple Vitamins-Minerals (MULTIVITAMIN WITH MINERALS) tablet Take 1 tablet by mouth at bedtime.     . nitroGLYCERIN (NITROSTAT) 0.4 MG SL tablet Place 1 tablet (0.4 mg total) under the tongue every 5 (five) minutes as needed for chest pain. 25 tablet 2  . PAZEO 0.7 % SOLN INSTILL 1 DROP INTO INTO BOTH EYES EVERY MORNING AS NEEDED FOR ITCHING  4  . rivaroxaban (XARELTO) 20 MG TABS tablet Take 1 tablet (20 mg total) by mouth daily with supper. 30 tablet 2  . cetirizine (ZYRTEC) 10 MG tablet Take 10 mg by mouth daily.     No facility-administered medications prior to visit.              Objective:     BP 140/80 (BP Location: Left Arm, Cuff Size: Normal)   Pulse 85   Ht _0  (1.88 m)   Wt 222 lb 9.6 oz (101 kg)   SpO2 95%   BMI 28.58 kg/m   SpO2: 95 %  RA  Wt Readings from Last 3 Encounters:  12/05/17 222 lb 9.6 oz (101 kg)  11/21/17 221 lb (100.2 kg)  11/03/17 224 lb (101.6 kg)     amb tense but pleasant wm nad     HEENT: nl dentition,   and oropharynx. Nl external ear canals without cough reflex - moderate bilateral non-specific turbinate edema     NECK :  without JVD/Nodes/TM/ nl carotid upstrokes bilaterally   LUNGS: no acc muscle use,  Nl contour chest which is clear to A and P bilaterally without cough on insp or exp maneuvers   CV:  RRR  no s3 or murmur or increase in P2, and no edema   ABD:  soft and nontender with nl inspiratory excursion in the supine position. No bruits or organomegaly appreciated, bowel sounds nl  MS:  Nl gait/ ext warm without deformities, calf tenderness, cyanosis or clubbing No obvious joint restrictions   SKIN: warm and dry without lesions    NEURO:  alert, approp, nl sensorium with  no motor or cerebellar deficits apparent.      I personally reviewed images and agree with radiology impression as follows:  CXR:   11/21/17  No acute  abnormality  My review mild residual chronic changes c/w prev sarcoid         Labs ordered/ reviewed:      Chemistry      Component Value Date/Time   NA 140 12/06/2017 0143   NA 139 08/01/2017 0813   K 3.8 12/06/2017 0143   CL 104 12/06/2017 0143   CO2 26 12/06/2017 0143  BUN 16 12/06/2017 0143   BUN 13 08/01/2017 0813   CREATININE 1.08 12/06/2017 0143   CREATININE 0.95 06/30/2016 1117      Component Value Date/Time   CALCIUM 9.7 12/06/2017 0143   ALKPHOS 53 10/10/2017 1946   AST 25 10/10/2017 1946   ALT 18 10/10/2017 1946   BILITOT 1.0 10/10/2017 1946   BILITOT 0.7 08/01/2017 0813        Lab Results  Component Value Date   WBC 7.7 12/06/2017   HGB 14.9 12/06/2017   HCT 44.1 12/06/2017   MCV 93.6 12/06/2017   PLT 199 12/06/2017       EOS                                                               0.2                                    12/05/2017     Lab Results  Component Value Date   TSH 1.71 12/05/2017     Lab Results  Component Value Date   PROBNP 15.0 12/05/2017       Lab Results  Component Value Date   ESRSEDRATE 15 12/05/2017      ACE     86 on 11/21/17     Labs ordered 12/05/2017  Allergy profile     Assessment   DOE (dyspnea on exertion) 12/05/2017  Walked RA x 3 laps @ 185 ft each stopped due to  End of study, very  fast pace, no sob or desat     Symptoms are markedly disproportionate to objective findings and not clear to what extent this is actually a pulmonary  problem but pt does appear to have difficult to sort out respiratory symptoms of unknown origin for which  DDX  = almost all start with A and  include Adherence, Ace Inhibitors, Acid Reflux, Active Sinus Disease, Alpha 1 Antitripsin deficiency, Anxiety masquerading as Airways dz,  ABPA,  Allergy(esp in young), Aspiration (esp in elderly), Adverse effects of meds,  Active smokers, A bunch of PE's/clot burden (a few small clots can't cause this syndrome unless there is already  severe underlying pulm or vascular dz with poor reserve),  Anemia or thyroid disorder, plus two Bs  = Bronchiectasis and Beta blocker use..and one C= CHF     Adherence is always the initial "prime suspect" and is a multilayered concern that requires a "trust but verify" approach in every patient - starting with knowing how to use medications, especially inhalers, correctly, keeping up with refills and understanding the fundamental difference between maintenance and prns vs those medications only taken for a very short course and then stopped and not refilled.   ? Acid (or non-acid) GERD > always difficult to exclude as up to 75% of pts in some series report no assoc GI/ Heartburn symptoms and he has documented small HH by DgEs 2001 > rec max (24h)  acid suppression and diet restrictions/ reviewed and instructions given in writing.   ? Allergy > doubt asthma but send profile and return for pfts before and after saba   ? Anxiety > usually at the bottom of this list of usual suspects  but should be much higher on this pt's based on H and P and note  may interfere with adherence and also interpretation of response or lack thereof to symptom management which can be quite subjective.   ? Adverse drug effects > reviewed MAR and never on amiodarone or other suspects  ? A bunch of PE's > note was on xarelto at onset so this is unlikely   ? Beta blocker effects > doubt at low doses lopressor - if higher doses needed consider bisprolol, the most most selective BB on the generic marked  ? Cardiac >  ? Still having intermittent Afib > cards w/u reassuring to date > will need pfts prior to committing to amiodarone if that is what is being considered/ baseline ESR is nl        Pulmonary sarcoidosis ([place]) Dx/ rx in 1989 x one year - ACE level   86 on 11/21/17   A good rule of thumb is that >95% of pts with active sarcoid in any organ will have some ibvious plain cxr changes - on the other hand  if there  are active pulmonary symptoms the cxr will look much worse than the patient:  No evidence of either scenario here/ strongly doubt active dz with a cxr that looks like remote sarcoid and CT unlikely to help with decision making re need for bx or repeat steroid challenge (was too sensitive use in sarcoid f/u in my experience and leads to a lot of unnecessary anxiety/ biopsies and over use of prednisone for a very benign process with minimal physiologic benefit)   Will return for full pfts to complete the w/u       Total time devoted to counseling  > 50 % of initial 60 min office visit:  review case with pt/ discussion of options/alternatives/ personally creating written customized instructions  in presence of pt  then going over those specific  Instructions directly with the pt including how to use all of the meds but in particular covering each new medication in detail and the difference between the maintenance= "automatic" meds and the prns using an action plan format for the latter (If this problem/symptom => do that organization reading Left to right).  Please see AVS from this visit for a full list of these instructions which I personally wrote for this pt and  are unique to this visit.      Christinia Gully, MD 12/05/2017

## 2017-12-06 ENCOUNTER — Emergency Department (HOSPITAL_COMMUNITY): Payer: PRIVATE HEALTH INSURANCE

## 2017-12-06 ENCOUNTER — Encounter: Payer: Self-pay | Admitting: Internal Medicine

## 2017-12-06 ENCOUNTER — Encounter (HOSPITAL_COMMUNITY): Payer: Self-pay

## 2017-12-06 ENCOUNTER — Telehealth: Payer: Self-pay | Admitting: Internal Medicine

## 2017-12-06 ENCOUNTER — Telehealth: Payer: Self-pay | Admitting: Cardiovascular Disease

## 2017-12-06 ENCOUNTER — Emergency Department (HOSPITAL_COMMUNITY)
Admission: EM | Admit: 2017-12-06 | Discharge: 2017-12-06 | Disposition: A | Discharge status: Home / Self Care | Payer: PRIVATE HEALTH INSURANCE | Attending: Emergency Medicine | Admitting: Emergency Medicine

## 2017-12-06 DIAGNOSIS — D860 Sarcoidosis of lung: Secondary | ICD-10-CM | POA: Insufficient documentation

## 2017-12-06 DIAGNOSIS — E119 Type 2 diabetes mellitus without complications: Secondary | ICD-10-CM | POA: Diagnosis not present

## 2017-12-06 DIAGNOSIS — I129 Hypertensive chronic kidney disease with stage 1 through stage 4 chronic kidney disease, or unspecified chronic kidney disease: Secondary | ICD-10-CM | POA: Insufficient documentation

## 2017-12-06 DIAGNOSIS — I4891 Unspecified atrial fibrillation: Secondary | ICD-10-CM | POA: Insufficient documentation

## 2017-12-06 DIAGNOSIS — Z7901 Long term (current) use of anticoagulants: Secondary | ICD-10-CM | POA: Insufficient documentation

## 2017-12-06 DIAGNOSIS — Z79899 Other long term (current) drug therapy: Secondary | ICD-10-CM | POA: Diagnosis not present

## 2017-12-06 DIAGNOSIS — N189 Chronic kidney disease, unspecified: Secondary | ICD-10-CM | POA: Diagnosis not present

## 2017-12-06 DIAGNOSIS — I2510 Atherosclerotic heart disease of native coronary artery without angina pectoris: Secondary | ICD-10-CM | POA: Insufficient documentation

## 2017-12-06 DIAGNOSIS — R002 Palpitations: Secondary | ICD-10-CM | POA: Diagnosis present

## 2017-12-06 DIAGNOSIS — Z87891 Personal history of nicotine dependence: Secondary | ICD-10-CM | POA: Diagnosis not present

## 2017-12-06 LAB — BASIC METABOLIC PANEL
Anion gap: 10 (ref 5–15)
BUN: 16 mg/dL (ref 6–20)
CO2: 26 mmol/L (ref 22–32)
Calcium: 9.7 mg/dL (ref 8.9–10.3)
Chloride: 104 mmol/L (ref 98–111)
Creatinine, Ser: 1.08 mg/dL (ref 0.61–1.24)
GFR calc Af Amer: >60 mL/min (ref >60)
GFR calc non Af Amer: >60 mL/min (ref >60)
Glucose, Bld: 124 mg/dL — ABNORMAL HIGH (ref 70–99)
Potassium: 3.8 mmol/L (ref 3.5–5.1)
SODIUM: 140 mmol/L (ref 135–145)

## 2017-12-06 LAB — CBC
HCT: 44.1 % (ref 39.0–52.0)
Hemoglobin: 14.9 g/dL (ref 13.0–17.0)
MCH: 31.6 pg (ref 26.0–34.0)
MCHC: 33.8 g/dL (ref 30.0–36.0)
MCV: 93.6 fL (ref 78.0–100.0)
Platelets: 199 10*3/uL (ref 150–400)
RBC: 4.71 MIL/uL (ref 4.22–5.81)
RDW: 12.4 % (ref 11.5–15.5)
WBC: 7.7 10*3/uL (ref 4.0–10.5)

## 2017-12-06 LAB — RESPIRATORY ALLERGY PROFILE REGION II ~~LOC~~
ALLERGEN, OAK, T7: 5.99 kU/L — AB
Allergen, A. alternata, m6: <0.1 kU/L
Allergen, Cedar tree, t12: 0.52 kU/L — ABNORMAL HIGH
Allergen, Comm Silver Birch, t9: 7.39 kU/L — ABNORMAL HIGH
Allergen, Cottonwood, t14: 2.04 kU/L — ABNORMAL HIGH
Allergen, Mulberry, t76: <0.1 kU/L
Aspergillus fumigatus, m3: <0.1 kU/L
BOX ELDER: 2.62 kU/L — AB
Bermuda Grass: 3.04 kU/L — ABNORMAL HIGH
CLADOSPORIUM HERBARUM (M2) IGE: <0.1 kU/L
CLASS: 0
CLASS: 0
CLASS: 0
CLASS: 0
CLASS: 2
CLASS: 2
CLASS: 2
CLASS: 2
CLASS: 2
CLASS: 3
COMMON RAGWEED (SHORT) (W1) IGE: 2.8 kU/L — ABNORMAL HIGH
Class: 0
Class: 0
Class: 0
Class: 0
Class: 0
Class: 0
Class: 0
Class: 1
Class: 2
Class: 2
Class: 2
Class: 2
Class: 3
Class: 3
Dog Dander: 0.13 kU/L — ABNORMAL HIGH
Elm IgE: 2.42 kU/L — ABNORMAL HIGH
IgE (Immunoglobulin E), Serum: 72 kU/L (ref <=114)
Johnson Grass: 1.26 kU/L — ABNORMAL HIGH
Pecan/Hickory Tree IgE: 3.24 kU/L — ABNORMAL HIGH
Rough Pigweed  IgE: 1.92 kU/L — ABNORMAL HIGH
Sheep Sorrel IgE: 2.37 kU/L — ABNORMAL HIGH
TIMOTHY GRASS: 13.3 kU/L — AB

## 2017-12-06 LAB — INTERPRETATION:

## 2017-12-06 LAB — PROTIME-INR
INR: 1.86
Prothrombin Time: 21.2 seconds — ABNORMAL HIGH (ref 11.4–15.2)

## 2017-12-06 LAB — I-STAT TROPONIN, ED: TROPONIN I, POC: 0 ng/mL (ref 0.00–0.08)

## 2017-12-06 MED ORDER — SODIUM CHLORIDE 0.9 % IV SOLN
INTRAVENOUS | Status: AC | PRN
  Administered 2017-12-06: 1000 mL via INTRAVENOUS

## 2017-12-06 MED ORDER — PROPOFOL 10 MG/ML IV BOLUS
0.5000 mg/kg | Freq: Once | INTRAVENOUS | Status: DC
  Filled 2017-12-06: qty 20

## 2017-12-06 MED ORDER — PROPOFOL 10 MG/ML IV BOLUS
INTRAVENOUS | Status: AC | PRN
  Administered 2017-12-06: 50 mg via INTRAVENOUS

## 2017-12-06 NOTE — Telephone Encounter (Signed)
LVM for pt that MW is aware of info on pt is in Afib this morning at [facility] Nothing further needed at this time.

## 2017-12-06 NOTE — Discharge Instructions (Addendum)
Call Dr. Claiborne Billings for further adjustments of your medication.  Return to the ED if you develop chest pain, shortness of breath or any other concerns.

## 2017-12-06 NOTE — ED Notes (Signed)
Patient wanting to leave states he has an autistic child at home and his wife can;t leave to come get him. Patient is aware of the cons of driving, Dr. Wyvonnia Dusky spoke with patient and patient still wants to leave AMA, and will call back to hospital when he arrives home.

## 2017-12-06 NOTE — Progress Notes (Signed)
RT at beside for sychronized cardioversion of patient.  Patient shocked, went back into sinus rhythm.  RT noted a brief moment of apnea, patient now stable.  Vital signs 85 HR, 100% O2 sat, Rate 15.

## 2017-12-06 NOTE — Assessment & Plan Note (Addendum)
Dx/ rx in 1989 x one year - ACE level   86 on 11/21/17   A good rule of thumb is that >95% of pts with active sarcoid in any organ will have some ibvious plain cxr changes - on the other hand  if there are active pulmonary symptoms the cxr will look much worse than the patient:  No evidence of either scenario here/ strongly doubt active dz with a cxr that looks like remote sarcoid and CT unlikely to help with decision making re need for bx or repeat steroid challenge (was too sensitive use in sarcoid f/u in my experience and leads to a lot of unnecessary anxiety/ biopsies and over use of prednisone for a very benign process with minimal physiologic benefit)   Will return for full pfts to complete the w/u       Total time devoted to counseling  > 50 % of initial 60 min office visit:  review case with pt/ discussion of options/alternatives/ personally creating written customized instructions  in presence of pt  then going over those specific  Instructions directly with the pt including how to use all of the meds but in particular covering each new medication in detail and the difference between the maintenance= "automatic" meds and the prns using an action plan format for the latter (If this problem/symptom => do that organization reading Left to right).  Please see AVS from this visit for a full list of these instructions which I personally wrote for this pt and  are unique to this visit.

## 2017-12-06 NOTE — Telephone Encounter (Signed)
Called and spoke with patient regarding being in afib again  Pt went to [facility] this morning found he's in Afib again; wanted MW to know  MW please advise

## 2017-12-06 NOTE — ED Notes (Signed)
ED Provider at bedside. 

## 2017-12-06 NOTE — Telephone Encounter (Signed)
Per chart pt was seen in the ED for Afib with RVR and was cardioverted back to NSR. Pt was instructed to follow up with cardiology office in 2 days. Appointment scheduled for 12/11/17 at 1130 pm with Fabian Sharp, [place].

## 2017-12-06 NOTE — Telephone Encounter (Signed)
New message    Patient calling regarding afib.  Patient went to ED, discharge summary states:  Follow-Ups: Schedule an appointment with Troy Sine, MD (Cardiology) in 2 days (12/08/2017)   Please advise

## 2017-12-06 NOTE — Assessment & Plan Note (Addendum)
12/05/2017  Walked RA x 3 laps @ 185 ft each stopped due to  End of study, very  fast pace, no sob or desat     Symptoms are markedly disproportionate to objective findings and not clear to what extent this is actually a pulmonary  problem but pt does appear to have difficult to sort out respiratory symptoms of unknown origin for which  DDX  = almost all start with A and  include Adherence, Ace Inhibitors, Acid Reflux, Active Sinus Disease, Alpha 1 Antitripsin deficiency, Anxiety masquerading as Airways dz,  ABPA,  Allergy(esp in young), Aspiration (esp in elderly), Adverse effects of meds,  Active smokers, A bunch of PE's/clot burden (a few small clots can't cause this syndrome unless there is already severe underlying pulm or vascular dz with poor reserve),  Anemia or thyroid disorder, plus two Bs  = Bronchiectasis and Beta blocker use..and one C= CHF     Adherence is always the initial "prime suspect" and is a multilayered concern that requires a "trust but verify" approach in every patient - starting with knowing how to use medications, especially inhalers, correctly, keeping up with refills and understanding the fundamental difference between maintenance and prns vs those medications only taken for a very short course and then stopped and not refilled.   ? Acid (or non-acid) GERD > always difficult to exclude as up to 75% of pts in some series report no assoc GI/ Heartburn symptoms and he has documented small HH by DgEs 2001 > rec max (24h)  acid suppression and diet restrictions/ reviewed and instructions given in writing.   ? Allergy > doubt asthma but send profile and return for pfts before and after saba   ? Anxiety > usually at the bottom of this list of usual suspects but should be much higher on this pt's based on H and P and note  may interfere with adherence and also interpretation of response or lack thereof to symptom management which can be quite subjective.   ? Adverse drug effects >  reviewed MAR and never on amiodarone or other suspects  ? A bunch of PE's > note was on xarelto at onset so this is unlikely   ? Beta blocker effects > doubt at low doses lopressor - if higher doses needed consider bisprolol, the most most selective BB on the generic marked  ? Cardiac >  ? Still having intermittent Afib > cards w/u reassuring to date > will need pfts prior to committing to amiodarone if that is what is being considered/ baseline ESR is nl

## 2017-12-06 NOTE — Telephone Encounter (Signed)
Aware - no change in recs

## 2017-12-06 NOTE — ED Provider Notes (Signed)
[place] EMERGENCY DEPARTMENT Provider Note   CSN: [id number] Arrival date & time: 12/06/17  0131     History   Chief Complaint Chief Complaint  Patient presents with  . Atrial Fibrillation    HPI Michael Livingston is a 57 y.o. male.  Patient with history of paroxysmal atrial fibrillation presenting with palpitations that woke him from sleep at 1230.  States he went to bed at 10 PM with no palpitations.  He feels he is in A. fib with RVR again.  He is required cardioversion several times in the past and is requesting this today.  He denies any chest pain or shortness of breath.  States compliance with his medications including his Xarelto which she has been on for greater than 1 month.  He did take a as needed Cardizem today but  without any effect.  States compliance with his other medications.  Denies any abdominal pain, nausea, vomiting, headache.  No focal weakness, numbness or tingling.  The history is provided by the patient.  Atrial Fibrillation  Pertinent negatives include no chest pain, no abdominal pain, no headaches and no shortness of breath.    Past Medical History:  Diagnosis Date  . Allergy   . Atrial fibrillation ([place])    a. diagnosed in 01/2017 --> s/p DCCV. Started on Xarelto for anticoagulation.   Marland Kitchen CAD (coronary artery disease)    a. s/p DES to LAD in 2005 b. patent by cath in 2007 with 30% LCx stenosis noted at that time c. normal NST in 05/2011  . Chronic kidney disease    1 kidney stone  . Diabetes mellitus without complication (HCC)    type 2, no meds needed  . Hyperlipidemia   . Hypertension   . Myocardial infarction [facility])     Patient Active Problem List   Diagnosis Date Noted  . DOE (dyspnea on exertion) 12/05/2017  . Coronary artery disease involving native coronary artery of native heart without angina pectoris   . Atrial fibrillation with RVR ([place]) 01/30/2017  . Preoperative clearance 01/24/2015  . Metabolic syndrome  37/16/9678  . Hyperlipidemia with target LDL less than 70 04/02/2013  . Type 2 diabetes mellitus without complication, without long-term current use of insulin ([place]) 07/16/2012  . Hyperlipidemia, mixed 07/16/2012  . Coronary atherosclerosis 02/06/2009  . ABDOMINAL PAIN, UPPER 02/06/2009    Past Surgical History:  Procedure Laterality Date  . CARDIAC SURGERY  2005   stent  . CORONARY STENT PLACEMENT    . KNEE ARTHROSCOPY     left 2003, right 2005  . SHOULDER SURGERY          Home Medications    Prior to Admission medications   Medication Sig Start Date End Date Taking? Authorizing Provider  diltiazem (CARDIZEM) 30 MG tablet TAKE 1 TABLET (30 MG TOTAL) BY MOUTH DAILY AS NEEDED. 11/02/17 11/02/18  Troy Sine, MD  ezetimibe-simvastatin (VYTORIN) 10-40 MG tablet Take 1 tablet by mouth daily. Patient taking differently: Take 1 tablet by mouth at bedtime.  06/13/17   Troy Sine, MD  famotidine (PEPCID) 20 MG tablet One at bedtime 12/05/17   Tanda Rockers, MD  fluticasone [facility]) 50 MCG/ACT nasal spray Place 1 spray into both nostrils daily. Patient taking differently: Place 1 spray into both nostrils daily as needed for allergies or rhinitis.  06/08/17   Billie Ruddy, MD  metFORMIN (GLUCOPHAGE XR) 750 MG 24 hr tablet Take 1 tablet (750 mg total) by mouth daily  with breakfast. Patient taking differently: Take 750 mg by mouth at bedtime.  07/12/17   Burchette, Alinda Sierras, MD  metoprolol succinate (TOPROL XL) 25 MG 24 hr tablet Take 1.5 tablets (37.5 mg total) by mouth daily. 10/10/17   Cheryln Manly, NP  Multiple Vitamins-Minerals (MULTIVITAMIN WITH MINERALS) tablet Take 1 tablet by mouth at bedtime.     [provider]  nitroGLYCERIN (NITROSTAT) 0.4 MG SL tablet Place 1 tablet (0.4 mg total) under the tongue every 5 (five) minutes as needed for chest pain. 01/30/17   Strader, Fransisco Hertz, PA-C  pantoprazole (PROTONIX) 40 MG tablet Take 1 tablet (40 mg total) by mouth  daily. Take 30-60 min before first meal of the day 12/05/17   Tanda Rockers, MD  PAZEO 0.7 % SOLN INSTILL 1 DROP INTO INTO BOTH EYES EVERY MORNING AS NEEDED FOR ITCHING 10/03/15   [provider]  rivaroxaban (XARELTO) 20 MG TABS tablet Take 1 tablet (20 mg total) by mouth daily with supper. 10/10/17   Cheryln Manly, NP    Family History Family History  Problem Relation Age of Onset  . Diabetes Mother   . Heart disease Father   . Diabetes Father   . Heart disease Paternal Grandfather   . Colon cancer Neg Hx   . Esophageal cancer Neg Hx   . Stomach cancer Neg Hx   . Rectal cancer Neg Hx     Social History Social History   Tobacco Use  . Smoking status: Former Smoker    Packs/day: 1.00    Years: 8.00    Pack years: 8.00    Last attempt to quit: 07/17/1987    Years since quitting: 30.4  . Smokeless tobacco: Never Used  Substance Use Topics  . Alcohol use: No    Alcohol/week: 0.0 oz  . Drug use: No     Allergies   Aleve [naproxen sodium]; Oxycodone; Sulfa antibiotics; and Vicodin [hydrocodone-acetaminophen]   Review of Systems Review of Systems  Constitutional: Negative for activity change and appetite change.  HENT: Negative for congestion.   Respiratory: Negative for chest tightness and shortness of breath.   Cardiovascular: Positive for palpitations. Negative for chest pain.  Gastrointestinal: Negative for abdominal pain, nausea and vomiting.  Genitourinary: Negative for dysuria, hematuria and urgency.  Musculoskeletal: Negative for arthralgias and myalgias.  Skin: Negative for rash.  Neurological: Negative for dizziness, weakness and headaches.    all other systems are negative except as noted in the HPI and PMH.    Physical Exam Updated Vital Signs BP (!) 155/86   Pulse 92   Temp 98.5 F (36.9 C) (Oral)   Resp 19   Ht 6\' 2"  (1.88 m)   Wt 100.7 kg (222 lb)   SpO2 98%   BMI 28.50 kg/m   Physical Exam  Constitutional: He is oriented to  person, place, and time. He appears well-developed and well-nourished. No distress.  HENT:  Head: Normocephalic and atraumatic.  Mouth/Throat: Oropharynx is clear and moist. No oropharyngeal exudate.  Eyes: Pupils are equal, round, and reactive to light. Conjunctivae and EOM are normal.  Neck: Normal range of motion. Neck supple.  No meningismus.  Cardiovascular: Normal rate, normal heart sounds and intact distal pulses. Exam reveals no gallop.  No murmur heard. Irregular tachycardia  Pulmonary/Chest: Effort normal and breath sounds normal. No respiratory distress. He exhibits no tenderness.  Abdominal: Soft. There is no tenderness. There is no rebound and no guarding.  Musculoskeletal: Normal range of  motion. He exhibits no edema or tenderness.  Neurological: He is alert and oriented to person, place, and time. No cranial nerve deficit. He exhibits normal muscle tone. Coordination normal.  No ataxia on finger to nose bilaterally. No pronator drift. 5/5 strength throughout. CN 2-12 intact.Equal grip strength. Sensation intact.   Skin: Skin is warm.  Psychiatric: He has a normal mood and affect. His behavior is normal.  Nursing note and vitals reviewed.    ED Treatments / Results  Labs (all labs ordered are listed, but only abnormal results are displayed) Labs Reviewed  BASIC METABOLIC PANEL - Abnormal; Notable for the following components:      Result Value   Glucose, Bld 124 (*)    All other components within normal limits  PROTIME-INR - Abnormal; Notable for the following components:   Prothrombin Time 21.2 (*)    All other components within normal limits  CBC  I-STAT TROPONIN, ED    EKG EKG Interpretation  Date/Time:  Wednesday December 06 2017 01:59:18 EDT Ventricular Rate:  93 PR Interval:    QRS Duration: 103 QT Interval:  336 QTC Calculation: 418 R Axis:   -6 Text Interpretation:  Sinus rhythm Abnormal R-wave progression, early transition Baseline wander in lead(s) V3  now sinus Confirmed by Ezequiel Essex [phone]) on 12/06/2017 2:09:44 AM   Radiology Dg Chest Portable 1 View  Result Date: 12/06/2017 CLINICAL DATA:  Initial evaluation for acute chest pain, atrial fibrillation. EXAM: PORTABLE CHEST 1 VIEW COMPARISON:  Prior radiograph from 11/21/2017. FINDINGS: Cardiac and mediastinal silhouettes stable, and remain within normal limits. Different related ir pad overlies the left chest. Lungs normally inflated. No focal infiltrates. No pulmonary edema or pleural effusion. No pneumothorax. No acute osseus abnormality. IMPRESSION: No active cardiopulmonary disease. Electronically Signed   By: Jeannine Boga M.D.   On: 12/06/2017 02:15    Procedures .Sedation Date/Time: 12/06/2017 2:19 AM Performed by: Ezequiel Essex, MD Authorized by: Ezequiel Essex, MD   Consent:    Consent obtained:  Emergent situation and written   Consent given by:  Patient   Risks discussed:  Allergic reaction, inadequate sedation, nausea, prolonged hypoxia resulting in organ damage, prolonged sedation necessitating reversal, respiratory compromise necessitating ventilatory assistance and intubation and vomiting   Alternatives discussed:  Analgesia without sedation Universal protocol:    Procedure explained and questions answered to patient or proxy's satisfaction: yes     Relevant documents present and verified: yes     Test results available and properly labeled: yes     Immediately prior to procedure a time out was called: yes   Indications:    Procedure performed:  Cardioversion   Procedure necessitating sedation performed by:  Physician performing sedation   Intended level of sedation:  Deep Pre-sedation assessment:    Time since last food or drink:  6   NPO status caution: unable to specify NPO status     ASA classification: class 1 - normal, healthy patient     Neck mobility: normal     Mouth opening:  3 or more finger widths   Mallampati score:  I - soft palate,  uvula, fauces, pillars visible   Pre-sedation assessments completed and reviewed: airway patency, cardiovascular function, mental status and nausea/vomiting     Pre-sedation assessment completed:  12/06/2017 1:56 AM Immediate pre-procedure details:    Reassessment: Patient reassessed immediately prior to procedure     Reviewed: vital signs, relevant labs/tests and NPO status     Verified: bag valve mask  available, emergency equipment available, intubation equipment available, IV patency confirmed and oxygen available   Procedure details (see MAR for exact dosages):    Preoxygenation:  Nasal cannula   Sedation:  Propofol   Intra-procedure monitoring:  Cardiac monitor, blood pressure monitoring, continuous pulse oximetry, continuous capnometry, frequent LOC assessments and frequent vital sign checks   Intra-procedure events: none     Total Provider sedation time (minutes):  10 Post-procedure details:    Post-sedation assessment completed:  12/06/2017 2:10 AM   Attendance: Constant attendance by certified staff until patient recovered     Recovery: Patient returned to pre-procedure baseline     Post-sedation assessments completed and reviewed: airway patency, cardiovascular function, mental status and nausea/vomiting     Patient is stable for discharge or admission: yes     Patient tolerance:  Tolerated well, no immediate complications .Cardioversion Date/Time: 12/06/2017 2:20 AM Performed by: Ezequiel Essex, MD Authorized by: Ezequiel Essex, MD   Consent:    Consent obtained:  Emergent situation and written   Consent given by:  Patient   Risks discussed:  Cutaneous burn, death and induced arrhythmia   Alternatives discussed:  Rate-control medication and delayed treatment Pre-procedure details:    Cardioversion basis:  Emergent   Rhythm:  Atrial fibrillation   Electrode placement:  Anterior-posterior Patient sedated: Yes. Refer to sedation procedure documentation for details of  sedation.  Attempt one:    Cardioversion mode:  Synchronous   Waveform:  Biphasic   Shock (Joules):  100   Shock outcome:  Conversion to normal sinus rhythm Post-procedure details:    Patient status:  Awake   Patient tolerance of procedure:  Tolerated well, no immediate complications   (including critical care time)  Medications Ordered in ED Medications  propofol (DIPRIVAN) 10 mg/mL bolus/IV push 50.4 mg (50.4 mg Intravenous Not Given 12/06/17 0201)  propofol (DIPRIVAN) 10 mg/mL bolus/IV push (50 mg Intravenous Given 12/06/17 0156)  0.9 %  sodium chloride infusion (1,000 mLs Intravenous New Bag/Given 12/06/17 0156)     Initial Impression / Assessment and Plan / ED Course  I have reviewed the triage vital signs and the nursing notes.  Pertinent labs & imaging results that were available during my care of the patient were reviewed by me and considered in my medical decision making (see chart for details).    Acute onset of A. fib with RVR history of similar presentation.  Requesting cardioversion.  Palpitations have been ongoing for the past 2 hours.  He has been compliant with his Xarelto.  Risks and benefits of cardioversion discussed with patient he agrees to proceed.  Patient converted to sinus rhythm with cardioversion as above.  Labs are reassuring.  Patient does not have a ride home after sedation.  Discussed that Kirk states he cannot drive himself home after receiving sedation.  He states he cannot call his wife who is home with an autistic child.  He insists on leaving and appears to have capacity to make medical decisions.  Discussed he will need to leave [place] as it is not recommended that patient drives himself home.  CHA2DS2/VAS Stroke Risk Points  Current as of 4 minutes ago     3 >= 2 Points: High Risk  1 - 1.99 Points: Medium Risk  0 Points: Low Risk    The patient's score has not changed in the past year.:  No Change     Details      This score determines the patient's risk  of having a stroke if the  patient has atrial fibrillation.       Points Metrics  0 Has Congestive Heart Failure:  No    Current as of 4 minutes ago  1 Has Vascular Disease:  Yes    Current as of 4 minutes ago  1 Has Hypertension:  Yes    Current as of 4 minutes ago  0 Age:  89    Current as of 4 minutes ago  1 Has Diabetes:  Yes    Current as of 4 minutes ago  0 Had Stroke:  No  Had TIA:  No  Had thromboembolism:  No    Current as of 4 minutes ago  0 Male:  No    Current as of 4 minutes ago            CRITICAL CARE Performed by: Ezequiel Essex Total critical care time: 32 minutes Critical care time was exclusive of separately billable procedures and treating other patients. Critical care was necessary to treat or prevent imminent or life-threatening deterioration. Critical care was time spent personally by me on the following activities: development of treatment plan with patient and/or surrogate as well as nursing, discussions with consultants, evaluation of patient's response to treatment, examination of patient, obtaining history from patient or surrogate, ordering and performing treatments and interventions, ordering and review of laboratory studies, ordering and review of radiographic studies, pulse oximetry and re-evaluation of patient's condition.  Final Clinical Impressions(s) / ED Diagnoses   Final diagnoses:  Atrial fibrillation with RVR [facility])    ED Discharge Orders    None       Ezequiel Essex, MD 12/06/17 [phone]

## 2017-12-06 NOTE — ED Notes (Signed)
Patient called and stated he was safely home.

## 2017-12-06 NOTE — ED Triage Notes (Signed)
Pt states that he woke up around 1240 woke up in afib RVR, hx of the same, took Cardizem at home without relief. Denies CP, some SOB, last time required cardioversion

## 2017-12-07 ENCOUNTER — Telehealth: Payer: Self-pay | Admitting: Cardiovascular Disease

## 2017-12-07 NOTE — Telephone Encounter (Signed)
Left message to call back  

## 2017-12-07 NOTE — Telephone Encounter (Signed)
Follow up   Pt returning call to nurse.

## 2017-12-07 NOTE — Telephone Encounter (Signed)
Spoke with pt who report having pressure in his chest and shoulder blade. He reports when he is outside he also having some SOB. Pt advised to go to the ED for further evaluations. Pt reports he doesn't want to go to ED and would rather be seen in office. Pt advised that our policy is that active CP report to ER for further workup. Pt states, "I will go if symptoms get worse but I don't feel like Im dying right now." Pt again highly encouraged to report to ED.

## 2017-12-07 NOTE — Telephone Encounter (Signed)
Patient c/o Palpitations:  High priority if patient c/o lightheadedness, shortness of breath, or chest pain  1) How long have you had palpitations/irregular HR/ Afib? Are you having the symptoms now? Not in afib now  2) Are you currently experiencing lightheadedness, SOB or CP? No  3) Do you have a history of afib (atrial fibrillation) or irregular heart rhythm? Yes  4) Have you checked your BP or HR? (document readings if available): No  5) Are you experiencing any other symptoms? None

## 2017-12-08 ENCOUNTER — Institutional Professional Consult (permissible substitution): Payer: Self-pay | Admitting: Pulmonary Disease

## 2017-12-10 NOTE — H&P (View-Only) (Signed)
Cardiology Office Note:    Date:  12/11/2017   ID:  [name], DOB 03-23-1961, MRN [id number]  PCP:  Eulas Post, MD  Cardiologist:  Shelva Majestic, MD   Referring MD: Eulas Post, MD   Chief Complaint  Patient presents with  . Hospitalization Follow-up    DCCV in ER for Afib    History of Present Illness:    Michael Livingston is a 57 y.o. male with a hx of CAD and in October 2005 underwent stenting to his proximal to mid left anterior descending artery, last catheterization (02/2006) with patent LAD, 30% circumflex stenosis, nuclear perfusion study in January 2013 continued to show normal perfusion. He also has paroxysmal Afib RVR, pulmonary sarcoidosis, DM, and HLD.  He was initially diagnosed with A. fib on 01/19/2017, and underwent cardioversion to normal sinus rhythm. Plavix was discontinued in the setting of anticoagulation.  Due to some work related injuries and his sustaining a normal sinus rhythm, decision was made to take him off of anticoagulation.  Unfortunately he returned to A. fib RVR on 10/10/2016.  He was cardioverted in the ER and resumed anticoagulation. He last saw Dr. Claiborne Billings in clinic on 10/18/2017 and was still in normal sinus rhythm. Recent echo with normal LVEF and grade 2 diastolic dysfunction.  Nuclear stress test was low risk.  He presented to the ER on 12/06/2017 and found to be in A. fib RVR.  He woke from sleep with palpitations.  He came to the ER requesting cardioversion.  He tried PRN Cardizem without relief of his palpitations.  He was compliant with his Xarelto and was therefore cardioverted in the ER to sinus rhythm.  Unfortunately he did not have a driver following sedation and left AMA  He returns to clinic today for follow-up after cardioversion in the ER. He reports that for the preceding 1-2 months, he has been experiencing DOE and chest fullness/tightness with minimal work. 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 He is  generally physically active. He no longer smokes and is controlling his DM - last A1c was 6.9%. Last cath in 2007 showed generally patent LAD stents with 20% in-stent restenosis, but 30-40% narrowing at the proximal bend of the LCx. After his DCCV in the ER, his DOE and chest fullness/soreness has not changed. 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 He also states that these symptoms are very similar to his symptoms that led to his first heart cath that resulted in PCI.    Past Medical History:  Diagnosis Date  . Allergy   . Atrial fibrillation ([place])    a. diagnosed in 01/2017 --> s/p DCCV. Started on Xarelto for anticoagulation.   Marland Kitchen CAD (coronary artery disease)    a. s/p DES to LAD in 2005 b. patent by cath in 2007 with 30% LCx stenosis noted at that time c. normal NST in 05/2011  . Chronic kidney disease    1 kidney stone  . Diabetes mellitus without complication (HCC)    type 2, no meds needed  . Hyperlipidemia   . Hypertension   . Myocardial infarction [facility])     Past Surgical History:  Procedure Laterality Date  . CARDIAC SURGERY  2005   stent  . CORONARY STENT PLACEMENT    . KNEE ARTHROSCOPY     left 2003, right 2005  . SHOULDER SURGERY      Current Medications: Current Meds  Medication Sig  . diltiazem (CARDIZEM) 30 MG tablet TAKE 1 TABLET (30 MG TOTAL) BY MOUTH DAILY AS NEEDED.  Marland Kitchen ezetimibe-simvastatin (VYTORIN) 10-40 MG tablet Take 1 tablet by mouth daily. (Patient taking differently: Take 1 tablet by mouth at bedtime. )  . famotidine (PEPCID) 20 MG tablet One at bedtime  . fluticasone (FLONASE) 50 MCG/ACT nasal spray Place 1 spray into both nostrils daily. (Patient taking differently: Place 1 spray into both nostrils daily as needed for allergies or rhinitis. )  . metFORMIN (GLUCOPHAGE XR) 750 MG 24 hr tablet Take 1 tablet (750 mg total) by mouth daily with breakfast. (Patient taking differently: Take 750 mg by mouth at bedtime. )  .  metoprolol succinate (TOPROL XL) 25 MG 24 hr tablet Take 1.5 tablets (37.5 mg total) by mouth daily.  . Multiple Vitamins-Minerals (MULTIVITAMIN WITH MINERALS) tablet Take 1 tablet by mouth at bedtime.   . nitroGLYCERIN (NITROSTAT) 0.4 MG SL tablet Place 1 tablet (0.4 mg total) under the tongue every 5 (five) minutes as needed for chest pain.  . pantoprazole (PROTONIX) 40 MG tablet Take 1 tablet (40 mg total) by mouth daily. Take 30-60 min before first meal of the day  . PAZEO 0.7 % SOLN INSTILL 1 DROP INTO INTO BOTH EYES EVERY MORNING AS NEEDED FOR ITCHING  . rivaroxaban (XARELTO) 20 MG TABS tablet Take 1 tablet (20 mg total) by mouth daily with supper.     Allergies:   Aleve [naproxen sodium]; Oxycodone; Sulfa antibiotics; and Vicodin [hydrocodone-acetaminophen]   Social History   Socioeconomic History  . Marital status: Married    Spouse name: Not on file  . Number of children: Not on file  . Years of education: Not on file  . Highest education level: Not on file  Occupational History  . Not on file  Social Needs  . Financial resource strain: Not on file  . Food insecurity:    Worry: Not on file    Inability: Not on file  . Transportation needs:    Medical: Not on file    Non-medical: Not on file  Tobacco Use  . Smoking status: Former Smoker    Packs/day: 1.00    Years: 8.00    Pack years: 8.00    Last attempt to quit: 07/17/1987    Years since quitting: 30.4  . Smokeless tobacco: Never Used  Substance and Sexual Activity  . Alcohol use: No    Alcohol/week: 0.0 oz  . Drug use: No  . Sexual activity: Not on file  Lifestyle  . Physical activity:    Days per week: Not on file    Minutes per session: Not on file  . Stress: Not on file  Relationships  . Social connections:    Talks on phone: Not on file    Gets together: Not on file    Attends religious service: Not on file    Active member of club or organization: Not on file    Attends meetings of clubs or  organizations: Not on file    Relationship status: Not on file  Other Topics Concern  . Not on file  Social History Narrative  . Not on file     Family History: The patient's family history includes Diabetes in his father and mother; Heart disease in his father and paternal grandfather. There is no history of Colon cancer, Esophageal cancer, Stomach cancer, or Rectal cancer.  ROS:   Please see the history of present illness.  All other systems reviewed and are negative.  EKGs/Labs/Other Studies Reviewed:    The following studies were reviewed today:  Myoview 11/03/17:  The patient walked on a standard Bruce protocol treadmill test for a total of 12 minutes and 30 seconds. He achieved a peak heart rate of 150 which is 90% predicted maximal heart rate.  At peak exercise he had no ST or T wave changes to suggest ischemia.  He had a hypertensive response to exercise.  Nuclear stress EF: 45%. The left ventricular ejection fraction is mildly decreased (45-54%). visually the EF appears normal. no wall motion abn.  The study is normal.  This is a low risk study.  There was no ST segment deviation noted during stress.   Echo 11/03/17: Study Conclusions - Left ventricle: The cavity size was normal. Wall thickness was   increased in a pattern of mild LVH. Systolic function was normal.   The estimated ejection fraction was in the range of 55% to 65%.   Features are consistent with a pseudonormal left ventricular   filling pattern, with concomitant abnormal relaxation and   increased filling pressure (grade 2 diastolic dysfunction). - Right atrium: The atrium was mildly dilated.   EKG:  EKG is ordered today.  The ekg ordered today demonstrates sinus rhythm.   Recent Labs: 10/10/2017: ALT 18; Magnesium 2.2 12/05/2017: Pro B Natriuretic peptide (BNP) 15.0; TSH 1.71 12/06/2017: BUN 16; Creatinine, Ser 1.08; Hemoglobin 14.9; Platelets 199; Potassium 3.8; Sodium 140  Recent Lipid  Panel    Component Value Date/Time   CHOL 105 08/01/2017 0813   CHOL 99 04/02/2013 0903   TRIG 94 08/01/2017 0813   TRIG 59 04/02/2013 0903   HDL 37 (L) 08/01/2017 0813   HDL 42 04/02/2013 0903   CHOLHDL 2.8 08/01/2017 0813   CHOLHDL 2.9 06/30/2016 1117   VLDL 17 06/30/2016 1117   LDLCALC 49 08/01/2017 0813   LDLCALC 45 04/02/2013 0903    Physical Exam:    VS:  BP 122/78   Pulse 69   Ht 6\' 2"  (1.88 m)   Wt 221 lb 9.6 oz (100.5 kg)   BMI 28.45 kg/m     Wt Readings from Last 3 Encounters:  12/11/17 221 lb 9.6 oz (100.5 kg)  12/06/17 222 lb (100.7 kg)  12/05/17 222 lb 9.6 oz (101 kg)     GEN: Well nourished, well developed in no acute distress HEENT: Normal NECK: No JVD; No carotid bruits CARDIAC: RRR, no murmurs, rubs, gallops RESPIRATORY:  Clear to auscultation without rales, wheezing or rhonchi  ABDOMEN: Soft, non-tender, non-distended MUSCULOSKELETAL:  No edema; No deformity  SKIN: Warm and dry NEUROLOGIC:  Alert and oriented x 3 PSYCHIATRIC:  Normal affect   ASSESSMENT:    1. Unstable angina ([place])   2. Atherosclerosis of native coronary artery of native heart without angina pectoris   3. Paroxysmal atrial fibrillation (HCC)   4. Pulmonary sarcoidosis ([place])   5. Type 2 diabetes mellitus without complication, without long-term current use of insulin ([place])   6. Hyperlipidemia with target LDL less than 70    PLAN:    In order of problems listed above:  Unstable angina ([place]) - Plan: EKG 12-Lead, LEFT HEART CATH Atherosclerosis of native coronary artery of native heart without angina pectoris CAD s/p stenting to the LAD 2006 Last cath in 2007 with patent stent and 30% in-stent restenosis. He also had 30% stenosis in proximal bend of left Cx. It sounds as though this DOE and chest  fullness are his anginal equivalent. He had a reassuring myoview stress test in June 2019. However, given his persistent symptoms, known disease, and overall clinical picture, I think a  repeat heart cath is reasonable.  The patient understands that risks included but are not limited to stroke (1 in 1000), death (1 in 19), kidney failure [usually temporary] (1 in 500), bleeding (1 in 200), allergic reaction [possibly serious] (1 in 200).  The patient understands and agrees to proceed.  He will hold xarelto for 2 days.   Paroxysmal atrial fibrillation (HCC) s/p DCCV in ER 12/06/17 On xarelto, toprol 37.5 mg daily, PRN cardizem 30 mg. He is in NSR today. No palpitations. This patients CHA2DS2-VASc Score and unadjusted Ischemic Stroke Rate (% per year) is equal to 4.8 % stroke rate/year from a score of 4 (CHF, DM, CAD, HTN).    Hyperlipidemia with target LDL less than 70 08/01/2017: Cholesterol, Total 105; HDL 37; LDL Calculated 49; Triglycerides 94  Continue statin.    Pulmonary sarcoidosis [facility]) He has recently seen pulmonology and they did not think his chest fullness was related to his sarcoidosis. We discussed that if his cath was normal, we may consider a cardiac MRI to look for possible sarcoidosis in the heart.    Type 2 diabetes mellitus without complication, without long-term current use of insulin (HCC) Last A1c was 6.9%. Per primary.   Medication Adjustments/Labs and Tests Ordered: Current medicines are reviewed at length with the patient today.  Concerns regarding medicines are outlined above.  Orders Placed This Encounter  Procedures  . EKG 12-Lead  . LEFT HEART CATH   No orders of the defined types were placed in this encounter.   Signed, Ledora Bottcher, PA  12/11/2017 12:44 PM    [facility]

## 2017-12-10 NOTE — Progress Notes (Signed)
Cardiology Office Note:    Date:  12/11/2017   ID:  [name], DOB [birth year], MRN [id number]  PCP:  Eulas Post, MD  Cardiologist:  Shelva Majestic, MD   Referring MD: Eulas Post, MD   Chief Complaint  Patient presents with  . Hospitalization Follow-up    DCCV in ER for Afib    History of Present Illness:    Michael Livingston is a 57 y.o. male with a hx of CAD and in October 2005 underwent stenting to his proximal to mid left anterior descending artery, last catheterization (02/2006) with patent LAD, 30% circumflex stenosis, nuclear perfusion study in January 2013 continued to show normal perfusion. He also has paroxysmal Afib RVR, pulmonary sarcoidosis, DM, and HLD.  He was initially diagnosed with A. fib on 01/19/2017, and underwent cardioversion to normal sinus rhythm. Plavix was discontinued in the setting of anticoagulation.  Due to some work related injuries and his sustaining a normal sinus rhythm, decision was made to take him off of anticoagulation.  Unfortunately he returned to A. fib RVR on 10/10/2016.  He was cardioverted in the ER and resumed anticoagulation. He last saw Dr. Claiborne Billings in clinic on 10/18/2017 and was still in normal sinus rhythm. Recent echo with normal LVEF and grade 2 diastolic dysfunction.  Nuclear stress test was low risk.  He presented to the ER on 12/06/2017 and found to be in A. fib RVR.  He woke from sleep with palpitations.  He came to the ER requesting cardioversion.  He tried PRN Cardizem without relief of his palpitations.  He was compliant with his Xarelto and was therefore cardioverted in the ER to sinus rhythm.  Unfortunately he did not have a driver following sedation and left AMA  He returns to clinic today for follow-up after cardioversion in the ER. He reports that for the preceding 1-2 months, he has been experiencing DOE and chest fullness/tightness with minimal work. 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 He is  generally physically active. He no longer smokes and is controlling his DM - last A1c was 6.9%. Last cath in 2007 showed generally patent LAD stents with 20% in-stent restenosis, but 30-40% narrowing at the proximal bend of the LCx. After his DCCV in the ER, his DOE and chest fullness/soreness has not changed. 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 He also states that these symptoms are very similar to his symptoms that led to his first heart cath that resulted in PCI.    Past Medical History:  Diagnosis Date  . Allergy   . Atrial fibrillation ([place])    a. diagnosed in 01/2017 --> s/p DCCV. Started on Xarelto for anticoagulation.   Marland Kitchen CAD (coronary artery disease)    a. s/p DES to LAD in 2005 b. patent by cath in 2007 with 30% LCx stenosis noted at that time c. normal NST in 05/2011  . Chronic kidney disease    1 kidney stone  . Diabetes mellitus without complication (HCC)    type 2, no meds needed  . Hyperlipidemia   . Hypertension   . Myocardial infarction [facility])     Past Surgical History:  Procedure Laterality Date  . CARDIAC SURGERY  2005   stent  . CORONARY STENT PLACEMENT    . KNEE ARTHROSCOPY     left 2003, right 2005  . SHOULDER SURGERY      Current Medications: Current Meds  Medication Sig  . diltiazem (CARDIZEM) 30 MG tablet TAKE 1 TABLET (30 MG TOTAL) BY MOUTH DAILY AS NEEDED.  Marland Kitchen ezetimibe-simvastatin (VYTORIN) 10-40 MG tablet Take 1 tablet by mouth daily. (Patient taking differently: Take 1 tablet by mouth at bedtime. )  . famotidine (PEPCID) 20 MG tablet One at bedtime  . fluticasone (FLONASE) 50 MCG/ACT nasal spray Place 1 spray into both nostrils daily. (Patient taking differently: Place 1 spray into both nostrils daily as needed for allergies or rhinitis. )  . metFORMIN (GLUCOPHAGE XR) 750 MG 24 hr tablet Take 1 tablet (750 mg total) by mouth daily with breakfast. (Patient taking differently: Take 750 mg by mouth at bedtime. )  .  metoprolol succinate (TOPROL XL) 25 MG 24 hr tablet Take 1.5 tablets (37.5 mg total) by mouth daily.  . Multiple Vitamins-Minerals (MULTIVITAMIN WITH MINERALS) tablet Take 1 tablet by mouth at bedtime.   . nitroGLYCERIN (NITROSTAT) 0.4 MG SL tablet Place 1 tablet (0.4 mg total) under the tongue every 5 (five) minutes as needed for chest pain.  . pantoprazole (PROTONIX) 40 MG tablet Take 1 tablet (40 mg total) by mouth daily. Take 30-60 min before first meal of the day  . PAZEO 0.7 % SOLN INSTILL 1 DROP INTO INTO BOTH EYES EVERY MORNING AS NEEDED FOR ITCHING  . rivaroxaban (XARELTO) 20 MG TABS tablet Take 1 tablet (20 mg total) by mouth daily with supper.     Allergies:   Aleve [naproxen sodium]; Oxycodone; Sulfa antibiotics; and Vicodin [hydrocodone-acetaminophen]   Social History   Socioeconomic History  . Marital status: Married    Spouse name: Not on file  . Number of children: Not on file  . Years of education: Not on file  . Highest education level: Not on file  Occupational History  . Not on file  Social Needs  . Financial resource strain: Not on file  . Food insecurity:    Worry: Not on file    Inability: Not on file  . Transportation needs:    Medical: Not on file    Non-medical: Not on file  Tobacco Use  . Smoking status: Former Smoker    Packs/day: 1.00    Years: 8.00    Pack years: 8.00    Last attempt to quit: 07/17/1987    Years since quitting: 30.4  . Smokeless tobacco: Never Used  Substance and Sexual Activity  . Alcohol use: No    Alcohol/week: 0.0 oz  . Drug use: No  . Sexual activity: Not on file  Lifestyle  . Physical activity:    Days per week: Not on file    Minutes per session: Not on file  . Stress: Not on file  Relationships  . Social connections:    Talks on phone: Not on file    Gets together: Not on file    Attends religious service: Not on file    Active member of club or organization: Not on file    Attends meetings of clubs or  organizations: Not on file    Relationship status: Not on file  Other Topics Concern  . Not on file  Social History Narrative  . Not on file     Family History: The patient's family history includes Diabetes in his father and mother; Heart disease in his father and paternal grandfather. There is no history of Colon cancer, Esophageal cancer, Stomach cancer, or Rectal cancer.  ROS:   Please see the history of present illness.  All other systems reviewed and are negative.  EKGs/Labs/Other Studies Reviewed:    The following studies were reviewed today:  Myoview 11/03/17:  The patient walked on a standard Bruce protocol treadmill test for a total of 12 minutes and 30 seconds. He achieved a peak heart rate of 150 which is 90% predicted maximal heart rate.  At peak exercise he had no ST or T wave changes to suggest ischemia.  He had a hypertensive response to exercise.  Nuclear stress EF: 45%. The left ventricular ejection fraction is mildly decreased (45-54%). visually the EF appears normal. no wall motion abn.  The study is normal.  This is a low risk study.  There was no ST segment deviation noted during stress.   Echo 11/03/17: Study Conclusions - Left ventricle: The cavity size was normal. Wall thickness was   increased in a pattern of mild LVH. Systolic function was normal.   The estimated ejection fraction was in the range of 55% to 65%.   Features are consistent with a pseudonormal left ventricular   filling pattern, with concomitant abnormal relaxation and   increased filling pressure (grade 2 diastolic dysfunction). - Right atrium: The atrium was mildly dilated.   EKG:  EKG is ordered today.  The ekg ordered today demonstrates sinus rhythm.   Recent Labs: 10/10/2017: ALT 18; Magnesium 2.2 12/05/2017: Pro B Natriuretic peptide (BNP) 15.0; TSH 1.71 12/06/2017: BUN 16; Creatinine, Ser 1.08; Hemoglobin 14.9; Platelets 199; Potassium 3.8; Sodium 140  Recent Lipid  Panel    Component Value Date/Time   CHOL 105 08/01/2017 0813   CHOL 99 04/02/2013 0903   TRIG 94 08/01/2017 0813   TRIG 59 04/02/2013 0903   HDL 37 (L) 08/01/2017 0813   HDL 42 04/02/2013 0903   CHOLHDL 2.8 08/01/2017 0813   CHOLHDL 2.9 06/30/2016 1117   VLDL 17 06/30/2016 1117   LDLCALC 49 08/01/2017 0813   LDLCALC 45 04/02/2013 0903    Physical Exam:    VS:  BP 122/78   Pulse 69   Ht 6\' 2"  (1.88 m)   Wt 221 lb 9.6 oz (100.5 kg)   BMI 28.45 kg/m     Wt Readings from Last 3 Encounters:  12/11/17 221 lb 9.6 oz (100.5 kg)  12/06/17 222 lb (100.7 kg)  12/05/17 222 lb 9.6 oz (101 kg)     GEN: Well nourished, well developed in no acute distress HEENT: Normal NECK: No JVD; No carotid bruits CARDIAC: RRR, no murmurs, rubs, gallops RESPIRATORY:  Clear to auscultation without rales, wheezing or rhonchi  ABDOMEN: Soft, non-tender, non-distended MUSCULOSKELETAL:  No edema; No deformity  SKIN: Warm and dry NEUROLOGIC:  Alert and oriented x 3 PSYCHIATRIC:  Normal affect   ASSESSMENT:    1. Unstable angina ([place])   2. Atherosclerosis of native coronary artery of native heart without angina pectoris   3. Paroxysmal atrial fibrillation (HCC)   4. Pulmonary sarcoidosis ([place])   5. Type 2 diabetes mellitus without complication, without long-term current use of insulin ([place])   6. Hyperlipidemia with target LDL less than 70    PLAN:    In order of problems listed above:  Unstable angina ([place]) - Plan: EKG 12-Lead, LEFT HEART CATH Atherosclerosis of native coronary artery of native heart without angina pectoris CAD s/p stenting to the LAD 2006 Last cath in 2007 with patent stent and 30% in-stent restenosis. He also had 30% stenosis in proximal bend of left Cx. It sounds as though this DOE and chest  fullness are his anginal equivalent. He had a reassuring myoview stress test in June 2019. However, given his persistent symptoms, known disease, and overall clinical picture, I think a  repeat heart cath is reasonable.  The patient understands that risks included but are not limited to stroke (1 in 1000), death (1 in 102), kidney failure [usually temporary] (1 in 500), bleeding (1 in 200), allergic reaction [possibly serious] (1 in 200).  The patient understands and agrees to proceed.  He will hold xarelto for 2 days.   Paroxysmal atrial fibrillation (HCC) s/p DCCV in ER 12/06/17 On xarelto, toprol 37.5 mg daily, PRN cardizem 30 mg. He is in NSR today. No palpitations. This patients CHA2DS2-VASc Score and unadjusted Ischemic Stroke Rate (% per year) is equal to 4.8 % stroke rate/year from a score of 4 (CHF, DM, CAD, HTN).    Hyperlipidemia with target LDL less than 70 08/01/2017: Cholesterol, Total 105; HDL 37; LDL Calculated 49; Triglycerides 94  Continue statin.    Pulmonary sarcoidosis [facility]) He has recently seen pulmonology and they did not think his chest fullness was related to his sarcoidosis. We discussed that if his cath was normal, we may consider a cardiac MRI to look for possible sarcoidosis in the heart.    Type 2 diabetes mellitus without complication, without long-term current use of insulin (HCC) Last A1c was 6.9%. Per primary.   Medication Adjustments/Labs and Tests Ordered: Current medicines are reviewed at length with the patient today.  Concerns regarding medicines are outlined above.  Orders Placed This Encounter  Procedures  . EKG 12-Lead  . LEFT HEART CATH   No orders of the defined types were placed in this encounter.   Signed, Ledora Bottcher, PA  12/11/2017 12:44 PM    [facility]

## 2017-12-11 ENCOUNTER — Encounter: Payer: Self-pay | Admitting: Physician Assistant

## 2017-12-11 ENCOUNTER — Other Ambulatory Visit: Payer: Self-pay | Admitting: *Deleted

## 2017-12-11 ENCOUNTER — Ambulatory Visit: Payer: PRIVATE HEALTH INSURANCE | Admitting: Physician Assistant

## 2017-12-11 ENCOUNTER — Institutional Professional Consult (permissible substitution): Payer: Self-pay | Admitting: Pulmonary Disease

## 2017-12-11 VITALS — BP 122/78 | HR 69 | Ht 74.0 in | Wt 221.6 lb

## 2017-12-11 DIAGNOSIS — I200 Unstable angina: Secondary | ICD-10-CM

## 2017-12-11 DIAGNOSIS — I2510 Atherosclerotic heart disease of native coronary artery without angina pectoris: Secondary | ICD-10-CM | POA: Diagnosis not present

## 2017-12-11 DIAGNOSIS — E785 Hyperlipidemia, unspecified: Secondary | ICD-10-CM

## 2017-12-11 DIAGNOSIS — E119 Type 2 diabetes mellitus without complications: Secondary | ICD-10-CM

## 2017-12-11 DIAGNOSIS — D860 Sarcoidosis of lung: Secondary | ICD-10-CM | POA: Diagnosis not present

## 2017-12-11 DIAGNOSIS — I480 Paroxysmal atrial fibrillation: Secondary | ICD-10-CM | POA: Diagnosis not present

## 2017-12-11 NOTE — Patient Instructions (Addendum)
It is recommended that you schedule a follow-up appointment about 3 weeks after heart cath with Angie [place] [address] [place] [address] Dept: [phone] Loc: [place]  12/11/2017  You are scheduled for a Cardiac Catheterization on Friday August 9 with Dr. Shelva Majestic.  1. Please arrive at the [facility] (Main Entrance A) at [facility]: [address] [place], [place] [address] at 5:30am (This time is two hours before your procedure to ensure your preparation). Free valet parking service is available.   Special note: Every effort is made to have your procedure done on time. Please understand that emergencies sometimes delay scheduled procedures.  2. Diet: Do not eat solid foods after midnight.  You may have clear liquids until 5am upon the day of the procedure.  3. Labs: NONE needed  4. Medication instructions in preparation for your procedure:  HOLD xarelto 48 hours prior to procedure - none Wednesday, none Thursday  HOLD metformin the day of procedure and then 48 hours after procedure  On the morning of your procedure, take Aspirin 81mg  and any morning medicines NOT listed above.  You may use sips of water.  5. Plan for one night stay--bring personal belongings. 6. Bring a current list of your medications and current insurance cards. 7. You MUST have a responsible person to drive you home. 8. Someone MUST be with you the first 24 hours after you arrive home or your discharge will be delayed. 9. Please wear clothes that are easy to get on and off and wear slip-on shoes.  Thank you for allowing [place] to care for you!   -- [facility]

## 2017-12-14 ENCOUNTER — Telehealth: Payer: Self-pay | Admitting: *Deleted

## 2017-12-14 NOTE — Telephone Encounter (Signed)
Pt contacted pre-catheterization scheduled at [facility] for: Friday December 15, 2017 7:30 AM Verified arrival time and place: [place] Entrance A at: 5:30 AM  No solid food after midnight prior to cath, clear liquids until 5 AM day of procedure. Verified allergies in Epic   Hold: Xarelto-hold 12/13/17 until post procedure. Metformin -AM of procedure and 48 hours post procedure.  Except hold medications AM meds can be  taken pre-cath with sip of water including: ASA 81 mg  Confirmed patient has responsible person to drive home post procedure and for 24 hours after you arrive home: yes

## 2017-12-15 ENCOUNTER — Encounter (HOSPITAL_COMMUNITY)
Admission: RE | Disposition: A | Discharge status: Home / Self Care | Payer: Self-pay | Source: Ambulatory Visit | Attending: Cardiovascular Disease

## 2017-12-15 ENCOUNTER — Encounter (HOSPITAL_COMMUNITY): Payer: Self-pay | Admitting: Cardiovascular Disease

## 2017-12-15 ENCOUNTER — Ambulatory Visit (HOSPITAL_COMMUNITY)
Admission: RE | Admit: 2017-12-15 | Discharge: 2017-12-15 | Disposition: A | Discharge status: Home / Self Care | Payer: BLUE CROSS/BLUE SHIELD | Source: Ambulatory Visit | Attending: Cardiovascular Disease | Admitting: Cardiovascular Disease

## 2017-12-15 ENCOUNTER — Other Ambulatory Visit: Payer: Self-pay

## 2017-12-15 DIAGNOSIS — E785 Hyperlipidemia, unspecified: Secondary | ICD-10-CM | POA: Insufficient documentation

## 2017-12-15 DIAGNOSIS — I480 Paroxysmal atrial fibrillation: Secondary | ICD-10-CM | POA: Insufficient documentation

## 2017-12-15 DIAGNOSIS — Z7901 Long term (current) use of anticoagulants: Secondary | ICD-10-CM | POA: Diagnosis not present

## 2017-12-15 DIAGNOSIS — I200 Unstable angina: Secondary | ICD-10-CM

## 2017-12-15 DIAGNOSIS — Z885 Allergy status to narcotic agent status: Secondary | ICD-10-CM | POA: Insufficient documentation

## 2017-12-15 DIAGNOSIS — Z882 Allergy status to sulfonamides status: Secondary | ICD-10-CM | POA: Insufficient documentation

## 2017-12-15 DIAGNOSIS — I129 Hypertensive chronic kidney disease with stage 1 through stage 4 chronic kidney disease, or unspecified chronic kidney disease: Secondary | ICD-10-CM | POA: Insufficient documentation

## 2017-12-15 DIAGNOSIS — Z7902 Long term (current) use of antithrombotics/antiplatelets: Secondary | ICD-10-CM | POA: Insufficient documentation

## 2017-12-15 DIAGNOSIS — I25119 Atherosclerotic heart disease of native coronary artery with unspecified angina pectoris: Secondary | ICD-10-CM | POA: Diagnosis not present

## 2017-12-15 DIAGNOSIS — N189 Chronic kidney disease, unspecified: Secondary | ICD-10-CM | POA: Insufficient documentation

## 2017-12-15 DIAGNOSIS — E1122 Type 2 diabetes mellitus with diabetic chronic kidney disease: Secondary | ICD-10-CM | POA: Insufficient documentation

## 2017-12-15 DIAGNOSIS — D860 Sarcoidosis of lung: Secondary | ICD-10-CM | POA: Insufficient documentation

## 2017-12-15 DIAGNOSIS — Z955 Presence of coronary angioplasty implant and graft: Secondary | ICD-10-CM | POA: Insufficient documentation

## 2017-12-15 DIAGNOSIS — I2510 Atherosclerotic heart disease of native coronary artery without angina pectoris: Secondary | ICD-10-CM

## 2017-12-15 DIAGNOSIS — Z7984 Long term (current) use of oral hypoglycemic drugs: Secondary | ICD-10-CM | POA: Insufficient documentation

## 2017-12-15 DIAGNOSIS — Z87891 Personal history of nicotine dependence: Secondary | ICD-10-CM | POA: Diagnosis not present

## 2017-12-15 DIAGNOSIS — Z7951 Long term (current) use of inhaled steroids: Secondary | ICD-10-CM | POA: Insufficient documentation

## 2017-12-15 DIAGNOSIS — I25110 Atherosclerotic heart disease of native coronary artery with unstable angina pectoris: Secondary | ICD-10-CM | POA: Diagnosis not present

## 2017-12-15 DIAGNOSIS — I252 Old myocardial infarction: Secondary | ICD-10-CM | POA: Diagnosis not present

## 2017-12-15 HISTORY — PX: LEFT HEART CATH AND CORONARY ANGIOGRAPHY: CATH118249

## 2017-12-15 LAB — GLUCOSE, CAPILLARY: GLUCOSE-CAPILLARY: 129 mg/dL — AB (ref 70–99)

## 2017-12-15 SURGERY — LEFT HEART CATH AND CORONARY ANGIOGRAPHY
Anesthesia: LOCAL

## 2017-12-15 MED ORDER — MIDAZOLAM HCL 2 MG/2ML IJ SOLN
INTRAMUSCULAR | Status: AC
  Filled 2017-12-15: qty 2

## 2017-12-15 MED ORDER — SODIUM CHLORIDE 0.9% FLUSH: 3.0000 mL | INTRAVENOUS | Status: DC | PRN

## 2017-12-15 MED ORDER — SODIUM CHLORIDE 0.9 % IV SOLN
INTRAVENOUS | Status: DC
  Administered 2017-12-15: 07:00:00 via INTRAVENOUS

## 2017-12-15 MED ORDER — MIDAZOLAM HCL 2 MG/2ML IJ SOLN
INTRAMUSCULAR | Status: DC | PRN
  Administered 2017-12-15: 2 mg via INTRAVENOUS

## 2017-12-15 MED ORDER — HEPARIN (PORCINE) IN NACL 2-0.9 UNITS/ML
INTRAMUSCULAR | Status: DC | PRN
  Administered 2017-12-15: 10 mL via INTRA_ARTERIAL

## 2017-12-15 MED ORDER — LIDOCAINE HCL (PF) 1 % IJ SOLN
INTRAMUSCULAR | Status: DC | PRN
  Administered 2017-12-15: 3 mL

## 2017-12-15 MED ORDER — VERAPAMIL HCL 2.5 MG/ML IV SOLN
INTRAVENOUS | Status: AC
  Filled 2017-12-15: qty 2

## 2017-12-15 MED ORDER — FENTANYL CITRATE (PF) 100 MCG/2ML IJ SOLN
INTRAMUSCULAR | Status: DC | PRN
  Administered 2017-12-15: 25 ug via INTRAVENOUS

## 2017-12-15 MED ORDER — ONDANSETRON HCL 4 MG/2ML IJ SOLN
4.0000 mg | Freq: Four times a day (QID) | INTRAMUSCULAR | Status: DC | PRN
Start: 2017-12-15 — End: 2017-12-15

## 2017-12-15 MED ORDER — HEPARIN SODIUM (PORCINE) 1000 UNIT/ML IJ SOLN
INTRAMUSCULAR | Status: DC | PRN
  Administered 2017-12-15: 5000 [IU] via INTRAVENOUS

## 2017-12-15 MED ORDER — ASPIRIN 81 MG PO CHEW: 81.0000 mg | CHEWABLE_TABLET | ORAL | Status: DC

## 2017-12-15 MED ORDER — FENTANYL CITRATE (PF) 100 MCG/2ML IJ SOLN
INTRAMUSCULAR | Status: AC
  Filled 2017-12-15: qty 2

## 2017-12-15 MED ORDER — ACETAMINOPHEN 325 MG PO TABS: 650.0000 mg | ORAL_TABLET | ORAL | Status: DC | PRN

## 2017-12-15 MED ORDER — IOHEXOL 350 MG/ML SOLN
INTRAVENOUS | Status: DC | PRN
  Administered 2017-12-15: 75 mL via INTRAVENOUS

## 2017-12-15 MED ORDER — HEPARIN (PORCINE) IN NACL 1000-0.9 UT/500ML-% IV SOLN
INTRAVENOUS | Status: DC | PRN
  Administered 2017-12-15 (×2): 500 mL

## 2017-12-15 MED ORDER — SODIUM CHLORIDE 0.9% FLUSH: 3.0000 mL | Freq: Two times a day (BID) | INTRAVENOUS | Status: DC

## 2017-12-15 MED ORDER — SODIUM CHLORIDE 0.9 % IV SOLN: INTRAVENOUS | Status: DC

## 2017-12-15 MED ORDER — HEPARIN SODIUM (PORCINE) 1000 UNIT/ML IJ SOLN
INTRAMUSCULAR | Status: AC
  Filled 2017-12-15: qty 1

## 2017-12-15 MED ORDER — HEPARIN (PORCINE) IN NACL 1000-0.9 UT/500ML-% IV SOLN
INTRAVENOUS | Status: AC
  Filled 2017-12-15: qty 1000

## 2017-12-15 MED ORDER — LIDOCAINE HCL (PF) 1 % IJ SOLN
INTRAMUSCULAR | Status: AC
  Filled 2017-12-15: qty 30

## 2017-12-15 MED ORDER — SODIUM CHLORIDE 0.9 % IV SOLN: 250.0000 mL | INTRAVENOUS | Status: DC | PRN

## 2017-12-15 SURGICAL SUPPLY — 11 items
CATH INFINITI 5FR ANG PIGTAIL (CATHETERS) ×1 IMPLANT
CATH OPTITORQUE TIG 4.0 5F (CATHETERS) ×1 IMPLANT
DEVICE RAD COMP TR BAND LRG (VASCULAR PRODUCTS) ×1 IMPLANT
GLIDESHEATH SLEND SS 6F .021 (SHEATH) ×1 IMPLANT
GUIDEWIRE INQWIRE 1.5J.035X260 (WIRE) IMPLANT
INQWIRE 1.5J .035X260CM (WIRE) ×2
KIT HEART LEFT (KITS) ×2 IMPLANT
PACK CARDIAC CATHETERIZATION (CUSTOM PROCEDURE TRAY) ×2 IMPLANT
SYR MEDRAD MARK V 150ML (SYRINGE) ×2 IMPLANT
TRANSDUCER W/STOPCOCK (MISCELLANEOUS) ×2 IMPLANT
TUBING CIL FLEX 10 FLL-RA (TUBING) ×2 IMPLANT

## 2017-12-15 NOTE — Discharge Instructions (Signed)
**Note Michael Livingston-identified via Obfuscation** Radial Site Care °Refer to this sheet in the next few weeks. These instructions provide you with information about caring for yourself after your procedure. Your health care provider may also give you more specific instructions. Your treatment has been planned according to current medical practices, but problems sometimes occur. Call your health care provider if you have any problems or questions after your procedure. °What can I expect after the procedure? °After your procedure, it is typical to have the following: °· Bruising at the radial site that usually fades within 1-2 weeks. °· Blood collecting in the tissue (hematoma) that may be painful to the touch. It should usually decrease in size and tenderness within 1-2 weeks. ° °Follow these instructions at home: °· Take medicines only as directed by your health care provider. °· You may shower 24-48 hours after the procedure or as directed by your health care provider. Remove the bandage (dressing) and gently wash the site with plain soap and water. Pat the area dry with a clean towel. Do not rub the site, because this may cause bleeding. °· Do not take baths, swim, or use a hot tub until your health care provider approves. °· Check your insertion site every day for redness, swelling, or drainage. °· Do not apply powder or lotion to the site. °· Do not flex or bend the affected arm for 24 hours or as directed by your health care provider. °· Do not push or pull heavy objects with the affected arm for 24 hours or as directed by your health care provider. °· Do not lift over 10 lb (4.5 kg) for 5 days after your procedure or as directed by your health care provider. °· Ask your health care provider when it is okay to: °? Return to work or school. °? Resume usual physical activities or sports. °? Resume sexual activity. °· Do not drive home if you are discharged the same day as the procedure. Have someone else drive you. °· You may drive 24 hours after the procedure  unless otherwise instructed by your health care provider. °· Do not operate machinery or power tools for 24 hours after the procedure. °· If your procedure was done as an outpatient procedure, which means that you went home the same day as your procedure, a responsible adult should be with you for the first 24 hours after you arrive home. °· Keep all follow-up visits as directed by your health care provider. This is important. °Contact a health care provider if: °· You have a fever. °· You have chills. °· You have increased bleeding from the radial site. Hold pressure on the site. °Get help right away if: °· You have unusual pain at the radial site. °· You have redness, warmth, or swelling at the radial site. °· You have drainage (other than a small amount of blood on the dressing) from the radial site. °· The radial site is bleeding, and the bleeding does not stop after 30 minutes of holding steady pressure on the site. °· Your arm or hand becomes pale, cool, tingly, or numb. °This information is not intended to replace advice given to you by your health care provider. Make sure you discuss any questions you have with your health care provider. °Document Released: 05/28/2010 Document Revised: 10/01/2015 Document Reviewed: 11/11/2013 °Elsevier Interactive Patient Education © 2018 [employer] Inc[employer] ° °

## 2017-12-15 NOTE — Interval H&P Note (Signed)
Cath Lab Visit (complete for each Cath Lab visit)  Clinical Evaluation Leading to the Procedure:   ACS: No.  Non-ACS:    Anginal Classification: CCS III  Anti-ischemic medical therapy: Maximal Therapy (2 or more classes of medications)  Non-Invasive Test Results: No non-invasive testing performed  Prior CABG: No previous CABG      History and Physical Interval Note:  12/15/2017 7:44 AM  Michael Livingston  has presented today for surgery, with the diagnosis of unstable angina  The various methods of treatment have been discussed with the patient and family. After consideration of risks, benefits and other options for treatment, the patient has consented to  Procedure(s): LEFT HEART CATH AND CORONARY ANGIOGRAPHY (N/A) as a surgical intervention .  The patient's history has been reviewed, patient examined, no change in status, stable for surgery.  I have reviewed the patient's chart and labs.  Questions were answered to the patient's satisfaction.     Shelva Majestic

## 2017-12-15 NOTE — Progress Notes (Signed)
No further swelling or bleeding noted at right radial site.  New dressing placed and pt and wife voices understanding of  what to do if any issues with site.

## 2017-12-15 NOTE — Progress Notes (Signed)
Hematoma noted when pt was dressing to go home.  Pressure held for 15 mins. And hematoma compressed.  Aaron Edelman from cath lab in to assess and discussed with Dr. Claiborne Billings.  Will hold pt one hour to observe.  Angie PA in to see pt.

## 2017-12-29 ENCOUNTER — Other Ambulatory Visit: Payer: Self-pay | Admitting: Cardiovascular Disease

## 2018-01-01 NOTE — Progress Notes (Signed)
Cardiology Office Note:    Date:  01/02/2018   ID:  Michael Livingston, DOB [birth year], MRN [id number]  PCP:  Eulas Post, MD  Cardiologist:  Shelva Majestic, MD   Referring MD: Eulas Post, MD   Chief Complaint  Patient presents with  . Hospitalization Follow-up    cath 12/15/17, denies chest pains, not as much SOB as before, denies swelling    History of Present Illness:    Michael Livingston is a 57 y.o. male with a hx of CAD and in October 2005 underwent stenting to his proximal to mid left anterior descending artery, last catheterization (02/2006) with patent LAD, 30% circumflex stenosis, nuclear perfusion study in January 2013 continued to show normal perfusion. He also has paroxysmal Afib RVR, pulmonary sarcoidosis, DM, and HLD.  He was initially diagnosed with A. fib on 01/19/2017, and underwent cardioversion to normal sinus rhythm. Plavix was discontinued in the setting of anticoagulation.  Due to some work related injuries and his sustaining a normal sinus rhythm, decision was made to take him off of anticoagulation.  Unfortunately he returned to A. fib RVR on 10/10/2016.  He was cardioverted in the ER and resumed anticoagulation. He last saw Dr. Claiborne Billings in clinic on 10/18/2017 and was still in normal sinus rhythm. Recent echo with normal LVEF and grade 2 diastolic dysfunction.  Nuclear stress test was low risk.  He presented to [facility], ER on 12/06/2017 and found to be in A. fib RVR.  He has been compliant on his Xarelto and the decision was made to cardiovert him in the ER which was successful.  I saw him in follow-up on 12/11/2016 for follow-up and he reported having dyspnea on exertion and chest tightness with minimal work.  Decision was made to repeat heart catheterization.  This was performed on 12/15/2017 and showed no significant residual CAD with overlapping stents in the proximal to mid LAD.  No intervention was made.  Normal LV function with an ejection fraction of 55 to 60%  was noted.  There were no segmental wall motion abnormalities.  Medical therapy was recommended and EP evaluation will be considered for possible ablation.  He returns today for follow-up.  He has done well since his heart catheterization, and denies palpitations.  He states that he occasionally gets twinges in his chest but these have been largely resolved with his new GI medications, then include Protonix and Pepcid.  He thinks that these medications have improved his chest pain.  He denies shortness of breath, lower extremity swelling, syncope.  He has not gone back to work 100% in his [place].  He is nervous about returning to A. fib.  We discussed options, including referral to EP for consideration of ablation.  He states that he has not gone back into A. fib since he was cardioverted.   Past Medical History:  Diagnosis Date  . Allergy   . Atrial fibrillation ([place])    a. diagnosed in 01/2017 --> s/p DCCV. Started on Xarelto for anticoagulation.   [place] CAD (coronary artery disease)    a. s/p DES to LAD in 2005 b. patent by cath in 2007 with 30% LCx stenosis noted at that time c. normal NST in 05/2011  . Chronic kidney disease    1 kidney stone  . Diabetes mellitus without complication (HCC)    type 2, no meds needed  . Hyperlipidemia   . Hypertension   . Myocardial infarction [facility])     Past  Surgical History:  Procedure Laterality Date  . CARDIAC SURGERY  2005   stent  . CORONARY STENT PLACEMENT    . KNEE ARTHROSCOPY     left 2003, right 2005  . LEFT HEART CATH AND CORONARY ANGIOGRAPHY N/A 12/15/2017   Procedure: LEFT HEART CATH AND CORONARY ANGIOGRAPHY;  Surgeon: Troy Sine, MD;  Location: [facility];  Service: Cardiovascular;  Laterality: N/A;  . SHOULDER SURGERY      Current Medications: Current Meds  Medication Sig  . diltiazem (CARDIZEM) 30 MG tablet Take 1 tablet (30 mg total) by mouth daily as needed.  . ezetimibe-simvastatin (VYTORIN) 10-40 MG tablet  TAKE 1 TABLET BY MOUTH EVERY DAY  . famotidine (PEPCID) 20 MG tablet One at bedtime (Patient taking differently: Take 20 mg by mouth at bedtime. )  . fluticasone (FLONASE) 50 MCG/ACT nasal spray Place 1 spray into both nostrils daily. (Patient taking differently: Place 1 spray into both nostrils daily as needed for allergies or rhinitis. )  . metFORMIN (GLUCOPHAGE XR) 750 MG 24 hr tablet Take 1 tablet (750 mg total) by mouth daily with breakfast. (Patient taking differently: Take 750 mg by mouth at bedtime. )  . metoprolol succinate (TOPROL XL) 25 MG 24 hr tablet Take 1.5 tablets (37.5 mg total) by mouth daily.  . Multiple Vitamins-Minerals (MULTIVITAMIN WITH MINERALS) tablet Take 1 tablet by mouth at bedtime.   . nitroGLYCERIN (NITROSTAT) 0.4 MG SL tablet Place 1 tablet (0.4 mg total) under the tongue every 5 (five) minutes as needed for chest pain.  . pantoprazole (PROTONIX) 40 MG tablet Take 1 tablet (40 mg total) by mouth daily. Take 30-60 min before first meal of the day  . PAZEO 0.7 % SOLN INSTILL 1 DROP INTO INTO BOTH EYES EVERY MORNING AS NEEDED FOR ITCHING  . rivaroxaban (XARELTO) 20 MG TABS tablet Take 1 tablet (20 mg total) by mouth daily with supper.  . [DISCONTINUED] diltiazem (CARDIZEM) 30 MG tablet TAKE 1 TABLET (30 MG TOTAL) BY MOUTH DAILY AS NEEDED. (Patient taking differently: Take 30 mg by mouth daily as needed (for heart). )  . [DISCONTINUED] metoprolol succinate (TOPROL XL) 25 MG 24 hr tablet Take 1.5 tablets (37.5 mg total) by mouth daily.  . [DISCONTINUED] nitroGLYCERIN (NITROSTAT) 0.4 MG SL tablet Place 1 tablet (0.4 mg total) under the tongue every 5 (five) minutes as needed for chest pain.  . [DISCONTINUED] rivaroxaban (XARELTO) 20 MG TABS tablet Take 1 tablet (20 mg total) by mouth daily with supper.     Allergies:   Aleve [naproxen sodium]; Oxycodone; Sulfa antibiotics; and Vicodin [hydrocodone-acetaminophen]   Social History   Socioeconomic History  . Marital status:  Married    Spouse name: Not on file  . Number of children: Not on file  . Years of education: Not on file  . Highest education level: Not on file  Occupational History  . Not on file  Social Needs  . Financial resource strain: Not on file  . Food insecurity:    Worry: Not on file    Inability: Not on file  . Transportation needs:    Medical: Not on file    Non-medical: Not on file  Tobacco Use  . Smoking status: Former Smoker    Packs/day: 1.00    Years: 8.00    Pack years: 8.00    Last attempt to quit: 07/17/1987    Years since quitting: 30.4  . Smokeless tobacco: Never Used  Substance and Sexual Activity  .  Alcohol use: No    Alcohol/week: 0.0 standard drinks  . Drug use: No  . Sexual activity: Not on file  Lifestyle  . Physical activity:    Days per week: Not on file    Minutes per session: Not on file  . Stress: Not on file  Relationships  . Social connections:    Talks on phone: Not on file    Gets together: Not on file    Attends religious service: Not on file    Active member of club or organization: Not on file    Attends meetings of clubs or organizations: Not on file    Relationship status: Not on file  Other Topics Concern  . Not on file  Social History Narrative  . Not on file     Family History: The patient's family history includes Diabetes in his father and mother; Heart disease in his father and paternal grandfather. There is no history of Colon cancer, Esophageal cancer, Stomach cancer, or Rectal cancer.  ROS:   Please see the history of present illness.    All other systems reviewed and are negative.  EKGs/Labs/Other Studies Reviewed:    The following studies were reviewed today:  Left heart cath 12/15/17:  Prox LAD to Mid LAD lesion is 20% stenosed.  The left ventricular systolic function is normal.  LV end diastolic pressure is normal.  The left ventricular ejection fraction is 55-65% by visual estimate.   No significant residual  CAD with overlapping stents in the proximal to mid LAD with smooth intimal hyperplasia of 20%; normal ramus intermediate, dominant left circumflex, and nondominant RCA.  Normal LV function with an ejection fraction at 55 to 60% without focal segmental wall motion abnormalities.  RECOMMENDATION: Medical therapy with recurrent PAF, consider  EP evaluation for consideration of candidacy for ablation. Recommend to resume Rivaroxaban, at currently prescribed dose and frequency, on 12/16/2017.  Concurrent antiplatelet therapy not recommended.  EKG:  EKG is ordered today.  The ekg ordered today demonstrates sinus rhythm.  Recent Labs: 10/10/2017: ALT 18; Magnesium 2.2 12/05/2017: Pro B Natriuretic peptide (BNP) 15.0; TSH 1.71 12/06/2017: BUN 16; Creatinine, Ser 1.08; Hemoglobin 14.9; Platelets 199; Potassium 3.8; Sodium 140  Recent Lipid Panel    Component Value Date/Time   CHOL 105 08/01/2017 0813   CHOL 99 04/02/2013 0903   TRIG 94 08/01/2017 0813   TRIG 59 04/02/2013 0903   HDL 37 (L) 08/01/2017 0813   HDL 42 04/02/2013 0903   CHOLHDL 2.8 08/01/2017 0813   CHOLHDL 2.9 06/30/2016 1117   VLDL 17 06/30/2016 1117   LDLCALC 49 08/01/2017 0813   LDLCALC 45 04/02/2013 0903    Physical Exam:    VS:  BP (!) 142/80 (BP Location: Left Arm, Patient Position: Sitting)   Pulse (!) 53   Ht 6\' 2"  (1.88 m)   Wt 223 lb 12.8 oz (101.5 kg)   BMI 28.73 kg/m     Wt Readings from Last 3 Encounters:  01/02/18 223 lb 12.8 oz (101.5 kg)  12/15/17 220 lb (99.8 kg)  12/11/17 221 lb 9.6 oz (100.5 kg)     GEN: Well nourished, well developed in no acute distress HEENT: Normal NECK: No JVD; No carotid bruits LYMPHATICS: No lymphadenopathy CARDIAC: RRR, no murmurs, rubs, gallops RESPIRATORY:  Clear to auscultation without rales, wheezing or rhonchi  ABDOMEN: Soft, non-tender, non-distended MUSCULOSKELETAL:  No edema; No deformity, right radial cath site C/D/I SKIN: Warm and dry NEUROLOGIC:  Alert and  oriented x  3 PSYCHIATRIC:  Normal affect   ASSESSMENT:    1. Coronary artery disease involving native coronary artery of native heart with angina pectoris (HCC)   2. Paroxysmal atrial fibrillation ([place])   3. Type 2 diabetes mellitus without complication, without long-term current use of insulin (HCC)    PLAN:    In order of problems listed above:  Coronary artery disease involving native coronary artery of native heart with angina pectoris ([place]) Nonobstructive disease by heart cath.  He states his chest pain has resolved with Protonix and Pepcid.  Paroxysmal atrial fibrillation (HCC) EKG today with sinus rhythm.  He denies palpitations since cardioversion.  He does not think that he has had any more bouts of atrial fibrillation.  It sounds like he is very sensitive and is aware of his arrhythmia.  We discussed options if he returns to A. fib, including EP evaluation for possible ablation.  He is doing well on Xarelto and reports no bleeding issues.  We also reviewed when to return to the ER.  Type 2 diabetes mellitus without complication, without long-term current use of insulin ([place]) Per primary.   Follow-up in 6 months with Dr. Claiborne Billings.  [facility].   Medication Adjustments/Labs and Tests Ordered: Current medicines are reviewed at length with the patient today.  Concerns regarding medicines are outlined above.  No orders of the defined types were placed in this encounter.  Meds ordered this encounter  Medications  . diltiazem (CARDIZEM) 30 MG tablet    Sig: Take 1 tablet (30 mg total) by mouth daily as needed.    Dispense:  90 tablet    Refill:  1  . metoprolol succinate (TOPROL XL) 25 MG 24 hr tablet    Sig: Take 1.5 tablets (37.5 mg total) by mouth daily.    Dispense:  180 tablet    Refill:  1  . nitroGLYCERIN (NITROSTAT) 0.4 MG SL tablet    Sig: Place 1 tablet (0.4 mg total) under the tongue every 5 (five) minutes as needed for chest  pain.    Dispense:  25 tablet    Refill:  2  . rivaroxaban (XARELTO) 20 MG TABS tablet    Sig: Take 1 tablet (20 mg total) by mouth daily with supper.    Dispense:  90 tablet    Refill:  1    Signed, Ledora Bottcher, [place]  01/02/2018 10:08 AM    [facility]

## 2018-01-02 ENCOUNTER — Ambulatory Visit: Payer: BLUE CROSS/BLUE SHIELD | Admitting: Physician Assistant

## 2018-01-02 ENCOUNTER — Encounter: Payer: Self-pay | Admitting: Physician Assistant

## 2018-01-02 VITALS — BP 142/80 | HR 53 | Ht 74.0 in | Wt 223.8 lb

## 2018-01-02 DIAGNOSIS — I480 Paroxysmal atrial fibrillation: Secondary | ICD-10-CM

## 2018-01-02 DIAGNOSIS — I25119 Atherosclerotic heart disease of native coronary artery with unspecified angina pectoris: Secondary | ICD-10-CM | POA: Diagnosis not present

## 2018-01-02 DIAGNOSIS — E119 Type 2 diabetes mellitus without complications: Secondary | ICD-10-CM | POA: Diagnosis not present

## 2018-01-02 MED ORDER — RIVAROXABAN 20 MG PO TABS: 20.0000 mg | ORAL_TABLET | Freq: Every day | ORAL | 1 refills | Status: DC

## 2018-01-02 MED ORDER — NITROGLYCERIN 0.4 MG SL SUBL: 0.4000 mg | SUBLINGUAL_TABLET | SUBLINGUAL | 2 refills | Status: DC | PRN

## 2018-01-02 MED ORDER — METOPROLOL SUCCINATE ER 25 MG PO TB24: 37.5000 mg | ORAL_TABLET | Freq: Every day | ORAL | 1 refills | Status: DC

## 2018-01-02 MED ORDER — DILTIAZEM HCL 30 MG PO TABS: 30.0000 mg | ORAL_TABLET | Freq: Every day | ORAL | 1 refills | Status: DC | PRN

## 2018-01-02 NOTE — Patient Instructions (Signed)
Medication Instructions:  NO CHANGES- Your physician recommends that you continue on your current medications as directed. Please refer to the Current Medication list given to you today.  If you need a refill on your cardiac medications before your next appointment, please call your pharmacy.  Follow-Up: Your physician wants you to follow-up in: [place]. You should receive a reminder letter in the mail two months in advance. If you do not receive a letter, please call our office in DECEMBER to schedule your follow-up appointment.   Thank you for choosing [facility] at [facility]!!

## 2018-01-03 ENCOUNTER — Encounter: Payer: Self-pay | Admitting: Internal Medicine

## 2018-01-03 ENCOUNTER — Telehealth: Payer: Self-pay | Admitting: Physician Assistant

## 2018-01-03 ENCOUNTER — Ambulatory Visit (INDEPENDENT_AMBULATORY_CARE_PROVIDER_SITE_OTHER): Payer: BLUE CROSS/BLUE SHIELD | Admitting: Internal Medicine

## 2018-01-03 VITALS — BP 128/80 | HR 70 | Ht 73.0 in | Wt 221.0 lb

## 2018-01-03 DIAGNOSIS — R0609 Other forms of dyspnea: Secondary | ICD-10-CM

## 2018-01-03 DIAGNOSIS — D860 Sarcoidosis of lung: Secondary | ICD-10-CM | POA: Diagnosis not present

## 2018-01-03 LAB — PULMONARY FUNCTION TEST
DL/VA % pred: 80 %
DL/VA: 3.87 ml/min/mmHg/L
DLCO COR: 32.34 ml/min/mmHg
DLCO UNC % PRED: 89 %
DLCO UNC: 32.61 ml/min/mmHg
DLCO cor % pred: 88 %
FEF 25-75 PRE: 2.93 L/s
FEF 25-75 Post: 4.05 L/sec
FEF2575-%Change-Post: 37 %
FEF2575-%PRED-POST: 115 %
FEF2575-%PRED-PRE: 83 %
FEV1-%Change-Post: 8 %
FEV1-%PRED-POST: 106 %
FEV1-%PRED-PRE: 98 %
FEV1-Post: 4.42 L
FEV1-Pre: 4.09 L
FEV1FVC-%CHANGE-POST: 6 %
FEV1FVC-%PRED-PRE: 94 %
FEV6-%CHANGE-POST: 2 %
FEV6-%Pred-Post: 108 %
FEV6-%Pred-Pre: 105 %
FEV6-PRE: 5.52 L
FEV6-Post: 5.65 L
FEV6FVC-%Change-Post: 0 %
FEV6FVC-%Pred-Post: 103 %
FEV6FVC-%Pred-Pre: 102 %
FVC-%Change-Post: 1 %
FVC-%Pred-Post: 104 %
FVC-%Pred-Pre: 102 %
FVC-Post: 5.68 L
FVC-Pre: 5.6 L
POST FEV1/FVC RATIO: 78 %
PRE FEV1/FVC RATIO: 73 %
Post FEV6/FVC ratio: 100 %
Pre FEV6/FVC Ratio: 99 %
RV % pred: 158 %
RV: 3.66 L
TLC % pred: 130 %
TLC: 9.9 L

## 2018-01-03 NOTE — Progress Notes (Signed)
Patient completed full PFT today. 

## 2018-01-03 NOTE — Patient Instructions (Signed)
Continue acid suppression and set up follow up with Dr Elease Hashimoto and return here as needed

## 2018-01-03 NOTE — Progress Notes (Signed)
Michael Livingston, male    DOB: [birth year],    MRN: [id number]     Brief patient profile: 89 yowm quit smoking 1989 with dx sarcoid = gen chest discomfort and sob s cough > lung bx > prednisone x a year 60 mg /day and gained wt but no relapse p stopped it and then early July 2019 similar same distribution of soreness ant > post  R= L and worse doe esp in heat.   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   History of Present Illness  12/05/2017  1st pulmonary eval/ [facility]  Chief Complaint  Patient presents with  . Pulmonary Consult    Referred by Dr. Carolann Littler. Pt c/o SOB, chest and back soreness x 3 wks. He states he has a hx of sarcoid.    Dyspnea:  With walking outside in heat fast past or  [place] =  MMRC1 = can walk nl pace, flat grade, can't hurry or go uphills or steps s sob   Cough: none  Gen chest Discomfort is worse first thing in am / not related to eating/ ant > post and R=L not typically pleuritic at al.  rec Pantoprazole (protonix) 40 mg   Take  30-60 min before first meal of the day and Pepcid (famotidine)  20 mg one @  bedtime until return to office - this is the best way to tell whether stomach acid is contributing to your problem.   GERD diet  Please remember to go to the lab department downstairs in the basement  for your tests - we will call you with the results when they are available. Please schedule a follow up office visit in 4 weeks, sooner if needed with pfts on return     01/03/2018  f/u ov/[facility] re: pulmonary sarcoid hx with nl pfts  Chief Complaint  Patient presents with  . Follow-up    PFT's done today. Breathing has improved some.   Dyspnea:  Not limited by  breathing from desired activities  / not doing anything strenuous though  Cough: none Sleeping: l side down s cough/sob SABA use: none 02: none    No obvious day to day or daytime variability or assoc excess/ purulent sputum or mucus plugs or hemoptysis or cp or chest tightness, subjective wheeze or overt sinus or hb symptoms.   Sleeping as above  without nocturnal  or early am exacerbation  of respiratory  c/o's or need for noct saba. Also denies any obvious fluctuation of symptoms with weather or environmental changes or other aggravating or alleviating factors except as outlined above   No unusual exposure hx or h/o childhood pna/ asthma or knowledge of premature birth.  Current Allergies, Complete Past Medical History, Past Surgical History, Family History, and Social History were reviewed in [employer] record.  ROS  The following are not active complaints unless bolded Hoarseness, sore throat, dysphagia, dental problems, itching, sneezing,  nasal congestion or discharge of excess mucus or purulent secretions, ear ache,   fever, chills, sweats, unintended wt loss or wt gain, classically pleuritic or exertional cp,  orthopnea pnd or arm/hand swelling  or leg swelling, presyncope, palpitations,  abdominal pain, anorexia, nausea, vomiting, diarrhea  or change in bowel habits or change in bladder habits, change in stools or change in urine, dysuria, hematuria,  rash, arthralgias, visual complaints, headache, numbness, weakness or ataxia or problems with walking or coordination,  change in mood or  memory.        Current Meds  Medication Sig  . diltiazem (CARDIZEM) 30 MG tablet Take 1 tablet (30 mg total) by mouth daily as needed.  . ezetimibe-simvastatin (VYTORIN) 10-40 MG tablet TAKE 1 TABLET BY MOUTH EVERY DAY  . famotidine (PEPCID) 20 MG tablet One at bedtime (Patient taking differently: Take 20 mg by mouth at bedtime. )  . metFORMIN (GLUCOPHAGE XR) 750 MG 24 hr  tablet Take 1 tablet (750 mg total) by mouth daily with breakfast. (Patient taking differently: Take 750 mg by mouth at bedtime. )  . metoprolol succinate (TOPROL XL) 25 MG 24 hr tablet Take 1.5 tablets (37.5 mg total) by mouth daily.  . Multiple Vitamins-Minerals (MULTIVITAMIN WITH MINERALS) tablet Take 1 tablet by mouth at bedtime.   . nitroGLYCERIN (NITROSTAT) 0.4 MG SL tablet Place 1 tablet (0.4 mg total) under the tongue every 5 (five) minutes as needed for chest pain.  . pantoprazole (PROTONIX) 40 MG tablet Take 1 tablet (40 mg total) by mouth daily. Take 30-60 min before first meal of the day  . PAZEO 0.7 % SOLN INSTILL 1 DROP INTO INTO BOTH EYES EVERY MORNING AS NEEDED FOR ITCHING  . rivaroxaban (XARELTO) 20 MG TABS tablet Take 1 tablet (20 mg total) by mouth daily with supper.              Objective:    amb wm nad    01/03/2018       221   12/05/17 222 lb 9.6 oz (101 kg)  11/21/17 221 lb (100.2 kg)  11/03/17 224 lb (101.6 kg)    Vital signs reviewed - Note on arrival 02 sats  99% on RA  HEENT: nl dentition, turbinates bilaterally, and oropharynx. Nl external ear canals without cough reflex   NECK :  without JVD/Nodes/TM/ nl carotid upstrokes bilaterally   LUNGS: no acc muscle use,  Nl contour chest which is clear to A and P bilaterally without cough on insp or exp maneuvers   CV:  RRR  no s3 or murmur or increase in P2, and no edema   ABD:  soft and nontender with nl inspiratory excursion in the supine position. No bruits or organomegaly appreciated, bowel sounds nl  MS:  Nl gait/ ext warm without deformities, calf tenderness, cyanosis or clubbing No obvious joint restrictions   SKIN: warm and dry without lesions    NEURO:  alert, approp, nl sensorium with  no motor or cerebellar deficits apparent.          Assessment

## 2018-01-04 ENCOUNTER — Encounter: Payer: Self-pay | Admitting: Internal Medicine

## 2018-01-04 NOTE — Assessment & Plan Note (Addendum)
Dx/ rx in 1989 x one year - ACE level   86 on 11/21/17 with esr 15 both   non-specific but unimpressive  No evidence of active sarcoid, very unlikely to flare again after so many years off prednisone> can f/u prn new resp symptoms

## 2018-01-04 NOTE — Telephone Encounter (Signed)
error 

## 2018-01-04 NOTE — Assessment & Plan Note (Signed)
12/05/2017  Walked RA x 3 laps @ 185 ft each stopped due to  End of study, very  fast pace, no sob or desat    - Allergy profile 12/05/17   >  Eos 0.2 /  IgE  72  RAST pos ragweed,grass, tree, dog   - PFT's  01/03/2018  wnl except some variable truncation on insp f/v  Which can be seen with gerd/vcd syndromes  Clearly improved s resp rx and should therefore maintain on same rx x 6 weeks then regroup with PCP re ned for longterm gerd rx vs refer to gi if flares off them.  Pulmonary f/u is prn    I had an extended discussion with the patient reviewing all relevant studies completed to date and  lasting 15 to 20 minutes of a 25 minute summary final office  visit    Each maintenance medication was reviewed in detail including most importantly the difference between maintenance and prns and under what circumstances the prns are to be triggered using an action plan format that is not reflected in the computer generated alphabetically organized AVS.     Please see AVS for specific instructions unique to this visit that I personally wrote and verbalized to the the pt in detail and then reviewed with pt  by my nurse highlighting any  changes in therapy recommended at today's visit to their plan of care.

## 2018-01-05 ENCOUNTER — Other Ambulatory Visit (INDEPENDENT_AMBULATORY_CARE_PROVIDER_SITE_OTHER): Payer: BLUE CROSS/BLUE SHIELD

## 2018-01-05 DIAGNOSIS — I200 Unstable angina: Secondary | ICD-10-CM

## 2018-01-05 DIAGNOSIS — I480 Paroxysmal atrial fibrillation: Secondary | ICD-10-CM

## 2018-01-16 LAB — HM DIABETES EYE EXAM

## 2018-01-19 ENCOUNTER — Encounter (HOSPITAL_COMMUNITY): Payer: Self-pay

## 2018-01-19 ENCOUNTER — Other Ambulatory Visit (HOSPITAL_COMMUNITY): Payer: Self-pay

## 2018-01-23 ENCOUNTER — Other Ambulatory Visit: Payer: Self-pay | Admitting: *Deleted

## 2018-01-23 MED ORDER — METOPROLOL SUCCINATE ER 25 MG PO TB24: 37.5000 mg | ORAL_TABLET | Freq: Every day | ORAL | 3 refills | Status: DC

## 2018-01-24 ENCOUNTER — Other Ambulatory Visit: Payer: Self-pay | Admitting: *Deleted

## 2018-01-24 MED ORDER — EZETIMIBE-SIMVASTATIN 10-40 MG PO TABS
1.0000 | ORAL_TABLET | Freq: Every day | ORAL | 1 refills | Status: DC
Start: 2018-01-24 — End: 2018-08-13

## 2018-01-25 ENCOUNTER — Other Ambulatory Visit (HOSPITAL_COMMUNITY): Payer: Self-pay

## 2018-02-17 IMAGING — CR DG CHEST 2V
2 series · 2 of 2 positions shown · non-contrast
Comparison: February 13, 2006.

CLINICAL DATA: Chest pain and palpitations.

EXAM:
CHEST  2 VIEW

[w chest pa]
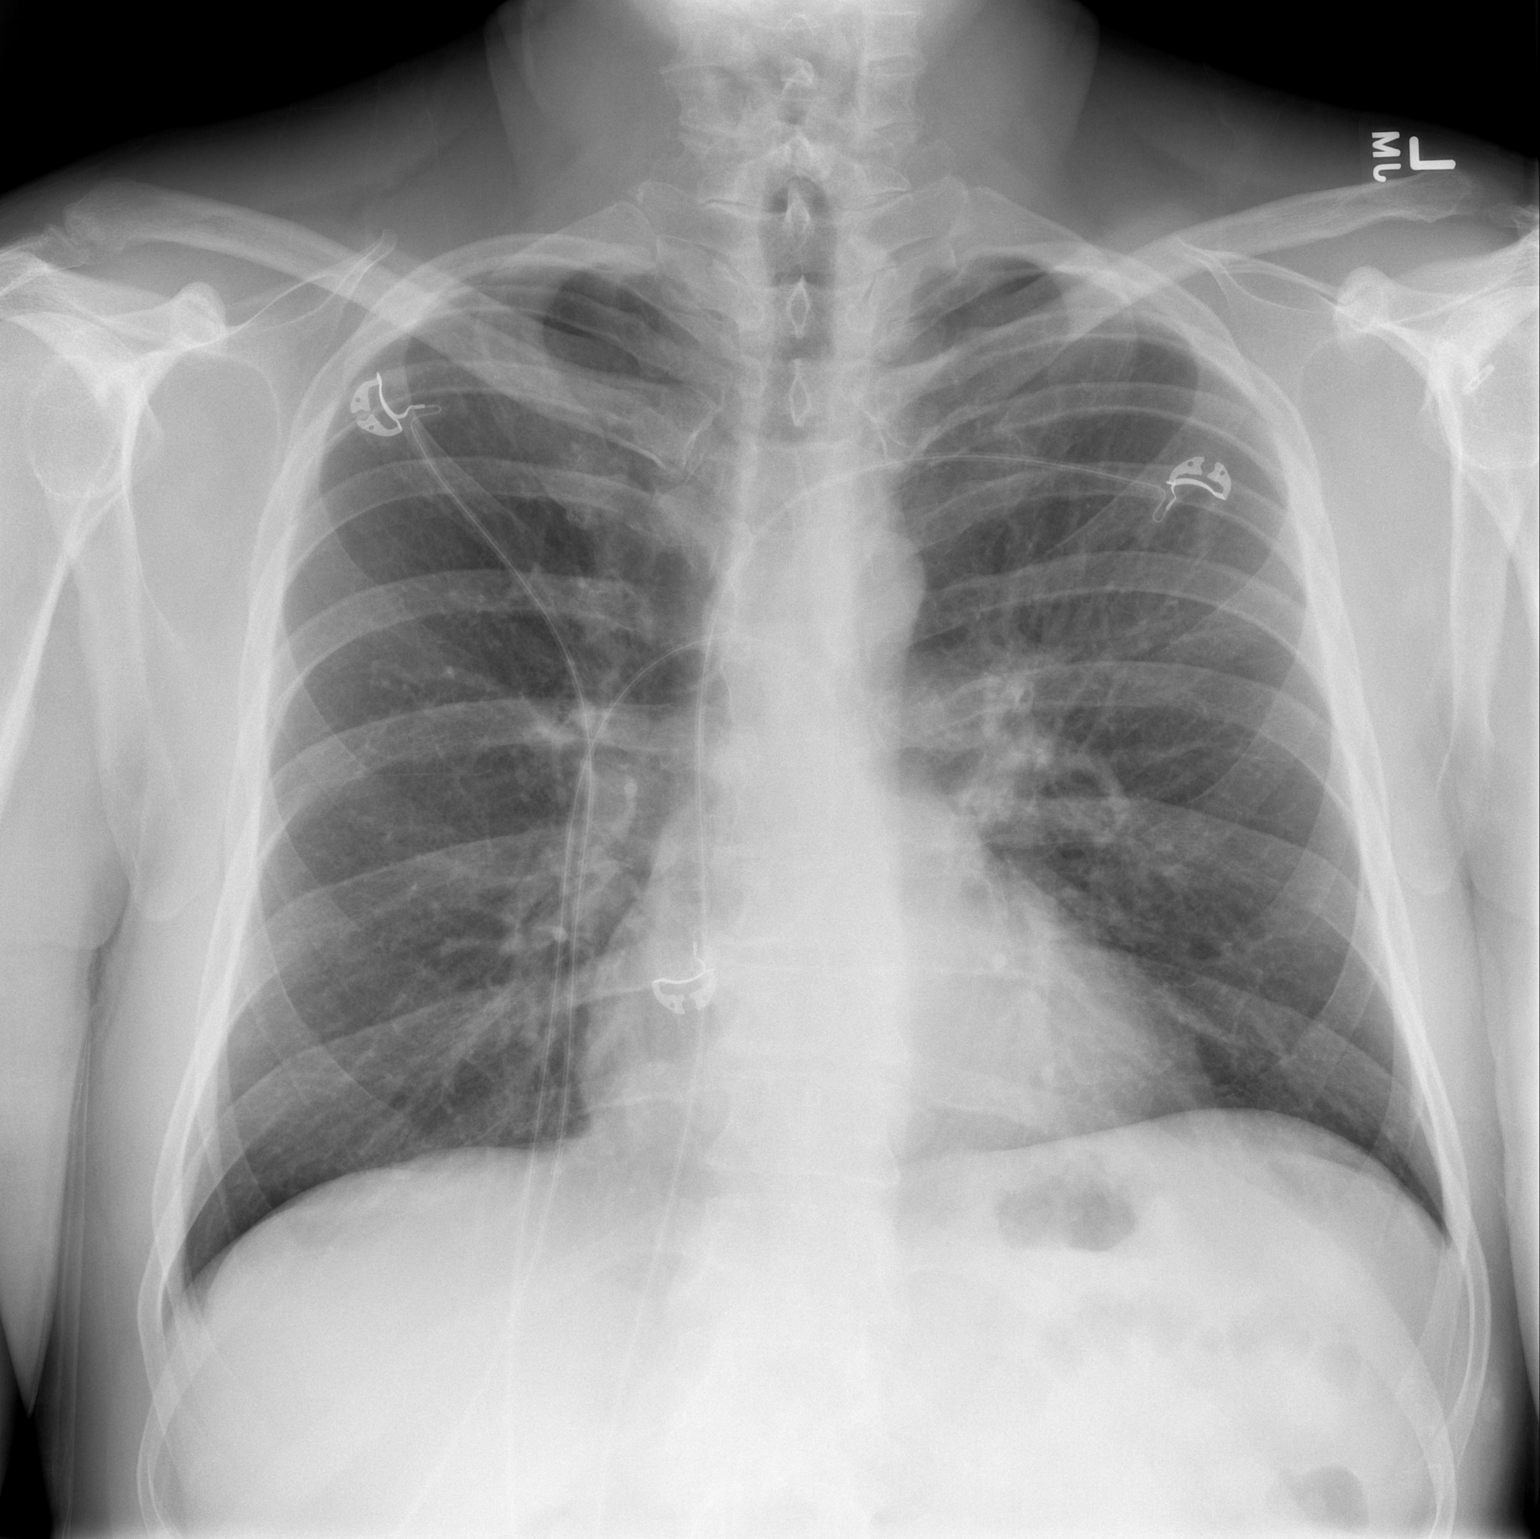

[w chest lat]
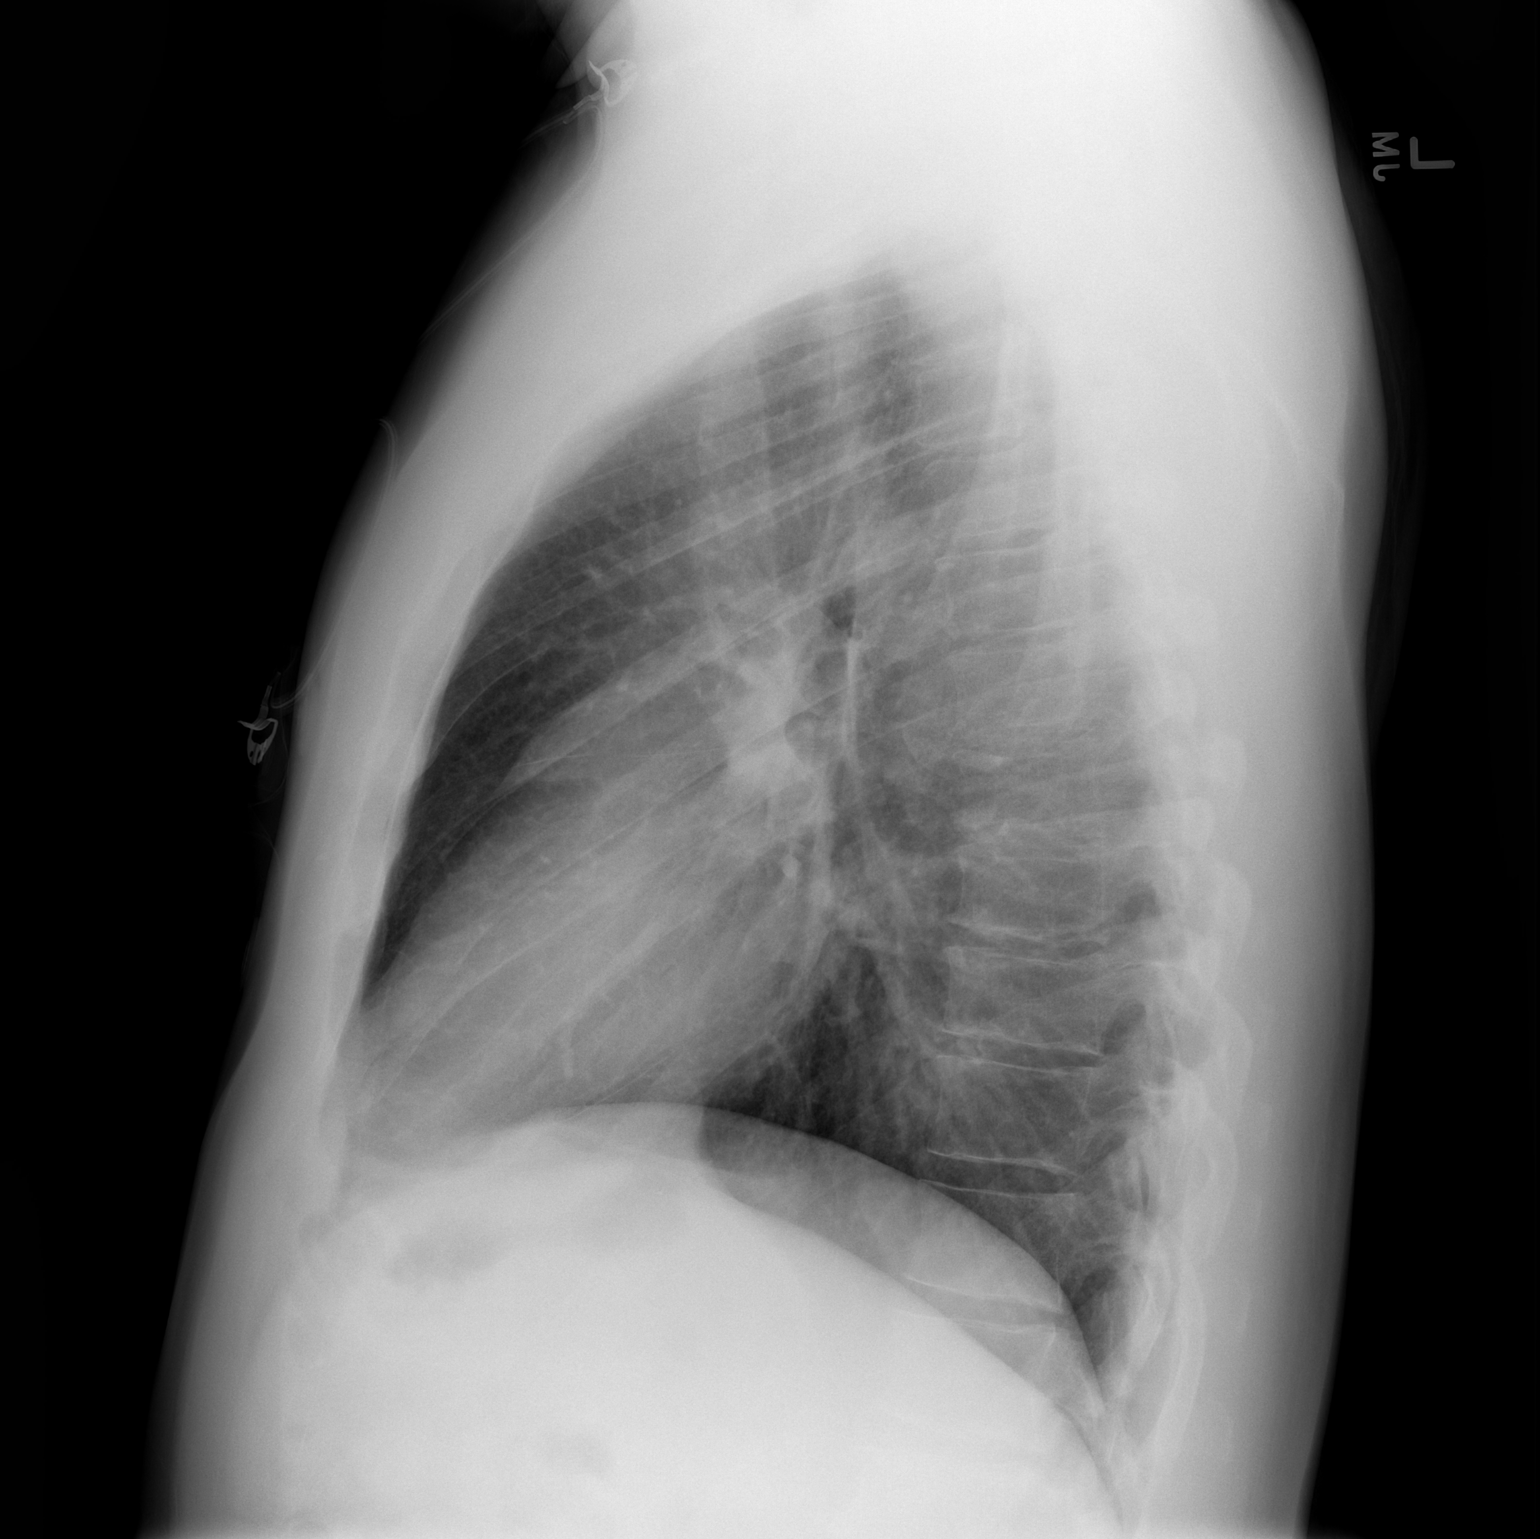

[2 of 2 positions shown; findings below may reference images not displayed]

FINDINGS: The cardiomediastinal silhouette is normal in size. Normal pulmonary
vascularity. Stable prominence of the left hilum, unchanged since
1558. No focal consolidation, pleural effusion, or pneumothorax. No
acute osseous abnormality.
IMPRESSION: No active cardiopulmonary disease.

## 2018-02-26 ENCOUNTER — Telehealth: Payer: Self-pay

## 2018-02-26 NOTE — Telephone Encounter (Signed)
We would like to see fasting sugars generally < 130.  We really need follow up soon (1 month) to reassess A1C.

## 2018-02-26 NOTE — Telephone Encounter (Signed)
Copied from [place] [id number]. Topic: General - Other >> Feb 26, 2018  8:40 AM Carolyn Stare wrote: Pt said his sugar ran high Sat 02/24/18 259 right after breakfast. So he checked it Sunday morning 02/25/18 and it was 159 and he is asking if this is ok

## 2018-02-26 NOTE — Telephone Encounter (Signed)
Last OV 11/21/17, No future OV  Please advise.

## 2018-02-26 NOTE — Telephone Encounter (Signed)
Called patient and gave him the message. Patient verbalized an understanding. Patient has an appointment on 03/07/2018 at 8:45am.

## 2018-03-07 ENCOUNTER — Ambulatory Visit (INDEPENDENT_AMBULATORY_CARE_PROVIDER_SITE_OTHER): Payer: BLUE CROSS/BLUE SHIELD | Admitting: Family Medicine

## 2018-03-07 ENCOUNTER — Encounter: Payer: Self-pay | Admitting: Family Medicine

## 2018-03-07 ENCOUNTER — Other Ambulatory Visit: Payer: Self-pay

## 2018-03-07 VITALS — BP 132/80 | HR 62 | Temp 97.9°F | Ht 73.0 in | Wt 219.9 lb

## 2018-03-07 DIAGNOSIS — E1159 Type 2 diabetes mellitus with other circulatory complications: Secondary | ICD-10-CM

## 2018-03-07 DIAGNOSIS — Z23 Encounter for immunization: Secondary | ICD-10-CM

## 2018-03-07 LAB — POCT GLYCOSYLATED HEMOGLOBIN (HGB A1C): HEMOGLOBIN A1C: 6.5 % — AB (ref 4.0–5.6)

## 2018-03-07 NOTE — Progress Notes (Signed)
Subjective:     Patient ID: Michael Livingston, male   DOB: 1960-06-16, 57 y.o.   MRN: [id number]  HPI Follow-up type 2 diabetes.  Patient has had some recurrent atrial fibrillation this year.  He had cardiac cath in August which showed only 20% proximal LAD to mid LAD.  He feels well at this time.  Blood sugars been stable.  Remains on metformin extended release.  No recent chest pains.  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  Past Medical History:  Diagnosis Date  . Allergy   . Atrial fibrillation ([place])    a. diagnosed in 01/2017 --> s/p DCCV. Started on Xarelto for anticoagulation.   Marland Kitchen CAD (coronary artery disease)    a. s/p DES to LAD in 2005 b. patent by cath in 2007 with 30% LCx stenosis noted at that time c. normal NST in 05/2011  . Chronic kidney disease    1 kidney stone  . Diabetes mellitus without complication (HCC)    type 2, no meds needed  . Hyperlipidemia   . Hypertension   . Myocardial infarction [facility])    Past Surgical History:  Procedure Laterality Date  . CARDIAC SURGERY  2005   stent  . CORONARY STENT PLACEMENT    . KNEE ARTHROSCOPY     left 2003, right 2005  . LEFT HEART CATH AND CORONARY ANGIOGRAPHY N/A 12/15/2017   Procedure: LEFT HEART CATH AND CORONARY ANGIOGRAPHY;  Surgeon: Troy Sine, MD;  Location: [place] [facility];  Service: Cardiovascular;  Laterality: N/A;  . SHOULDER SURGERY      reports that he quit smoking about 30 years ago. He has a 8.00 pack-year smoking history. He has never used smokeless tobacco. He reports that he does not drink alcohol or use drugs. family history includes Diabetes in his father and mother; Heart disease in his father and paternal grandfather. Allergies  Allergen Reactions  . Aleve [Naproxen Sodium] Swelling and Other (See Comments)    Mouth swells  . Oxycodone Other (See Comments)    Rendered the patient unable to fall asleep AND he "almost passed out"  . Sulfa Antibiotics Hives  . Vicodin  [Hydrocodone-Acetaminophen] Other (See Comments)    Rendered the patient unable to fall asleep AND he "almost passed out"     Review of Systems  Constitutional: Negative for fatigue.  Eyes: Negative for visual disturbance.  Respiratory: Negative for cough, chest tightness and shortness of breath.   Cardiovascular: Negative for chest pain, palpitations and leg swelling.  Neurological: Negative for dizziness, syncope, weakness, light-headedness and headaches.       Objective:   Physical Exam  Constitutional: He is oriented to person, place, and time. He appears well-developed and well-nourished.  HENT:  Right Ear: External ear normal.  Left Ear: External ear normal.  Mouth/Throat: Oropharynx is clear and moist.  Eyes: Pupils are equal, round, and reactive to light.  Neck: Neck supple. No thyromegaly present.  Cardiovascular: Normal rate and regular rhythm.  Pulmonary/Chest: Effort normal and breath sounds normal. No respiratory distress. He has no wheezes. He has no rales.  Musculoskeletal: He exhibits no edema.  Neurological: He is alert and oriented to person, place, and time.       Assessment:     Type 2 diabetes improved with A1c 6.5%.  This compares with 6.9% back in July    Plan:     -Continue low glycemic diet -Recommend Pneumovax -Recommend 58-month follow-up  Atiba Kimberlin W Joshau Code  MD [facility]

## 2018-03-09 ENCOUNTER — Encounter: Payer: Self-pay | Admitting: Pediatrics

## 2018-03-09 ENCOUNTER — Ambulatory Visit (INDEPENDENT_AMBULATORY_CARE_PROVIDER_SITE_OTHER): Payer: BLUE CROSS/BLUE SHIELD | Admitting: Pediatrics

## 2018-03-09 VITALS — BP 133/77 | HR 64 | Temp 97.6°F | Ht 73.0 in | Wt 222.0 lb

## 2018-03-09 DIAGNOSIS — J329 Chronic sinusitis, unspecified: Secondary | ICD-10-CM

## 2018-03-09 MED ORDER — AMOXICILLIN-POT CLAVULANATE 875-125 MG PO TABS: 1.0000 | ORAL_TABLET | Freq: Two times a day (BID) | ORAL | 0 refills | Status: DC

## 2018-03-09 NOTE — Progress Notes (Signed)
  Subjective:   Patient ID: Michael Livingston, male    DOB: 02-22-1961, 57 y.o.   MRN: [id number] CC: Sinus pain (right side radiating into tooth), runny nose  HPI: Michael Livingston is a 57 y.o. male   Has had similar symptoms before, only improved with antibiotics.  Ongoing symptoms now for at least 5 days.  Symptoms been worsening, pain bothering him the most.  Appetite is been okay.  No fevers that he knows of.  Ears feel fine.  Relevant past medical, surgical, family and social history reviewed. Allergies and medications reviewed and updated. Social History   Tobacco Use  Smoking Status Former Smoker  . Packs/day: 1.00  . Years: 8.00  . Pack years: 8.00  . Last attempt to quit: 07/17/1987  . Years since quitting: 30.6  Smokeless Tobacco Never Used   ROS: Per HPI   Objective:    BP 133/77   Pulse 64   Temp 97.6 F (36.4 C) (Oral)   Ht 6\' 1"  (1.854 m)   Wt 222 lb (100.7 kg)   BMI 29.29 kg/m   Wt Readings from Last 3 Encounters:  03/09/18 222 lb (100.7 kg)  03/07/18 219 lb 14.4 oz (99.7 kg)  01/03/18 221 lb (100.2 kg)    Gen: NAD, alert, cooperative with exam, NCAT EYES: EOMI, no conjunctival injection, or no icterus ENT:  TMs dull gray b/l, OP without erythema, ttp b/l frontal sinus, right maxillary sinus LYMPH: no cervical LAD CV: NRRR, normal S1/S2, no murmur, distal pulses 2+ b/l Resp: CTABL, no wheezes, normal WOB Ext: No edema, warm Neuro: Alert and oriented  Assessment & Plan:  Michael Livingston was seen today for sinus pain (right side radiating into tooth), runny nose.  Diagnoses and all orders for this visit:  Sinusitis, unspecified chronicity, unspecified location Treat with below.  Return precautions and symptomatic care discussed. -     amoxicillin-clavulanate (AUGMENTIN) 875-125 MG tablet; Take 1 tablet by mouth 2 (two) times daily.   Follow up plan: As needed Assunta Found, MD [place]

## 2018-03-09 NOTE — Patient Instructions (Signed)
Fever reducer and headache: tylenol and ibuprofen, can take together or alternating   Sinus pressure:  Nasal steroid such as flonase/fluticaone or nasocort daily Can also take daily antihistamine such as loratadine/claritin or cetirizine/zyrtec  Sinus rinses/irritation: Netipot or similar with distilled water 2-3 times a day to clear out sinuses or Normal saline nasal spray

## 2018-03-12 ENCOUNTER — Ambulatory Visit: Payer: Self-pay | Admitting: Internal Medicine

## 2018-03-12 ENCOUNTER — Other Ambulatory Visit: Payer: Self-pay

## 2018-03-14 ENCOUNTER — Telehealth: Payer: Self-pay | Admitting: Cardiovascular Disease

## 2018-03-14 NOTE — Telephone Encounter (Signed)
Reference#[id number]   New Message:   Please call, Vytorin Tablet 10/40 mg and Diltiazem there is a drug to drug interaction with these two medicine.

## 2018-03-14 NOTE — Telephone Encounter (Signed)
Call returned to [facility]  Okay to dispense both Rx. Patient takes diltiazem IR 30mg  as needed.   Known risk for potential increse in muscle pain

## 2018-03-21 ENCOUNTER — Other Ambulatory Visit: Payer: Self-pay | Admitting: *Deleted

## 2018-03-22 MED ORDER — RIVAROXABAN 20 MG PO TABS: 20.0000 mg | ORAL_TABLET | Freq: Every day | ORAL | 1 refills | Status: DC

## 2018-05-28 ENCOUNTER — Telehealth: Payer: Self-pay | Admitting: Internal Medicine

## 2018-05-28 DIAGNOSIS — R0609 Other forms of dyspnea: Principal | ICD-10-CM

## 2018-05-28 MED ORDER — PANTOPRAZOLE SODIUM 40 MG PO TBEC: 40.0000 mg | DELAYED_RELEASE_TABLET | Freq: Every day | ORAL | 0 refills | Status: DC

## 2018-05-28 MED ORDER — FAMOTIDINE 20 MG PO TABS: ORAL_TABLET | ORAL | 0 refills | Status: DC

## 2018-05-28 NOTE — Telephone Encounter (Signed)
Spoke with pt. He needs refills on Protonix and Pepcid. These have been sent in and I advised the pt any further refills would need to come from his PCP or he would need to make an appointment with MW to continue getting refills. Nothing further was needed.

## 2018-06-04 ENCOUNTER — Telehealth: Payer: Self-pay | Admitting: Cardiovascular Disease

## 2018-06-04 NOTE — Telephone Encounter (Signed)
Called patient and advised of PharmD note.  Patient verbalized understanding.

## 2018-06-04 NOTE — Telephone Encounter (Signed)
I wouldn't recommend any more metoprolol, as his heart rate looks to run in the 50's.  It appears that his BP is coming back down after whatever triggered the rise.  Have him continue to monitor, no more than twice daily, and lets see where it goes.  If it goes back up by the end of the week, we can determine what to do

## 2018-06-04 NOTE — Telephone Encounter (Signed)
Called patient, he states that over the weekend he has had some increase in his BP. He has had no diet changes, or lifestyle changes and is not sure why it is elevated.  Patient states that once this weekend it was 160/102, and today woke up to it 140/60, and then took his medications and rechecked it to be 139/80. He states the over the weekend it was mostly in the 150's and 140's. He would like to know if he should take an extra metoprolol, as he is currently on the succinate, but he also has the tartrate at home as well.  I advised he should make an appointment with [facility] to discuss and to have his BP checked here, but patient would like recommendations first and then have an appointment made.  Patient has upcoming appointment with TK on 03/19. Thanks!

## 2018-06-04 NOTE — Telephone Encounter (Signed)
New Message    Pt c/o BP issue: STAT if pt c/o blurred vision, one-sided weakness or slurred speech  1. What are your last 5 BP readings? Weekend BP-160/102,  Today- 140/60 and 139/80  2. Are you having any other symptoms (ex. Dizziness, headache, blurred vision, passed out)? Headache over the weekend  3. What is your BP issue? Patient's blood pressure elevated and wants to know if he should take more of blood pressure medicine.

## 2018-06-15 ENCOUNTER — Telehealth: Payer: Self-pay | Admitting: Cardiovascular Disease

## 2018-06-15 NOTE — Telephone Encounter (Signed)
New Messages:    Patient calling to report BP Tuesday 06/05/18 day, 151/103, night 153/95. Weds 06/06/18 day 143/76, night 154/96, Thursday 06/07/18 day, 150/102, night 149/88. Friday 06/08/18 day 140/83 night 150/90. Please call patient if there are any questions.

## 2018-06-15 NOTE — Telephone Encounter (Signed)
Per pt has had elevated b/p for about a week. Appt made with Michael Sims NP for Monday 06/18/18 at 11:30 am .Will forward to Dr Claiborne Billings for review and recommendations ./cy

## 2018-06-16 NOTE — Progress Notes (Signed)
Cardiology Office Note   Date:  06/18/2018   ID:  Michael Livingston, Michael Livingston 1961-03-17, MRN [id number]  PCP:  Michael Post, MD  Cardiologist: Dr. Claiborne Billings  Chief Complaint  Patient presents with  . Hypertension  . Coronary Artery Disease  . Atrial Fibrillation    PAF      History of Present Illness: Michael Livingston is a 58 y.o. male who presents for ongoing assessment and management of CAD with stent to the mid LAD in 2005, PAF with DCCV 2018. , pulmonary fibrosis, Type II diabetes, and HL.   He was noted to be in Afib RVR while being seen in ED, and underwent another DCCV which converted him to NSR. On follow up with Dr. Claiborne Billings he complained of more DOE and chest tightness. Cardiac cath on 12/15/2017, and showed no significant residual CAD with overlapping stents in the proximal to mid LAD.  No intervention was made.  Normal LV function with an ejection fraction of 55 to 60% was noted.   He is anxious about his health and is afraid of recurrence of atrial fibrillation. EP referral was considered. He was to continue on Xarelto. He called our office on 06/15/2018 reporting elevated blood pressures. His wife is a Marine scientist and has been checking it. The BP ranges into the 140's-150's consistently. He has not had any breakthrough atrial fib paroxysms.   Past Medical History:  Diagnosis Date  . Allergy   . Atrial fibrillation ([place])    a. diagnosed in 01/2017 --> s/p DCCV. Started on Xarelto for anticoagulation.   [place] CAD (coronary artery disease)    a. s/p DES to LAD in 2005 b. patent by cath in 2007 with 30% LCx stenosis noted at that time c. normal NST in 05/2011  . Chronic kidney disease    1 kidney stone  . Diabetes mellitus without complication (HCC)    type 2, no meds needed  . Hyperlipidemia   . Hypertension   . Myocardial infarction [facility])     Past Surgical History:  Procedure Laterality Date  . CARDIAC SURGERY  2005   stent  . CORONARY STENT PLACEMENT    . KNEE ARTHROSCOPY     left 2003, right 2005  . LEFT HEART CATH AND CORONARY ANGIOGRAPHY N/A 12/15/2017   Procedure: LEFT HEART CATH AND CORONARY ANGIOGRAPHY;  Surgeon: Troy Sine, MD;  Location: [facility];  Service: Cardiovascular;  Laterality: N/A;  . SHOULDER SURGERY       Current Outpatient Medications  Medication Sig Dispense Refill  . amoxicillin-clavulanate (AUGMENTIN) 875-125 MG tablet Take 1 tablet by mouth 2 (two) times daily. 20 tablet 0  . diltiazem (CARDIZEM) 30 MG tablet Take 1 tablet (30 mg total) by mouth daily as needed. 90 tablet 1  . ezetimibe-simvastatin (VYTORIN) 10-40 MG tablet Take 1 tablet by mouth daily. 90 tablet 1  . famotidine (PEPCID) 20 MG tablet One at bedtime 90 tablet 0  . metFORMIN (GLUCOPHAGE XR) 750 MG 24 hr tablet Take 1 tablet (750 mg total) by mouth daily with breakfast. (Patient taking differently: Take 750 mg by mouth at bedtime. ) 90 tablet 3  . metoprolol succinate (TOPROL XL) 25 MG 24 hr tablet Take 1.5 tablets (37.5 mg total) by mouth daily. 270 tablet 3  . Multiple Vitamins-Minerals (MULTIVITAMIN WITH MINERALS) tablet Take 1 tablet by mouth at bedtime.     . nitroGLYCERIN (NITROSTAT) 0.4 MG SL tablet Place 1 tablet (0.4 mg total) under the tongue  every 5 (five) minutes as needed for chest pain. 25 tablet 2  . pantoprazole (PROTONIX) 40 MG tablet Take 1 tablet (40 mg total) by mouth daily. Take 30-60 min before first meal of the day 90 tablet 0  . PAZEO 0.7 % SOLN INSTILL 1 DROP INTO INTO BOTH EYES EVERY MORNING AS NEEDED FOR ITCHING  4  . rivaroxaban (XARELTO) 20 MG TABS tablet Take 1 tablet (20 mg total) by mouth daily with supper. 90 tablet 1   No current facility-administered medications for this visit.     Allergies:   Aleve [naproxen sodium]; Oxycodone; Sulfa antibiotics; and Vicodin [hydrocodone-acetaminophen]    Social History:  The patient  reports that he quit smoking about 30 years ago. He has a 8.00 pack-year smoking history. He has never used  smokeless tobacco. He reports that he does not drink alcohol or use drugs.   Family History:  The patient's family history includes Diabetes in his father and mother; Heart disease in his father and paternal grandfather.    ROS: All other systems are reviewed and negative. Unless otherwise mentioned in H&P    PHYSICAL EXAM: VS:  There were no vitals taken for this visit. , BMI There is no height or weight on file to calculate BMI. GEN: Well nourished, well developed, in no acute distress HEENT: normal Neck: no JVD, carotid bruits, or masses Cardiac: RRR; no murmurs, rubs, or gallops,no edema  Respiratory:  Clear to auscultation bilaterally, normal work of breathing GI: soft, nontender, nondistended, + BS MS: no deformity or atrophy Skin: warm and dry, no rash Neuro:  Strength and sensation are intact Psych: euthymic mood, full affect   EKG: NSR rate of 69 bpm   Recent Labs: 10/10/2017: ALT 18; Magnesium 2.2 12/05/2017: Pro B Natriuretic peptide (BNP) 15.0; TSH 1.71 12/06/2017: BUN 16; Creatinine, Ser 1.08; Hemoglobin 14.9; Platelets 199; Potassium 3.8; Sodium 140    Lipid Panel    Component Value Date/Time   CHOL 105 08/01/2017 0813   CHOL 99 04/02/2013 0903   TRIG 94 08/01/2017 0813   TRIG 59 04/02/2013 0903   HDL 37 (L) 08/01/2017 0813   HDL 42 04/02/2013 0903   CHOLHDL 2.8 08/01/2017 0813   CHOLHDL 2.9 06/30/2016 1117   VLDL 17 06/30/2016 1117   LDLCALC 49 08/01/2017 0813   LDLCALC 45 04/02/2013 0903      Wt Readings from Last 3 Encounters:  03/09/18 222 lb (100.7 kg)  03/07/18 219 lb 14.4 oz (99.7 kg)  01/03/18 221 lb (100.2 kg)      Other studies Reviewed: Left heart cath 12/15/17:  Prox LAD to Mid LAD lesion is 20% stenosed.  The left ventricular systolic function is normal.  LV end diastolic pressure is normal.  The left ventricular ejection fraction is 55-65% by visual estimate.  No significant residual CAD with overlapping stents in the proximal to  mid LAD with smooth intimal hyperplasia of 20%; normal ramus intermediate, dominant left circumflex, and nondominant RCA.  Normal LV function with an ejection fraction at 55 to 60% without focal segmental wall motion abnormalities.  RECOMMENDATION: Medical therapy with recurrent PAF, consider EP evaluation for consideration of candidacy for ablation. Recommend to resume Rivaroxaban, at currently prescribed dose and frequency, on 12/16/2017. Concurrent antiplatelet therapy not recommended.   ASSESSMENT AND PLAN:  1. Hypertension:He has recently been diagnosed with Type II diabetes. With BP elevations at home, he will be placed on lisinopril 2.5 mg daily. He will take his BP BID and  record. Will see him back in a couple of weeks for a BP check., and one month for follow up.   2. CAD: Hx of stent to LAD: He has had cardiac cath in  12/2017 with patent stent, but with 20% stenosis noted. He will continue on secondary prevention for CAD.   3.Hypercholesterolemai:  Continue statin therapy. LDL last check was 49.  4. Atrial fib: He is currently in NSR. He continues on Xarelto. He is very interested in ablation if this were to reoccur.   Current medicines are reviewed at length with the patient today.    Labs/ tests ordered today include: None  Phill Myron. West Pugh, ANP, AACC   06/18/2018 11:40 AM    [address] Office [phone] Fax [phone]

## 2018-06-18 ENCOUNTER — Ambulatory Visit: Payer: BLUE CROSS/BLUE SHIELD | Admitting: Adult Health

## 2018-06-18 ENCOUNTER — Encounter: Payer: Self-pay | Admitting: Adult Health

## 2018-06-18 VITALS — BP 148/84 | HR 69 | Ht 74.0 in | Wt 220.0 lb

## 2018-06-18 DIAGNOSIS — I10 Essential (primary) hypertension: Secondary | ICD-10-CM

## 2018-06-18 DIAGNOSIS — E7800 Pure hypercholesterolemia, unspecified: Secondary | ICD-10-CM

## 2018-06-18 DIAGNOSIS — I480 Paroxysmal atrial fibrillation: Secondary | ICD-10-CM

## 2018-06-18 DIAGNOSIS — I2510 Atherosclerotic heart disease of native coronary artery without angina pectoris: Secondary | ICD-10-CM | POA: Diagnosis not present

## 2018-06-18 MED ORDER — LISINOPRIL 2.5 MG PO TABS: 2.5000 mg | ORAL_TABLET | Freq: Every day | ORAL | 3 refills | Status: DC

## 2018-06-18 NOTE — Patient Instructions (Signed)
Medication Instructions:  START LISINOPRIL 2.5MG  DAILY  If you need a refill on your cardiac medications before your next appointment, please call your pharmacy.  Labwork: When you have your labs (blood work) drawn today and your tests are completely normal, you will receive your results only by MyChart Message (if you have MyChart) -OR-  A paper copy in the mail.  If you have any lab test that is abnormal or we need to change your treatment, we will call you to review these results.  Special Instructions: TAKE AND LOG YOUR BP TWICE DAILY AT THE SAME TIME DAILY  Follow-Up: You will need a follow up appointment in 1 months.  You may see Shelva Majestic, MD Jory Sims, DNP, AACC or one of the following Advanced Practice Providers on your designated Care Team:  Almyra Deforest, [place]  Fabian Sharp, PA-C  At [facility], you and your health needs are our priority.  As part of our continuing mission to provide you with exceptional heart care, we have created designated Provider Care Teams.  These Care Teams include your primary Cardiologist (physician) and Advanced Practice Providers (APPs -  Physician Assistants and Nurse Practitioners) who all work together to provide you with the care you need, when you need it.  Thank you for choosing [facility] at [facility]!!

## 2018-06-18 NOTE — Telephone Encounter (Signed)
Patient seen and discussed with NP, medication Lisinopril added, and follow up with [facility] for March.

## 2018-06-19 ENCOUNTER — Telehealth (INDEPENDENT_AMBULATORY_CARE_PROVIDER_SITE_OTHER): Payer: BLUE CROSS/BLUE SHIELD

## 2018-06-19 DIAGNOSIS — I25119 Atherosclerotic heart disease of native coronary artery with unspecified angina pectoris: Secondary | ICD-10-CM

## 2018-06-19 DIAGNOSIS — I2510 Atherosclerotic heart disease of native coronary artery without angina pectoris: Secondary | ICD-10-CM | POA: Diagnosis not present

## 2018-06-19 NOTE — Telephone Encounter (Signed)
ekg order placed.

## 2018-07-10 ENCOUNTER — Telehealth: Payer: Self-pay | Admitting: Cardiovascular Disease

## 2018-07-10 NOTE — Telephone Encounter (Signed)
New Message   Pt c/o medication issue:  1. Name of Medication: Lisinopril  2. How are you currently taking this medication (dosage and times per day)? 2.5mg  1x daily   3. Are you having a reaction (difficulty breathing--STAT)? No   4. What is your medication issue? Pt says his BP is still running high and is wondering if he needs to increase this medication  This morning 167/106 Yesterday Morning- 155/95  Last night- 152/109

## 2018-07-10 NOTE — Telephone Encounter (Signed)
Please increase lisinopril to 5mg  daily and keep f/u appointment with DR Claiborne Billings on 07/26/2018

## 2018-07-10 NOTE — Telephone Encounter (Signed)
Returned call to patient he stated his B/P has been elevated 140 to 150 [phone].Pulse 60's.Stated he did receive a steroid shot in big toe 07/06/18.Stated he was wanting to know if he needs to increase Lisinopril he presently takes 2.5 mg daily.Advised I will send message to pharmacy for advice.

## 2018-07-10 NOTE — Telephone Encounter (Signed)
Returned call to patient Michael Livingston advised to increase Lisinopril to 5 mg daily.Advised to keep appointment with [facility] 07/26/18.

## 2018-07-18 ENCOUNTER — Ambulatory Visit: Payer: Self-pay | Admitting: Adult Health

## 2018-07-26 ENCOUNTER — Ambulatory Visit: Payer: BLUE CROSS/BLUE SHIELD | Admitting: Cardiovascular Disease

## 2018-07-26 ENCOUNTER — Other Ambulatory Visit: Payer: Self-pay

## 2018-07-26 ENCOUNTER — Encounter: Payer: Self-pay | Admitting: Cardiovascular Disease

## 2018-07-26 VITALS — BP 118/82 | HR 60 | Ht 74.0 in | Wt 215.2 lb

## 2018-07-26 DIAGNOSIS — Z7901 Long term (current) use of anticoagulants: Secondary | ICD-10-CM | POA: Diagnosis not present

## 2018-07-26 DIAGNOSIS — E785 Hyperlipidemia, unspecified: Secondary | ICD-10-CM

## 2018-07-26 DIAGNOSIS — I25119 Atherosclerotic heart disease of native coronary artery with unspecified angina pectoris: Secondary | ICD-10-CM | POA: Diagnosis not present

## 2018-07-26 DIAGNOSIS — I10 Essential (primary) hypertension: Secondary | ICD-10-CM | POA: Diagnosis not present

## 2018-07-26 DIAGNOSIS — I480 Paroxysmal atrial fibrillation: Secondary | ICD-10-CM

## 2018-07-26 DIAGNOSIS — K219 Gastro-esophageal reflux disease without esophagitis: Secondary | ICD-10-CM

## 2018-07-26 NOTE — Patient Instructions (Signed)

## 2018-07-26 NOTE — Progress Notes (Signed)
Patient ID: Michael Livingston, male   DOB: Jun 21, 1960, 58 y.o.   MRN: [id number]     HPI: Michael Livingston is a 58 y.o. male presents to the office for a 66-monthfollow-up cardiology evaluation.  Michael Livingston CAD and in October 2005 underwent stenting to his proximal to mid left anterior descending artery. His last catheterization was in October 2007 and his LAD was  was widely patent. Michael Livingston did have mild 30% circumflex stenosis. A nuclear perfusion study in January 2013 continued to show normal perfusion.  Michael Livingston a history of palpitations which have been controlled with beta blocker therapy. Michael Livingston has a history of hyperlipidemia with an atherogenic lipid panel with low HDL levels. An NMR lipoprofile in 2014 demonstrated increased insulin resistance and an increased wall LDL particles.  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 Since that time, Michael Livingston has lost over 20 pounds. Michael Livingston denies recent chest pain. Michael Livingston denies significant increase in palpitations except a rare episode.   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  Michael Livingston denies palpitations.  Michael Livingston denies PND, orthopnea.  Last year Michael Livingston underwent orthopedic surgery to his shoulder and tolerated this well.    Michael Livingston was started on metformin for diabetes mellitus.  In June 2017 hemoglobin A1c was 6.4, and most recently in January 2018  improved to 5.7.  Michael Livingston last saw Michael Livingston, Michael Livingston did note mild shortness of breath with arm raising and denied exertional chest pain or palpitations.  Michael Livingston was on aspirin and Plavix for dual antiplatelet therapy.  Michael Livingston was on ramipril 5 mg, metoprolol 25 mg twice a day for hypertension and Vytorin 1044.  Hyperlipidemia.  Heme notes mild shortness of breath Michael Michael Livingston raises his arms.  Michael Livingston denies exertional chest pain symptoms or exertional dyspnea.  Michael Livingston continues to be on aspirin  and Plavix for dual antiplatelet therapy.  Michael Livingston continues to be on ram a pro-5 mg, metoprolol 25 mg twice a day for hypertension.  Michael Livingston is on Vytorin 10/40 for hyperlipidemia, as well as lovaza.  Since Michael Livingston last saw Michael Livingston, Michael Livingston developed new onset atrial fibrillation on 01/19/2017.  [facility]  CBC be met were normal as was his TSH.  Michael Livingston underwent successful DC cardioversion at 150 J with restoration of sinus rhythm.  His Lopressor dose was increased to 30 mg twice a day, and Michael Livingston was started on Xarelto 20 mg for a cha2ds2score of 3 (CAD, hypertension, diabetes mellitus.  His Plavix was discontinued.  Michael Livingston saw [facility] office follow-up.  Michael Livingston was doing well without recurrence.  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  The month previously, Michael Livingston had significantly banged his head on a piling intrahepatic and as result was very concerned about work-related trauma, particularly while being on Xarelto. Michael Livingston was unaware of any recurrent episodes of arrhythmia.  We had a lengthy discussion about risk-benefit ratio of anticoagulation, particularly with his job.  Michael Livingston preferred not continue Xarelto due to his job risk.  As result, Xarelto was discontinued and Michael Livingston was resumed back on aspirin 81 mg and Plavix 75 mg. Michael Livingston increased metoprolol recommended Michael Livingston continue to take lisinopril 20 mg daily.  If Michael Livingston  were to develop any recurrent episodes of AFeinitiation of anticoagulation would be necessary.  Michael Livingston in the office in March 2019 Michael Livingston apparently was only taking metoprolol tartrate once a day.  As result, Michael Livingston recommended switching Michael Livingston to metoprolol succinate allow for improved sustained action.  Michael Livingston had been doing well was unaware of any arrhythmias until October 10, 2017.  It was extremely hot, Michael Livingston was working on a roof, Michael Livingston was dehydrated, and Michael Livingston noticed his heart rate speeding up.  Michael Livingston quickly got off the roof.  Michael Livingston was seen by Dr. Laurance Flatten and  presented to [facility] ER where Michael Livingston was found to be in AF with ventricular rate in the 150s to 160s.  Michael Livingston saw Michael Livingston in the emergency room and in the ER Michael Livingston underwent successful cardioversion with restoration of sinus rhythm.  At that time Michael Livingston was advised to increase metoprolol succinate to 37.5 mg and instead of taking the medication at bedtime to take it in the morning.  Michael Livingston was also given a prescription for short acting diltiazem 30 mg to take on a as needed basis.  Michael Livingston saw Michael Livingston in the office 1 week later on October 18, 2017 at which time Michael Livingston was doing well and was not experiencing any recurrent arrhythmia.    Since Michael Livingston last saw Michael Livingston, Michael Livingston was seen as an add-on in the office on December 11, 2017 by Fabian Sharp, [place].  At that time Michael Livingston had complaints of shortness of breath with activity as well as vague chest fullness and tightness with minimal work.  Michael Livingston was set up for repeat cardiac catheterization by me on December 15, 2017.  This did not reveal any significant residual CAD and Michael Livingston had overlapping stents in the proximal to mid LAD with smooth intimal hyperplasia of 20%.  Michael Livingston had a normal ramus intermediate, dominant left circumflex nondominant RCA.  Medical therapy was recommended and if Michael Livingston had recurrent PAF consider potential candidacy for ablation.  Michael Livingston was restarted back on Xarelto.  Michael Livingston was seen for hospital follow-up with Fabian Sharp, [place] on January 02, 2018 and Michael Livingston was doing well.  Michael Livingston has occasional chest pain twinges had largely resolved with Protonix.  Presently, Michael Livingston feels well.  Michael Livingston has not had any recurrent episodes of arrhythmia.  Michael Livingston was seen on June 18, 2018 by Jory Sims, nurse practitioner because of concerns for possible blood pressure elevation with monitoring of his home blood pressure unit.  Lisinopril was initiated.  Most recently Michael Livingston is now on lisinopril 5 mg, metoprolol succinate 37.5 mg daily and has a prescription for diltiazem 30 mg to take as needed if recurrent palpitations.  Michael Livingston is not required any additional  diltiazem supplementation.  Michael Livingston continues to take Protonix for GERD.  Michael Livingston is on Xarelto for anticoagulation.  Michael Livingston continues to be on Vytorin 10/40 for hyperlipidemia.  Michael Livingston presents for reevaluation.  Past Medical History:  Diagnosis Date   Allergy    Atrial fibrillation ([place])    a. diagnosed in 01/2017 --> s/p DCCV. Started on Xarelto for anticoagulation.    CAD (coronary artery disease)    a. s/p DES to LAD in 2005 b. patent by cath in 2007 with 30% LCx stenosis noted at that time c. normal NST in 05/2011   Chronic kidney disease    1 kidney stone   Diabetes mellitus without complication ([place])    type 2, no meds needed   Hyperlipidemia    Hypertension    Myocardial infarction ([place])  Past Surgical History:  Procedure Laterality Date   CARDIAC SURGERY  2005   stent   CORONARY STENT PLACEMENT     KNEE ARTHROSCOPY     left 2003, right 2005   LEFT HEART CATH AND CORONARY ANGIOGRAPHY N/A 12/15/2017   Procedure: LEFT HEART CATH AND CORONARY ANGIOGRAPHY;  Surgeon: Troy Sine, MD;  Location: [facility];  Service: Cardiovascular;  Laterality: N/A;   SHOULDER SURGERY      Allergies  Allergen Reactions   Aleve [Naproxen Sodium] Swelling and Other (See Comments)    Mouth swells   Oxycodone Other (See Comments)    Rendered the patient unable to fall asleep AND Michael Livingston "almost passed out"   Sulfa Antibiotics Hives   Vicodin [Hydrocodone-Acetaminophen] Other (See Comments)    Rendered the patient unable to fall asleep AND Michael Livingston "almost passed out"    Current Outpatient Medications  Medication Sig Dispense Refill   diltiazem (CARDIZEM) 30 MG tablet Take 1 tablet (30 mg total) by mouth daily as needed. 90 tablet 1   ezetimibe-simvastatin (VYTORIN) 10-40 MG tablet Take 1 tablet by mouth daily. 90 tablet 1   famotidine (PEPCID) 20 MG tablet One at bedtime 90 tablet 0   lisinopril (PRINIVIL,ZESTRIL) 5 MG tablet Take 1 tablet (5 mg total) by mouth daily. 90 tablet 3     metFORMIN (GLUCOPHAGE XR) 750 MG 24 hr tablet Take 1 tablet (750 mg total) by mouth daily with breakfast. (Patient taking differently: Take 750 mg by mouth at bedtime. ) 90 tablet 3   metoprolol succinate (TOPROL XL) 25 MG 24 hr tablet Take 1.5 tablets (37.5 mg total) by mouth daily. 270 tablet 3   Multiple Vitamins-Minerals (MULTIVITAMIN WITH MINERALS) tablet Take 1 tablet by mouth at bedtime.      nitroGLYCERIN (NITROSTAT) 0.4 MG SL tablet Place 1 tablet (0.4 mg total) under the tongue every 5 (five) minutes as needed for chest pain. 25 tablet 2   pantoprazole (PROTONIX) 40 MG tablet Take 1 tablet (40 mg total) by mouth daily. Take 30-60 min before first meal of the day 90 tablet 0   PAZEO 0.7 % SOLN INSTILL 1 DROP INTO INTO BOTH EYES EVERY MORNING AS NEEDED FOR ITCHING  4   rivaroxaban (XARELTO) 20 MG TABS tablet Take 1 tablet (20 mg total) by mouth daily with supper. 90 tablet 1   No current facility-administered medications for this visit.     Social History   Socioeconomic History   Marital status: Married    Spouse name: Not on file   Number of children: Not on file   Years of education: Not on file   Highest education level: Not on file  Occupational History   Not on file  Social Needs   Financial resource strain: Not on file   Food insecurity:    Worry: Not on file    Inability: Not on file   Transportation needs:    Medical: Not on file    Non-medical: Not on file  Tobacco Use   Smoking status: Former Smoker    Packs/day: 1.00    Years: 8.00    Pack years: 8.00    Last attempt to quit: 07/17/1987    Years since quitting: 31.0   Smokeless tobacco: Never Used  Substance and Sexual Activity   Alcohol use: No    Alcohol/week: 0.0 standard drinks   Drug use: No   Sexual activity: Not on file  Lifestyle   Physical activity:  Days per week: Not on file    Minutes per session: Not on file   Stress: Not on file  Relationships   Social  connections:    Talks on phone: Not on file    Gets together: Not on file    Attends religious service: Not on file    Active member of club or organization: Not on file    Attends meetings of clubs or organizations: Not on file    Relationship status: Not on file   Intimate partner violence:    Fear of current or ex partner: Not on file    Emotionally abused: Not on file    Physically abused: Not on file    Forced sexual activity: Not on file  Other Topics Concern   Not on file  Social History Narrative   Not on file    Family History  Problem Relation Age of Onset   Diabetes Mother    Heart disease Father    Diabetes Father    Heart disease Paternal Grandfather    Colon cancer Neg Hx    Esophageal cancer Neg Hx    Stomach cancer Neg Hx    Rectal cancer Neg Hx    Social history is notable that Michael Livingston is married and has 2 children. There is no tobacco or alcohol use. Michael Livingston has an autistic son.   ROS General: Negative; No fevers, chills, or night sweats; there is purposeful weight loss HEENT: Negative; No changes in vision or hearing, sinus congestion, difficulty swallowing Pulmonary: Negative; No cough, wheezing, shortness of breath, hemoptysis Cardiovascular: See history of present illness: No presyncope, syncope, palpitations GI: GERD GU: Negative; No dysuria, hematuria, or difficulty voiding Musculoskeletal: Left shoulder labrum tear Hematologic/Oncology: Negative; no easy bruising, bleeding Endocrine: Negative; no heat/cold intolerance; no diabetes Neuro: Negative; no changes in balance, headaches Skin: Negative; No rashes or skin lesions Psychiatric: Negative; No behavioral problems, depression Sleep: Negative; No snoring, daytime sleepiness, hypersomnolence, bruxism, restless legs, hypnogognic hallucinations, no cataplexy Other comprehensive 14 point system review is negative.   PE BP 118/82    Pulse 60    Ht _0  (1.88 m)    Wt 215 lb 3.2 oz (97.6 kg)     BMI 27.63 kg/m    Repeat blood pressure by me was 120/76 supine and 118/74 standing.  Wt Readings from Last 3 Encounters:  07/26/18 215 lb 3.2 oz (97.6 kg)  06/18/18 220 lb (99.8 kg)  03/09/18 222 lb (100.7 kg)   General: Alert, oriented, no distress.  Skin: normal turgor, no rashes, warm and dry HEENT: Normocephalic, atraumatic. Pupils equal round and reactive to light; sclera anicteric; extraocular muscles intact;  Nose without nasal septal hypertrophy Mouth/Parynx benign; Mallinpatti scale 3 Neck: No JVD, no carotid bruits; normal carotid upstroke Lungs: clear to ausculatation and percussion; no wheezing or rales Chest wall: without tenderness to palpitation Heart: PMI not displaced, RRR, s1 s2 normal, 1/6 systolic murmur, no diastolic murmur, no rubs, gallops, thrills, or heaves Abdomen: soft, nontender; no hepatosplenomehaly, BS+; abdominal aorta nontender and not dilated by palpation. Back: no CVA tenderness Pulses 2+ Musculoskeletal: full range of motion, normal strength, no joint deformities Extremities: no clubbing cyanosis or edema, Homan's sign negative  Neurologic: grossly nonfocal; Cranial nerves grossly wnl Psychologic: Normal mood and affect t  ECG (independently read by me): NSR at 60; Normal intervals  October 18, 2017 ECG (independently read by me): Sinus rhythm at 64 bpm.  Normal intervals.  No ectopy.  March  2018 ECG (independently read by me): normal sinus rhythm at 61 bpm.  Normal intervals.  No ectopy.  No ST segment changes.  December 2018 ECG (independently read by me):  Sinus bradycardia at 55 bpm.  PR interal 168 ms.  QTc inteval 417 ms. No ST-T changes.  February 2018 ECG (independently read by me): Sinus bradycardia at 48 bpm.  No ST segment changes.  Normal intervals.  June 2017 ECG (independently read by me): Sinus bradycardia 58 bpm.  Normal intervals.  No ST segment changes.  September 2016 ECG (independently read by me): Sinus bradycardia 58 bpm.   Mild RV conduction delay.  September 2015 ECG:( Independently read by me): Sinus bradycardia 50 beats per minute.  No ST segment changes.  Normal intervals.   November 2014 ECG: Sinus bradycardia at 45 beats per minute. No significant ST-T change.  LABS: BMP Latest Ref Rng & Units 12/06/2017 12/05/2017 10/10/2017  Glucose 70 - 99 mg/dL 124(H) 151(H) 133(H)  BUN 6 - 20 mg/dL _0 Creatinine 0.61 - 1.24 mg/dL 1.08 1.04 1.05  BUN/Creat Ratio 9 - 20 - - -  Sodium 135 - 145 mmol/L 140 138 143  Potassium 3.5 - 5.1 mmol/L 3.8 3.8 4.0  Chloride 98 - 111 mmol/L 104 103 108  CO2 22 - 32 mmol/L _1 Calcium 8.9 - 10.3 mg/dL 9.7 9.4 9.3   Hepatic Function Latest Ref Rng & Units 10/10/2017 08/01/2017 06/30/2016  Total Protein 6.5 - 8.1 g/dL 7.5 7.6 7.2  Albumin 3.5 - 5.0 g/dL 4.2 4.5 4.4  AST 15 - 41 U/L _2 ALT 17 - 63 U/L _3 Alk Phosphatase 38 - 126 U/L 53 60 56  Total Bilirubin 0.3 - 1.2 mg/dL 1.0 0.7 1.2   CBC Latest Ref Rng & Units 12/06/2017 12/05/2017 11/21/2017  WBC 4.0 - 10.5 K/uL 7.7 7.2 7.0  Hemoglobin 13.0 - 17.0 g/dL 14.9 14.6 14.5  Hematocrit 39.0 - 52.0 % 44.1 41.0 41.6  Platelets 150 - 400 K/uL 199 198.0 217.0   Lab Results  Component Value Date   MCV 93.6 12/06/2017   MCV 91.6 12/05/2017   MCV 93.1 11/21/2017   Lab Results  Component Value Date   TSH 1.71 12/05/2017   Lab Results  Component Value Date   HGBA1C 6.5 (A) 03/07/2018   Lipid Panel     Component Value Date/Time   CHOL 105 08/01/2017 0813   CHOL 99 04/02/2013 0903   TRIG 94 08/01/2017 0813   TRIG 59 04/02/2013 0903   HDL 37 (L) 08/01/2017 0813   HDL 42 04/02/2013 0903   CHOLHDL 2.8 08/01/2017 0813   CHOLHDL 2.9 06/30/2016 1117   VLDL 17 06/30/2016 1117   LDLCALC 49 08/01/2017 0813   LDLCALC 45 04/02/2013 0903     RADIOLOGY: No results found.  IMPRESSION:  1. Coronary artery disease involving native coronary artery of native heart with angina pectoris [facility]): LAD stent 2005.    2. Essential hypertension   3. Paroxysmal atrial fibrillation (HCC)   4. Anticoagulated   5. Hyperlipidemia with target LDL less than 70   6. Gastroesophageal reflux disease without esophagitis     ASSESSMENT AND PLAN: Michael Livingston is a 58 year old white male who is 14 1/2 years status post stenting to his proximal to mid LAD in October 2005.  Catheterization in October 2007 demonstrated a widely patent LAD stent and Michael Livingston had mild 30% narrowing.  A nuclear perfusion  study in January 2013 continued to show normal perfusion.  Michael Livingston has not had any recurrent anginal symptomatology. Michael Livingston apparently developed an episode of atrial fibrillation with RVR leading to a 01/19/2017 ER evaluation.  Michael Livingston had new onset of symptomatology and was successfully cardioverted in the emergency room.  At that time his beta blocker regimen was increased and Michael Livingston was started on Xarelto and Plavix was discontinued.  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  Michael Livingston in March 2019, Michael Livingston had stated that Michael Livingston had banged his head working on a roof and was concerned about recurrent potential head trauma particularly on anticoagulation.  At that time, since Michael Livingston had not had any recurrent atrial fibrillation episodes his Xarelto was discontinued and Michael Livingston was switched back to aspirin and Plavix.  Michael Livingston was only taking short acting metoprolol once a day instead of the prescribed twice a day dosing and this was switched to metoprolol succinate.  Michael Livingston had been doing well until early June 2019  Michael in the extreme heat while working on a roof and while stressed and dehydrated Michael Livingston noticed increased heart rate and was found to have recurrent AF.  Michael Livingston saw Michael Livingston in the emergency room shortly after Michael Livingston presented and ventricular rate was in the 150s.  In the ER, Michael Livingston was successfully cardioverted since his AF had only been of several hours duration.  Michael Livingston was restarted back on Xarelto and Plavix and aspirin were discontinued.  Michael Livingston has not had any recurrent AF  since and has been on Toprol-XL 37.5 mg in the morning and had a prescription for as needed diltiazem 30 mg. Repeat catheterization was performed in August 2019 after Michael Livingston had developed symptoms of increasing shortness of breath and vague chest pressure.  Catheterization was essentially unchanged from previously and his LAD stent was widely patent.  Michael Livingston has been monitoring his blood pressures at home which had been becoming elevated in the 140s.  Michael Livingston was subsequently started on lisinopril and has now been taking 5 mg after initiating at 2.5 mg.  Michael Livingston brought with Michael Livingston his blood pressure recordings with his machine at home and these have been somewhat elevated than what was achieved in the office.  On exam today his blood pressure is stable.  There are no orthostatic changes.  Michael Livingston discussed with Michael Livingston optimal blood pressure is less than 120/80 and stage Michael Livingston hypertension begins at 130/80.  For this reason Michael Livingston have recommended continuation of lisinopril at 5 mg in addition to his Toprol-XL 37.5 mg daily.  Michael Livingston has not had any breakthrough AF.  Michael Livingston is on anticoagulation without bleeding.  Michael Livingston continues to be on Vytorin 10/40 for hyperlipidemia with target LDL less than 70.  Protonix has controlled GERD symptomatology.  Michael Livingston will see Michael Livingston in 6 months for reevaluation or sooner if problems arise.   Time spent: 25 minutes Troy Sine, MD, [facility]  07/28/2018 10:33 AM

## 2018-07-28 ENCOUNTER — Encounter: Payer: Self-pay | Admitting: Cardiovascular Disease

## 2018-07-31 ENCOUNTER — Other Ambulatory Visit: Payer: Self-pay

## 2018-07-31 MED ORDER — METFORMIN HCL ER 750 MG PO TB24: 750.0000 mg | ORAL_TABLET | Freq: Every day | ORAL | 0 refills | Status: DC

## 2018-07-31 NOTE — Addendum Note (Signed)
Addended by: Leland Johns A on: 07/31/2018 08:27 AM   Modules accepted: Orders

## 2018-08-07 ENCOUNTER — Telehealth: Payer: Self-pay | Admitting: Family Medicine

## 2018-08-07 NOTE — Telephone Encounter (Signed)
Patient called saying that [facility] hasn't received the prescription for Metformin 750mg 

## 2018-08-07 NOTE — Telephone Encounter (Signed)
Patient returned call to office and left voicemail message stating that he did pick up the [facility] of [facility]. Pt states in the future he would like for all prescriptions to be sent to [facility] due to insurance. Pharmacy updated in chart.

## 2018-08-07 NOTE — Telephone Encounter (Signed)
Called patient and [facility] [place]. Patient has set up a WebEx appointment on 08/13/18 with Dr. Elease Hashimoto. Patient verbalized an understanding for follow up DM visit.

## 2018-08-13 ENCOUNTER — Ambulatory Visit (INDEPENDENT_AMBULATORY_CARE_PROVIDER_SITE_OTHER): Payer: BLUE CROSS/BLUE SHIELD | Admitting: Family Medicine

## 2018-08-13 ENCOUNTER — Other Ambulatory Visit: Payer: Self-pay

## 2018-08-13 DIAGNOSIS — E785 Hyperlipidemia, unspecified: Secondary | ICD-10-CM | POA: Diagnosis not present

## 2018-08-13 DIAGNOSIS — I2510 Atherosclerotic heart disease of native coronary artery without angina pectoris: Secondary | ICD-10-CM | POA: Diagnosis not present

## 2018-08-13 DIAGNOSIS — J301 Allergic rhinitis due to pollen: Secondary | ICD-10-CM

## 2018-08-13 DIAGNOSIS — I480 Paroxysmal atrial fibrillation: Secondary | ICD-10-CM

## 2018-08-13 DIAGNOSIS — R0609 Other forms of dyspnea: Secondary | ICD-10-CM

## 2018-08-13 DIAGNOSIS — K219 Gastro-esophageal reflux disease without esophagitis: Secondary | ICD-10-CM

## 2018-08-13 DIAGNOSIS — E119 Type 2 diabetes mellitus without complications: Secondary | ICD-10-CM

## 2018-08-13 MED ORDER — METFORMIN HCL ER 750 MG PO TB24: 750.0000 mg | ORAL_TABLET | Freq: Every day | ORAL | 3 refills | Status: DC

## 2018-08-13 MED ORDER — EZETIMIBE-SIMVASTATIN 10-40 MG PO TABS: 1.0000 | ORAL_TABLET | Freq: Every day | ORAL | 1 refills | Status: DC

## 2018-08-13 MED ORDER — RIVAROXABAN 20 MG PO TABS: 20.0000 mg | ORAL_TABLET | Freq: Every day | ORAL | 1 refills | Status: DC

## 2018-08-13 MED ORDER — PANTOPRAZOLE SODIUM 40 MG PO TBEC: 40.0000 mg | DELAYED_RELEASE_TABLET | Freq: Every day | ORAL | 3 refills | Status: DC

## 2018-08-13 MED ORDER — DILTIAZEM HCL 30 MG PO TABS: 30.0000 mg | ORAL_TABLET | Freq: Every day | ORAL | 3 refills | Status: DC | PRN

## 2018-08-13 NOTE — Progress Notes (Signed)
Patient ID: Michael Livingston, male   DOB: [birth year], 58 y.o.   MRN: [id number]  Virtual Visit via Video Note  I connected with Michael Livingston on 08/13/18 at  9:00 AM EDT by a video enabled telemedicine application and verified that I am speaking with the correct person using two identifiers.  Location patient: home Location provider:work or home office Persons participating in the virtual visit: patient, provider  I discussed the limitations of evaluation and management by telemedicine and the availability of in person appointments. The patient expressed understanding and agreed to proceed.   HPI: Patient called today to discuss the following issues  Allergic rhinitis.  He tends to have seasonal allergies in the spring with pollen.  He takes 12-hour Allegra which usually works fairly well.  He has had only rare cough.  Some nasal congestion.  No fever.  No increased dyspnea above baseline.  Remote history of sarcoidosis.  Type 2 diabetes.  Last A1c 6.5%.  Recent fasting blood sugars fairly consistently less than 130.  He takes extended release metformin.  He has history of CAD and atrial fibrillation.  Remains on Xarelto.  He is on medications for rate control including metoprolol and diltiazem.  Needs refills of several medications.  He is overdue for lipids but we are waiting on labs at this point because of Covid 19 pandemic  He has history of reflux which is controlled with Protonix.  Needs refills.   ROS: See pertinent positives and negatives per HPI.  Past Medical History:  Diagnosis Date  . Allergy   . Atrial fibrillation ([place])    a. diagnosed in 01/2017 --> s/p DCCV. Started on Xarelto for anticoagulation.   Marland Kitchen CAD (coronary artery disease)    a. s/p DES to LAD in 2005 b. patent by cath in 2007 with 30% LCx stenosis noted at that time c. normal NST in 05/2011  . Chronic kidney disease    1 kidney stone  . Diabetes mellitus without complication (HCC)    type 2, no meds needed   . Hyperlipidemia   . Hypertension   . Myocardial infarction [facility])     Past Surgical History:  Procedure Laterality Date  . CARDIAC SURGERY  2005   stent  . CORONARY STENT PLACEMENT    . KNEE ARTHROSCOPY     left 2003, right 2005  . LEFT HEART CATH AND CORONARY ANGIOGRAPHY N/A 12/15/2017   Procedure: LEFT HEART CATH AND CORONARY ANGIOGRAPHY;  Surgeon: Troy Sine, MD;  Location: [place] [facility];  Service: Cardiovascular;  Laterality: N/A;  . SHOULDER SURGERY      Family History  Problem Relation Age of Onset  . Diabetes Mother   . Heart disease Father   . Diabetes Father   . Heart disease Paternal Grandfather   . Colon cancer Neg Hx   . Esophageal cancer Neg Hx   . Stomach cancer Neg Hx   . Rectal cancer Neg Hx     SOCIAL HX: Non-smoker.  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  diltiazem (CARDIZEM) 30 MG tablet, Take 1 tablet (30 mg total) by mouth daily as needed., Disp: 90 tablet, Rfl: 3 .  ezetimibe-simvastatin (VYTORIN) 10-40 MG tablet, Take 1 tablet by mouth daily., Disp: 90 tablet, Rfl: 1 .  famotidine (PEPCID) 20 MG tablet, One at bedtime, Disp:  90 tablet, Rfl: 0 .  lisinopril (PRINIVIL,ZESTRIL) 5 MG tablet, Take 1 tablet (5 mg total) by mouth daily., Disp: 90 tablet, Rfl: 3 .  metFORMIN (GLUCOPHAGE XR) 750 MG 24 hr tablet, Take 1 tablet (750 mg total) by mouth at bedtime., Disp: 90 tablet, Rfl: 3 .  metoprolol succinate (TOPROL XL) 25 MG 24 hr tablet, Take 1.5 tablets (37.5 mg total) by mouth daily., Disp: 270 tablet, Rfl: 3 .  Multiple Vitamins-Minerals (MULTIVITAMIN WITH MINERALS) tablet, Take 1 tablet by mouth at bedtime. , Disp: , Rfl:  .  nitroGLYCERIN (NITROSTAT) 0.4 MG SL tablet, Place 1 tablet (0.4 mg total) under the tongue every 5 (five) minutes as needed for chest pain., Disp: 25 tablet, Rfl: 2 .   pantoprazole (PROTONIX) 40 MG tablet, Take 1 tablet (40 mg total) by mouth daily. Take 30-60 min before first meal of the day, Disp: 90 tablet, Rfl: 3 .  PAZEO 0.7 % SOLN, INSTILL 1 DROP INTO INTO BOTH EYES EVERY MORNING AS NEEDED FOR ITCHING, Disp: , Rfl: 4 .  rivaroxaban (XARELTO) 20 MG TABS tablet, Take 1 tablet (20 mg total) by mouth daily with supper., Disp: 90 tablet, Rfl: 1  EXAM:  VITALS per patient if applicable:  GENERAL: alert, oriented, appears well and in no acute distress  HEENT: atraumatic, conjunttiva clear, no obvious abnormalities on inspection of external nose and ears  NECK: normal movements of the head and neck  LUNGS: on inspection no signs of respiratory distress, breathing rate appears normal, no obvious gross SOB, gasping or wheezing  CV: no obvious cyanosis  MS: moves all visible extremities without noticeable abnormality  PSYCH/NEURO: pleasant and cooperative, no obvious depression or anxiety, speech and thought processing grossly intact  ASSESSMENT AND PLAN:  Discussed the following assessment and plan:  #1 seasonal allergic rhinitis -Continue Allegra over-the-counter and add Flonase as needed  #2 type 2 diabetes.  History of good control.   -Refilled metformin for 1 year.  We will plan A1c at follow-up in 3 months  #3 history of paroxysmal atrial fibrillation -Refill Xarelto and diltiazem  #4 history of GERD which is currently stable on Protonix -Refilled Protonix for 1 year     I discussed the assessment and treatment plan with the patient. The patient was provided an opportunity to ask questions and all were answered. The patient agreed with the plan and demonstrated an understanding of the instructions.   The patient was advised to call back or seek an in-person evaluation if the symptoms worsen or if the condition fails to improve as anticipated.  Carolann Littler, MD

## 2018-08-27 ENCOUNTER — Other Ambulatory Visit: Payer: Self-pay | Admitting: Family Medicine

## 2018-08-27 DIAGNOSIS — R0609 Other forms of dyspnea: Principal | ICD-10-CM

## 2018-09-20 ENCOUNTER — Encounter: Payer: Self-pay | Admitting: Gastroenterology

## 2018-10-02 ENCOUNTER — Telehealth: Payer: Self-pay | Admitting: General Surgery

## 2018-10-02 ENCOUNTER — Encounter: Payer: Self-pay | Admitting: General Surgery

## 2018-10-02 NOTE — Telephone Encounter (Signed)
Left voicemail on patients home and mobile numbers to contact the office to pre-screen for virtual visit on 10/03/2018

## 2018-10-02 NOTE — Telephone Encounter (Signed)
Pt return call °

## 2018-10-03 ENCOUNTER — Other Ambulatory Visit: Payer: Self-pay

## 2018-10-03 ENCOUNTER — Ambulatory Visit (INDEPENDENT_AMBULATORY_CARE_PROVIDER_SITE_OTHER): Payer: BLUE CROSS/BLUE SHIELD | Admitting: Gastroenterology

## 2018-10-03 DIAGNOSIS — Z86010 Personal history of colonic polyps: Secondary | ICD-10-CM

## 2018-10-03 NOTE — Progress Notes (Signed)
Review of pertinent gastrointestinal problems: 1.  Personal history of precancerous colon polyps.  Colonoscopy 09/2013, Dr. Ardis Hughs.  Routine risk screening.  Removed 3 subcentimeter polyps, at least 1 of them was a sessile serrated polyp, the others were hyperplastic and he was recommended to have repeat colonoscopy at 5-year interval.  This service was provided via virtual visit.  We attempted audio and visual however the visual portion did not work and so we went with just audio in the end   the patient was located at home.  I was located in my office.  The patient did consent to this virtual visit and is aware of possible charges through their insurance for this visit.  The patient is an established patient.  My certified medical assistant, Grace Bushy, contributed to this visit by contacting the patient by phone 1 or 2 business days prior to the appointment and also followed up on the recommendations I made after the visit.  Time spent on virtual visit: 13 minutes   HPI: This is a very pleasant 58 year old man whom I last saw about 5 years ago at the time of a colonoscopy.  See those results summarized above  Since then he has had no trouble with GI issues.  Specifically no bleeding, no constipation, no diarrhea, no severe abdominal pains.  He does not have a family history of colon cancer  He  developed new onset atrial fibrillation around 2018.  He has been under the care of Dr. Ellouise Newer from cardiology and he is on Xarelto.  Coronary angiography August 2019 found normal L the function.  No significant residual coronary artery disease with overlapping stents in the proximal to mid LAD.  Chief complaint is personal history of precancerous colon polyps  ROS: complete GI ROS as described in HPI, all other review negative.  Constitutional:  No unintentional weight loss   Past Medical History:  Diagnosis Date  . Allergy   . Atrial fibrillation ([place])    a. diagnosed in 01/2017 -->  s/p DCCV. Started on Xarelto for anticoagulation.   Marland Kitchen CAD (coronary artery disease)    a. s/p DES to LAD in 2005 b. patent by cath in 2007 with 30% LCx stenosis noted at that time c. normal NST in 05/2011  . Chronic kidney disease    1 kidney stone  . Diabetes mellitus without complication (HCC)    type 2, no meds needed  . Hyperlipidemia   . Hypertension   . Myocardial infarction [facility])     Past Surgical History:  Procedure Laterality Date  . CARDIAC SURGERY  2005   stent  . CORONARY STENT PLACEMENT    . KNEE ARTHROSCOPY     left 2003, right 2005  . LEFT HEART CATH AND CORONARY ANGIOGRAPHY N/A 12/15/2017   Procedure: LEFT HEART CATH AND CORONARY ANGIOGRAPHY;  Surgeon: Troy Sine, MD;  Location: [place] [facility];  Service: Cardiovascular;  Laterality: N/A;  . SHOULDER SURGERY      Current Outpatient Medications  Medication Sig Dispense Refill  . diltiazem (CARDIZEM) 30 MG tablet Take 1 tablet (30 mg total) by mouth daily as needed. 90 tablet 3  . ezetimibe-simvastatin (VYTORIN) 10-40 MG tablet Take 1 tablet by mouth daily. 90 tablet 1  . famotidine (PEPCID) 20 MG tablet One at bedtime 90 tablet 0  . lisinopril (PRINIVIL,ZESTRIL) 5 MG tablet Take 1 tablet (5 mg total) by mouth daily. 90 tablet 3  . metFORMIN (GLUCOPHAGE XR) 750 MG 24 hr  tablet Take 1 tablet (750 mg total) by mouth at bedtime. 90 tablet 3  . metoprolol succinate (TOPROL XL) 25 MG 24 hr tablet Take 1.5 tablets (37.5 mg total) by mouth daily. 270 tablet 3  . Multiple Vitamins-Minerals (MULTIVITAMIN WITH MINERALS) tablet Take 1 tablet by mouth at bedtime.     . nitroGLYCERIN (NITROSTAT) 0.4 MG SL tablet Place 1 tablet (0.4 mg total) under the tongue every 5 (five) minutes as needed for chest pain. 25 tablet 2  . pantoprazole (PROTONIX) 40 MG tablet Take 1 tablet (40 mg total) by mouth daily. Take 30-60 min before first meal of the day 90 tablet 3  . PAZEO 0.7 % SOLN INSTILL 1 DROP INTO INTO BOTH EYES EVERY MORNING  AS NEEDED FOR ITCHING  4  . rivaroxaban (XARELTO) 20 MG TABS tablet Take 1 tablet (20 mg total) by mouth daily with supper. 90 tablet 1   No current facility-administered medications for this visit.     Allergies as of 10/03/2018 - Review Complete 10/02/2018  Allergen Reaction Noted  . Aleve [naproxen sodium] Swelling and Other (See Comments) 07/16/2012  . Oxycodone Other (See Comments) 10/10/2017  . Sulfa antibiotics Hives 07/16/2012  . Vicodin [hydrocodone-acetaminophen] Other (See Comments) 10/10/2017    Family History  Problem Relation Age of Onset  . Diabetes Mother   . Heart disease Father   . Diabetes Father   . Heart disease Paternal Grandfather   . Colon cancer Neg Hx   . Esophageal cancer Neg Hx   . Stomach cancer Neg Hx   . Rectal cancer Neg Hx     Social History   Socioeconomic History  . Marital status: Married    Spouse name: Not on file  . Number of children: Not on file  . Years of education: Not on file  . Highest education level: Not on file  Occupational History  . Not on file  Social Needs  . Financial resource strain: Not on file  . Food insecurity:    Worry: Not on file    Inability: Not on file  . Transportation needs:    Medical: Not on file    Non-medical: Not on file  Tobacco Use  . Smoking status: Former Smoker    Packs/day: 1.00    Years: 8.00    Pack years: 8.00    Last attempt to quit: 07/17/1987    Years since quitting: 31.2  . Smokeless tobacco: Never Used  Substance and Sexual Activity  . Alcohol use: No    Alcohol/week: 0.0 standard drinks  . Drug use: No  . Sexual activity: Not on file  Lifestyle  . Physical activity:    Days per week: Not on file    Minutes per session: Not on file  . Stress: Not on file  Relationships  . Social connections:    Talks on phone: Not on file    Gets together: Not on file    Attends religious service: Not on file    Active member of club or organization: Not on file    Attends meetings  of clubs or organizations: Not on file    Relationship status: Not on file  . Intimate partner violence:    Fear of current or ex partner: Not on file    Emotionally abused: Not on file    Physically abused: Not on file    Forced sexual activity: Not on file  Other Topics Concern  . Not on file  Social History  Narrative  . Not on file     Physical Exam: Unable to perform because this was a "telemed visit" due to current Covid-19 pandemic  Assessment and plan: 58 y.o. male with personal history of precancerous colon polyps  He is concerned about the pandemic, safety of coming into a health care facility.  I explained to him that I think that we are running a very tight ship here and that we would safely get him through colonoscopy for surveillance.  That being said I also let him know that I think it is absolutely biologically safe if he prefer to wait even several months for his surveillance colonoscopy and that is what he indeed prefers to do.  We agreed that my office will reach out to him in about 3 months and we can have the same discussion then, we might have much more insight into the nature of the virus, the pandemic, I would expect we will be having a hopefully point-of-care day of testing regimen by that point here as well.  Please see the "Patient Instructions" section for addition details about the plan.  Michael Loffler, MD [facility] 10/03/2018, 2:04 PM

## 2018-10-03 NOTE — Patient Instructions (Addendum)
He does not want to schedule his surveillance colonoscopy now due to concerns about the pandemic.  Please arrange return office visit with me, hopefully in person, in 3 months from now to go over this again, consider surveillance colonoscopy at that point.Our office will contact you in August to schedule an appointment  Thank you for entrusting me with your care and choosing [facility].  Dr Ardis Hughs

## 2018-10-12 ENCOUNTER — Ambulatory Visit (INDEPENDENT_AMBULATORY_CARE_PROVIDER_SITE_OTHER): Payer: BLUE CROSS/BLUE SHIELD | Admitting: Family Medicine

## 2018-10-12 ENCOUNTER — Encounter: Payer: Self-pay | Admitting: Family Medicine

## 2018-10-12 ENCOUNTER — Other Ambulatory Visit: Payer: Self-pay

## 2018-10-12 VITALS — BP 130/70 | HR 65 | Temp 97.8°F | Wt 212.0 lb

## 2018-10-12 DIAGNOSIS — E785 Hyperlipidemia, unspecified: Secondary | ICD-10-CM

## 2018-10-12 DIAGNOSIS — E119 Type 2 diabetes mellitus without complications: Secondary | ICD-10-CM

## 2018-10-12 DIAGNOSIS — E8881 Metabolic syndrome: Secondary | ICD-10-CM

## 2018-10-12 LAB — HEMOGLOBIN A1C: Hgb A1c MFr Bld: 6.5 % (ref 4.6–6.5)

## 2018-10-12 LAB — LIPID PANEL
Cholesterol: 112 mg/dL (ref 0–200)
HDL: 39.8 mg/dL (ref >39.00)
LDL Cholesterol: 53 mg/dL (ref 0–99)
NonHDL: 72.34
Total CHOL/HDL Ratio: 3
Triglycerides: 97 mg/dL (ref 0.0–149.0)
VLDL: 19.4 mg/dL (ref 0.0–40.0)

## 2018-10-12 LAB — MICROALBUMIN / CREATININE URINE RATIO
Creatinine,U: 88.3 mg/dL
Microalb Creat Ratio: 0.8 mg/g (ref 0.0–30.0)
Microalb, Ur: <0.7 mg/dL (ref 0.0–1.9)

## 2018-10-12 LAB — HEPATIC FUNCTION PANEL
ALT: 13 U/L (ref 0–53)
AST: 15 U/L (ref 0–37)
Albumin: 4.6 g/dL (ref 3.5–5.2)
Alkaline Phosphatase: 52 U/L (ref 39–117)
Bilirubin, Direct: 0.3 mg/dL (ref 0.0–0.3)
Total Bilirubin: 1.2 mg/dL (ref 0.2–1.2)
Total Protein: 7.5 g/dL (ref 6.0–8.3)

## 2018-10-12 MED ORDER — LISINOPRIL 2.5 MG PO TABS: 2.5000 mg | ORAL_TABLET | Freq: Every day | ORAL | 3 refills | Status: DC

## 2018-10-12 MED ORDER — FAMOTIDINE 20 MG PO TABS: 20.0000 mg | ORAL_TABLET | Freq: Two times a day (BID) | ORAL | 3 refills | Status: DC

## 2018-10-12 MED ORDER — MONTELUKAST SODIUM 10 MG PO TABS: 10.0000 mg | ORAL_TABLET | Freq: Every day | ORAL | 3 refills | Status: DC

## 2018-10-12 NOTE — Progress Notes (Signed)
Subjective:     Patient ID: Michael Livingston, male   DOB: November 13, 1960, 58 y.o.   MRN: [id number]  HPI Patient here for medical follow-up.  He has history of CAD, A. fib, pulmonary sarcoidosis, type 2 diabetes, dyslipidemia, and seasonal allergic rhinitis.  He already takes Allegra and sometimes a nasal spray and still has some breakthrough symptoms occasionally.  Especially with things like mowing.  Had some recent sneezing.  No fever.  No cough.  Has been allergy tested previously- allergic to many pollens.    He has history of A. fib.  Denies any recent chest pains or dyspnea.  He needs refills of famotidine which he takes for reflux and also lisinopril.  His blood sugars have been relatively stable.  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  Past Medical History:  Diagnosis Date  . Allergy   . Atrial fibrillation ([place])    a. diagnosed in 01/2017 --> s/p DCCV. Started on Xarelto for anticoagulation.   Marland Kitchen CAD (coronary artery disease)    a. s/p DES to LAD in 2005 b. patent by cath in 2007 with 30% LCx stenosis noted at that time c. normal NST in 05/2011  . Chronic kidney disease    1 kidney stone  . Diabetes mellitus without complication (HCC)    type 2, no meds needed  . Hyperlipidemia   . Hypertension   . Myocardial infarction [facility])    Past Surgical History:  Procedure Laterality Date  . CARDIAC SURGERY  2005   stent  . CORONARY STENT PLACEMENT    . KNEE ARTHROSCOPY     left 2003, right 2005  . LEFT HEART CATH AND CORONARY ANGIOGRAPHY N/A 12/15/2017   Procedure: LEFT HEART CATH AND CORONARY ANGIOGRAPHY;  Surgeon: Troy Sine, MD;  Location: [place] [facility];  Service: Cardiovascular;  Laterality: N/A;  . SHOULDER SURGERY      reports that he quit smoking about 31 years ago. He has a 8.00 pack-year smoking history. He has never used smokeless tobacco. He reports that he does not drink alcohol or use drugs. family history includes  Diabetes in his father and mother; Heart disease in his father and paternal grandfather. Allergies  Allergen Reactions  . Aleve [Naproxen Sodium] Swelling and Other (See Comments)    Mouth swells  . Oxycodone Other (See Comments)    Rendered the patient unable to fall asleep AND he "almost passed out"  . Sulfa Antibiotics Hives  . Vicodin [Hydrocodone-Acetaminophen] Other (See Comments)    Rendered the patient unable to fall asleep AND he "almost passed out"     Review of Systems  Constitutional: Negative for fatigue.  HENT: Positive for congestion and sneezing. Negative for sore throat.   Eyes: Negative for visual disturbance.  Respiratory: Negative for cough, chest tightness and shortness of breath.   Cardiovascular: Negative for chest pain, palpitations and leg swelling.  Endocrine: Negative for polydipsia and polyuria.  Neurological: Negative for dizziness, syncope, weakness, light-headedness and headaches.       Objective:   Physical Exam Constitutional:      Appearance: He is well-developed.  HENT:     Right Ear: External ear normal.     Left Ear: External ear normal.  Eyes:     Pupils: Pupils are equal, round, and reactive to light.  Neck:     Musculoskeletal: Neck supple.     Thyroid: No thyromegaly.  Cardiovascular:     Rate  and Rhythm: Normal rate and regular rhythm.  Pulmonary:     Effort: Pulmonary effort is normal. No respiratory distress.     Breath sounds: Normal breath sounds. No wheezing or rales.  Neurological:     Mental Status: He is alert and oriented to person, place, and time.        Assessment:     #1 history of type 2 diabetes well controlled  #2 GERD controlled with combination therapy of Protonix and H2 blocker  #3 history of atrial fibrillation currently sinus rhythm clinically  #4 dyslipidemia    Plan:     -Check labs with A1c, urine microalbumin, lipid, hepatic panel -Refilled famotidine and lisinopril for 1 year -Try  Singulair 10 mg once daily -Plan routine follow-up in 6 months and sooner as needed  Eulas Post MD [facility]

## 2018-10-28 ENCOUNTER — Other Ambulatory Visit: Payer: Self-pay | Admitting: Family Medicine

## 2018-11-05 ENCOUNTER — Telehealth: Payer: Self-pay

## 2018-11-05 NOTE — Telephone Encounter (Signed)
Copied from [place] [id number]. Topic: General - Inquiry >> Nov 05, 2018  2:40 PM Mathis Bud wrote: Reason for CRM: Patient is calling to request a call back from nurse or PCP. Patient did not want to disclose why. [phone]

## 2018-11-07 ENCOUNTER — Ambulatory Visit (INDEPENDENT_AMBULATORY_CARE_PROVIDER_SITE_OTHER): Payer: BC Managed Care – PPO | Admitting: Family Medicine

## 2018-11-07 ENCOUNTER — Other Ambulatory Visit: Payer: Self-pay

## 2018-11-07 DIAGNOSIS — N529 Male erectile dysfunction, unspecified: Secondary | ICD-10-CM | POA: Diagnosis not present

## 2018-11-07 DIAGNOSIS — R6882 Decreased libido: Secondary | ICD-10-CM

## 2018-11-07 MED ORDER — SILDENAFIL CITRATE 100 MG PO TABS: 50.0000 mg | ORAL_TABLET | Freq: Every day | ORAL | 3 refills | Status: DC | PRN

## 2018-11-07 NOTE — Progress Notes (Signed)
Patient ID: Michael Livingston, male   DOB: 02/28/1961, 58 y.o.   MRN: [id number]  This visit type was conducted due to national recommendations for restrictions regarding the COVID-19 pandemic in an effort to limit this patient's exposure and mitigate transmission in our community.   Virtual Visit via Video Note  I connected with [name] on 11/07/18 at 10:30 AM EDT by a video enabled telemedicine application and verified that I am speaking with the correct person using two identifiers.  Location patient: home Location provider:work or home office Persons participating in the virtual visit: patient, provider  I discussed the limitations of evaluation and management by telemedicine and the availability of in person appointments. The patient expressed understanding and agreed to proceed.   HPI:  Patient has chronic problems including history of CAD, atrial fibrillation, pulmonary sarcoidosis, type 2 diabetes, metabolic syndrome.  He is consulting today because of some recent problems with erectile dysfunction.  He is able to get erection but has poor quality and also decreased duration.  He has also noticed some decreased libido recently.  He does not take any SSRIs.  He has questions regarding several medications.  He is on diltiazem, Vytorin, lisinopril, metformin, Protonix, and Xarelto.  Does not smoke.  Diabetes has been well controlled with recent A1c 6.5%.  No recent chest pains.   ROS: See pertinent positives and negatives per HPI.  Past Medical History:  Diagnosis Date  . Allergy   . Atrial fibrillation ([place])    a. diagnosed in 01/2017 --> s/p DCCV. Started on Xarelto for anticoagulation.   Marland Kitchen CAD (coronary artery disease)    a. s/p DES to LAD in 2005 b. patent by cath in 2007 with 30% LCx stenosis noted at that time c. normal NST in 05/2011  . Chronic kidney disease    1 kidney stone  . Diabetes mellitus without complication (HCC)    type 2, no meds needed  . Hyperlipidemia    . Hypertension   . Myocardial infarction [facility])     Past Surgical History:  Procedure Laterality Date  . CARDIAC SURGERY  2005   stent  . CORONARY STENT PLACEMENT    . KNEE ARTHROSCOPY     left 2003, right 2005  . LEFT HEART CATH AND CORONARY ANGIOGRAPHY N/A 12/15/2017   Procedure: LEFT HEART CATH AND CORONARY ANGIOGRAPHY;  Surgeon: Troy Sine, MD;  Location: [facility];  Service: Cardiovascular;  Laterality: N/A;  . SHOULDER SURGERY      Family History  Problem Relation Age of Onset  . Diabetes Mother   . Heart disease Father   . Diabetes Father   . Heart disease Paternal Grandfather   . Colon cancer Neg Hx   . Esophageal cancer Neg Hx   . Stomach cancer Neg Hx   . Rectal cancer Neg Hx     SOCIAL HX: Non-smoker.  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  diltiazem (CARDIZEM) 30 MG tablet, Take 1 tablet (30 mg total) by mouth daily as needed., Disp: 90 tablet, Rfl: 3 .  ezetimibe-simvastatin (VYTORIN) 10-40 MG tablet, Take 1 tablet by mouth daily., Disp: 90 tablet, Rfl: 1 .  famotidine (PEPCID) 20 MG tablet, Take 1 tablet (20 mg total) by mouth 2 (two) times daily., Disp: 90 tablet, Rfl: 3 .  lisinopril (ZESTRIL) 2.5 MG tablet, Take 1 tablet (2.5 mg total) by mouth daily., Disp:  90 tablet, Rfl: 3 .  metFORMIN (GLUCOPHAGE XR) 750 MG 24 hr tablet, Take 1 tablet (750 mg total) by mouth at bedtime., Disp: 90 tablet, Rfl: 3 .  metoprolol succinate (TOPROL XL) 25 MG 24 hr tablet, Take 1.5 tablets (37.5 mg total) by mouth daily., Disp: 270 tablet, Rfl: 3 .  montelukast (SINGULAIR) 10 MG tablet, Take 1 tablet (10 mg total) by mouth at bedtime., Disp: 90 tablet, Rfl: 3 .  Multiple Vitamins-Minerals (MULTIVITAMIN WITH MINERALS) tablet, Take 1 tablet by mouth at bedtime. , Disp: , Rfl:  .  nitroGLYCERIN (NITROSTAT) 0.4 MG SL tablet, Place 1 tablet (0.4 mg total) under the tongue every 5 (five) minutes as  needed for chest pain., Disp: 25 tablet, Rfl: 2 .  pantoprazole (PROTONIX) 40 MG tablet, Take 1 tablet (40 mg total) by mouth daily. Take 30-60 min before first meal of the day, Disp: 90 tablet, Rfl: 3 .  PAZEO 0.7 % SOLN, INSTILL 1 DROP INTO INTO BOTH EYES EVERY MORNING AS NEEDED FOR ITCHING, Disp: , Rfl: 4 .  rivaroxaban (XARELTO) 20 MG TABS tablet, Take 1 tablet (20 mg total) by mouth daily with supper., Disp: 90 tablet, Rfl: 1 .  sildenafil (VIAGRA) 100 MG tablet, Take 0.5-1 tablets (50-100 mg total) by mouth daily as needed for erectile dysfunction., Disp: 15 tablet, Rfl: 3  EXAM:  VITALS per patient if applicable:  GENERAL: alert, oriented, appears well and in no acute distress  HEENT: atraumatic, conjunttiva clear, no obvious abnormalities on inspection of external nose and ears  NECK: normal movements of the head and neck  LUNGS: on inspection no signs of respiratory distress, breathing rate appears normal, no obvious gross SOB, gasping or wheezing  CV: no obvious cyanosis  MS: moves all visible extremities without noticeable abnormality  PSYCH/NEURO: pleasant and cooperative, no obvious depression or anxiety, speech and thought processing grossly intact  ASSESSMENT AND PLAN:  Discussed the following assessment and plan:  #1 erectile dysfunction and low libido-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  We discussed several issues as follows  -We explained that we could check early morning testosterone level but would not recommend starting any replacement even if he is low unless approved by cardiology  -We have not recommended stopping any of his regular medications unless cardiology approves.  -We discussed possible trial of Viagra.  He knows that he absolutely cannot mix this the same day with nitroglycerin.  He states he has not used nitroglycerin since 2005 and he knows that these cannot be mixed or they could cause profound hypotension    I discussed the  assessment and treatment plan with the patient. The patient was provided an opportunity to ask questions and all were answered. The patient agreed with the plan and demonstrated an understanding of the instructions.   The patient was advised to call back or seek an in-person evaluation if the symptoms worsen or if the condition fails to improve as anticipated.   Carolann Littler, MD

## 2018-11-07 NOTE — Telephone Encounter (Signed)
I have returned patient's call.

## 2018-11-11 ENCOUNTER — Emergency Department (HOSPITAL_BASED_OUTPATIENT_CLINIC_OR_DEPARTMENT_OTHER)
Admission: EM | Admit: 2018-11-11 | Discharge: 2018-11-11 | Disposition: A | Discharge status: Home / Self Care | Payer: BC Managed Care – PPO | Attending: Emergency Medicine | Admitting: Emergency Medicine

## 2018-11-11 ENCOUNTER — Other Ambulatory Visit: Payer: Self-pay

## 2018-11-11 ENCOUNTER — Emergency Department (HOSPITAL_BASED_OUTPATIENT_CLINIC_OR_DEPARTMENT_OTHER): Payer: BC Managed Care – PPO

## 2018-11-11 DIAGNOSIS — Y999 Unspecified external cause status: Secondary | ICD-10-CM | POA: Insufficient documentation

## 2018-11-11 DIAGNOSIS — Z7984 Long term (current) use of oral hypoglycemic drugs: Secondary | ICD-10-CM | POA: Insufficient documentation

## 2018-11-11 DIAGNOSIS — S79912A Unspecified injury of left hip, initial encounter: Secondary | ICD-10-CM | POA: Diagnosis present

## 2018-11-11 DIAGNOSIS — Y929 Unspecified place or not applicable: Secondary | ICD-10-CM | POA: Insufficient documentation

## 2018-11-11 DIAGNOSIS — W109XXA Fall (on) (from) unspecified stairs and steps, initial encounter: Secondary | ICD-10-CM | POA: Insufficient documentation

## 2018-11-11 DIAGNOSIS — Y9301 Activity, walking, marching and hiking: Secondary | ICD-10-CM | POA: Diagnosis not present

## 2018-11-11 DIAGNOSIS — I10 Essential (primary) hypertension: Secondary | ICD-10-CM | POA: Diagnosis not present

## 2018-11-11 DIAGNOSIS — S7002XA Contusion of left hip, initial encounter: Secondary | ICD-10-CM | POA: Diagnosis not present

## 2018-11-11 DIAGNOSIS — Z87891 Personal history of nicotine dependence: Secondary | ICD-10-CM | POA: Diagnosis not present

## 2018-11-11 DIAGNOSIS — Z7901 Long term (current) use of anticoagulants: Secondary | ICD-10-CM | POA: Insufficient documentation

## 2018-11-11 DIAGNOSIS — Z79899 Other long term (current) drug therapy: Secondary | ICD-10-CM | POA: Diagnosis not present

## 2018-11-11 DIAGNOSIS — I2510 Atherosclerotic heart disease of native coronary artery without angina pectoris: Secondary | ICD-10-CM | POA: Insufficient documentation

## 2018-11-11 NOTE — ED Triage Notes (Signed)
Pt c/o "knot on left hip" after falling this morning around 9am down steps. Pt verbalizes  Worry r/t taking xarelto.  Also c/o left elbow pain r/t fall.

## 2018-11-11 NOTE — ED Provider Notes (Signed)
[facility] Provider Note   CSN: [id number] Arrival date & time: 11/11/18  1350     History   Chief Complaint Chief Complaint  Patient presents with  . Hip Pain    HPI Michael Livingston is a 58 y.o. male.     HPI Patient is a 58 year old male with a history of paroxysmal atrial fibrillation on chronic anticoagulation who presents the emergency department because of swelling of his left lateral hip.  He states that this morning he was going down the wooden stairs when he tripped and fell and slid down onto his left hip and also.  He was able to go on to church and continue to ambulate without difficulty.  When he returned home from church he noticed left lateral hip swelling seems slightly worse thus he came to the emergency department given his use of anticoagulation (Xarelto).    Past Medical History:  Diagnosis Date  . Allergy   . Atrial fibrillation ([place])    a. diagnosed in 01/2017 --> s/p DCCV. Started on Xarelto for anticoagulation.   Marland Kitchen CAD (coronary artery disease)    a. s/p DES to LAD in 2005 b. patent by cath in 2007 with 30% LCx stenosis noted at that time c. normal NST in 05/2011  . Chronic kidney disease    1 kidney stone  . Diabetes mellitus without complication (HCC)    type 2, no meds needed  . Hyperlipidemia   . Hypertension   . Myocardial infarction [facility])     Patient Active Problem List   Diagnosis Date Noted  . Pulmonary sarcoidosis ([place]) 12/06/2017  . DOE (dyspnea on exertion) 12/05/2017  . Coronary artery disease involving native coronary artery of native heart with angina pectoris ([place])   . Paroxysmal atrial fibrillation ([place]) 01/30/2017  . Preoperative clearance 01/24/2015  . Dysmetabolic syndrome X 40/98/1191  . Hyperlipidemia with target LDL less than 70 04/02/2013  . Type 2 diabetes mellitus without complication, without long-term current use of insulin ([place]) 07/16/2012  . Other and unspecified hyperlipidemia  07/16/2012  . Coronary atherosclerosis 02/06/2009  . Generalized abdominal pain 02/06/2009    Past Surgical History:  Procedure Laterality Date  . CARDIAC SURGERY  2005   stent  . CORONARY STENT PLACEMENT    . KNEE ARTHROSCOPY     left 2003, right 2005  . LEFT HEART CATH AND CORONARY ANGIOGRAPHY N/A 12/15/2017   Procedure: LEFT HEART CATH AND CORONARY ANGIOGRAPHY;  Surgeon: Troy Sine, MD;  Location: [place];  Service: Cardiovascular;  Laterality: N/A;  . SHOULDER SURGERY          Home Medications    Prior to Admission medications   Medication Sig Start Date End Date Taking? Authorizing Provider  diltiazem (CARDIZEM) 30 MG tablet Take 1 tablet (30 mg total) by mouth daily as needed. 08/13/18 08/13/19  Burchette, Alinda Sierras, MD  ezetimibe-simvastatin (VYTORIN) 10-40 MG tablet Take 1 tablet by mouth daily. 08/13/18   Burchette, Alinda Sierras, MD  famotidine (PEPCID) 20 MG tablet Take 1 tablet (20 mg total) by mouth 2 (two) times daily. 10/12/18   Burchette, Alinda Sierras, MD  lisinopril (ZESTRIL) 2.5 MG tablet Take 1 tablet (2.5 mg total) by mouth daily. 10/12/18   Burchette, Alinda Sierras, MD  metFORMIN (GLUCOPHAGE XR) 750 MG 24 hr tablet Take 1 tablet (750 mg total) by mouth at bedtime. 08/13/18   Burchette, Alinda Sierras, MD  metoprolol succinate (TOPROL XL) 25 MG 24 hr  tablet Take 1.5 tablets (37.5 mg total) by mouth daily. 01/23/18   Troy Sine, MD  montelukast (SINGULAIR) 10 MG tablet Take 1 tablet (10 mg total) by mouth at bedtime. 10/12/18   Burchette, Alinda Sierras, MD  Multiple Vitamins-Minerals (MULTIVITAMIN WITH MINERALS) tablet Take 1 tablet by mouth at bedtime.     [provider]  nitroGLYCERIN (NITROSTAT) 0.4 MG SL tablet Place 1 tablet (0.4 mg total) under the tongue every 5 (five) minutes as needed for chest pain. 01/02/18   Duke, Tami Lin, PA  pantoprazole (PROTONIX) 40 MG tablet Take 1 tablet (40 mg total) by mouth daily. Take 30-60 min before first meal of the day 08/13/18    Burchette, Alinda Sierras, MD  PAZEO 0.7 % SOLN INSTILL 1 DROP INTO INTO BOTH EYES EVERY MORNING AS NEEDED FOR ITCHING 10/03/15   [provider]  rivaroxaban (XARELTO) 20 MG TABS tablet Take 1 tablet (20 mg total) by mouth daily with supper. 08/13/18   Burchette, Alinda Sierras, MD  sildenafil (VIAGRA) 100 MG tablet Take 0.5-1 tablets (50-100 mg total) by mouth daily as needed for erectile dysfunction. 11/07/18   Burchette, Alinda Sierras, MD    Family History Family History  Problem Relation Age of Onset  . Diabetes Mother   . Heart disease Father   . Diabetes Father   . Heart disease Paternal Grandfather   . Colon cancer Neg Hx   . Esophageal cancer Neg Hx   . Stomach cancer Neg Hx   . Rectal cancer Neg Hx     Social History Social History   Tobacco Use  . Smoking status: Former Smoker    Packs/day: 1.00    Years: 8.00    Pack years: 8.00    Quit date: 07/17/1987    Years since quitting: 31.3  . Smokeless tobacco: Never Used  Substance Use Topics  . Alcohol use: No    Alcohol/week: 0.0 standard drinks  . Drug use: No     Allergies   Aleve [naproxen sodium], Oxycodone, Sulfa antibiotics, and Vicodin [hydrocodone-acetaminophen]   Review of Systems Review of Systems  All other systems reviewed and are negative.    Physical Exam Updated Vital Signs BP 132/73 (BP Location: Right Arm)   Pulse 64   Temp 98.4 F (36.9 C) (Oral)   Resp 18   SpO2 97%   Physical Exam Vitals signs and nursing note reviewed.  Constitutional:      Appearance: He is well-developed.  HENT:     Head: Normocephalic and atraumatic.  Neck:     Musculoskeletal: Normal range of motion.  Cardiovascular:     Rate and Rhythm: Normal rate.  Pulmonary:     Effort: Pulmonary effort is normal. No respiratory distress.  Abdominal:     Palpations: Abdomen is soft.  Musculoskeletal: Normal range of motion.     Comments: Full range of motion of bilateral hips, knees, ankles.  Normal PT and DP pulse in left  foot.  Left lateral hip with evidence of swelling consistent with hematoma without overlying skin changes.  No erythema, warmth, or bruising.  Skin:    General: Skin is warm and dry.  Neurological:     Mental Status: He is alert and oriented to person, place, and time.  Psychiatric:        Judgment: Judgment normal.      ED Treatments / Results  Labs (all labs ordered are listed, but only abnormal results are displayed) Labs Reviewed - No data  to display  EKG None  Radiology No results found.  Procedures Procedures (including critical care time)  Medications Ordered in ED Medications - No data to display   Initial Impression / Assessment and Plan / ED Course  I have reviewed the triage vital signs and the nursing notes.  Pertinent labs & imaging results that were available during my care of the patient were reviewed by me and considered in my medical decision making (see chart for details).        Plain films pending.  Suspect soft tissue hematoma from chronic anticoagulation use.  Plan for ice and elevation..  Plan will be to hold his Xarelto for the next 2 doses.  Final Clinical Impressions(s) / ED Diagnoses   Final diagnoses:  None    ED Discharge Orders    None       Jola Schmidt, MD 11/11/18 1444

## 2018-11-11 NOTE — Discharge Instructions (Addendum)
Please hold your next two doses of Xarelto  Ice and Elevate

## 2018-11-11 NOTE — ED Notes (Signed)
ED Provider at bedside. 

## 2018-11-11 NOTE — ED Notes (Signed)
Patient transported to X-ray 

## 2018-11-12 ENCOUNTER — Telehealth: Payer: Self-pay | Admitting: Family Medicine

## 2018-11-12 ENCOUNTER — Other Ambulatory Visit (INDEPENDENT_AMBULATORY_CARE_PROVIDER_SITE_OTHER): Payer: BC Managed Care – PPO

## 2018-11-12 DIAGNOSIS — R6882 Decreased libido: Secondary | ICD-10-CM

## 2018-11-12 LAB — TESTOSTERONE: Testosterone: 354.66 ng/dL (ref 300.00–890.00)

## 2018-11-12 NOTE — Telephone Encounter (Signed)
Disregard

## 2018-11-13 NOTE — Telephone Encounter (Signed)
Disregard

## 2018-11-14 ENCOUNTER — Telehealth: Payer: Self-pay | Admitting: Cardiovascular Disease

## 2018-11-14 NOTE — Telephone Encounter (Signed)
Called patient, he states he just had lab work through his PCP and it was great- he is wondering if he could come off of some of his medications, as he has not felt like himself the past year and wants to make some changes.  Advised patient Dr.Kelly was out of office until Monday- and would call him back once I heard from him. Patient verbalized understanding.

## 2018-11-14 NOTE — Telephone Encounter (Signed)
Patient would like to know if he could start reducing or eliminating some of his cholesterol medications

## 2018-11-20 NOTE — Telephone Encounter (Signed)
New Message    Patient would like a call back in regards to his questions he spoke to you about.

## 2018-11-20 NOTE — Telephone Encounter (Signed)
Called and notified patient that Dr.Kelly was in yesterday- but had emergency meeting yesterday afternoon and caused him to not respond to messages- I advised patient I would call back as soon as I got an answer, patient was thankful for the call, and will await my return call.

## 2018-11-22 MED ORDER — EZETIMIBE-SIMVASTATIN 10-20 MG PO TABS: 1.0000 | ORAL_TABLET | Freq: Every day | ORAL | 1 refills | Status: DC

## 2018-11-22 NOTE — Telephone Encounter (Signed)
mychart message sent to patient regarding information. Medication changed.

## 2018-11-22 NOTE — Addendum Note (Signed)
Addended by: Caprice Beaver T on: 11/22/2018 02:10 PM   Modules accepted: Orders

## 2018-11-26 ENCOUNTER — Telehealth: Payer: Self-pay

## 2018-11-26 DIAGNOSIS — Z125 Encounter for screening for malignant neoplasm of prostate: Secondary | ICD-10-CM

## 2018-11-26 NOTE — Telephone Encounter (Signed)
Patient stated that he wants a really good doctor and asked who Dr. Elease Hashimoto would be a good choice? Please advise.

## 2018-11-26 NOTE — Telephone Encounter (Signed)
Copied from [place] [id number]. Topic: Referral - Request for Referral >> Nov 26, 2018 11:41 AM Parke Poisson wrote: Has patient seen PCP for this complaint?  *If NO, is insurance requiring patient see PCP for this issue before PCP can refer them? Referral for which specialty:Urology Preferred provider/office: No preference. Wants advice from Dr Elease Hashimoto  Reason for referral: Wants to establish with urologist for prostate check. No issues per pt

## 2018-11-26 NOTE — Telephone Encounter (Signed)
Could recommend Dr Raynelle Bring or Dr Franchot Gallo with [facility].

## 2018-11-27 NOTE — Telephone Encounter (Signed)
Referral placed, patient is aware.  

## 2019-01-04 ENCOUNTER — Telehealth: Payer: Self-pay | Admitting: *Deleted

## 2019-01-04 ENCOUNTER — Ambulatory Visit: Payer: BC Managed Care – PPO | Admitting: Gastroenterology

## 2019-01-04 ENCOUNTER — Encounter: Payer: Self-pay | Admitting: Gastroenterology

## 2019-01-04 VITALS — BP 110/60 | HR 76 | Temp 97.8°F | Ht 73.0 in | Wt 203.0 lb

## 2019-01-04 DIAGNOSIS — Z860100 Personal history of colon polyps, unspecified: Secondary | ICD-10-CM

## 2019-01-04 DIAGNOSIS — R1013 Epigastric pain: Secondary | ICD-10-CM | POA: Diagnosis not present

## 2019-01-04 DIAGNOSIS — R1310 Dysphagia, unspecified: Secondary | ICD-10-CM | POA: Diagnosis not present

## 2019-01-04 DIAGNOSIS — R634 Abnormal weight loss: Secondary | ICD-10-CM | POA: Diagnosis not present

## 2019-01-04 MED ORDER — PEG 3350-KCL-NA BICARB-NACL 420 G PO SOLR: 4000.0000 mL | Freq: Once | ORAL | 0 refills | Status: AC

## 2019-01-04 NOTE — Telephone Encounter (Signed)
Request for surgical clearance:     Endoscopy Procedure  What type of surgery is being performed?     Endoscopy/colonosopy  When is this surgery scheduled?     01/11/2019  What type of clearance is required ?   Pharmacy  Are there any medications that need to be held prior to surgery and how long? Xarelto, 2 days  Practice name and name of physician performing surgery?      [facility]  What is your office phone and fax number?      Phone- [phone]  [phone]  Anesthesia type (None, local, MAC, general) ?       MAC

## 2019-01-04 NOTE — Progress Notes (Signed)
Review of pertinent gastrointestinal problems: 1.  Personal history of precancerous colon polyps.  Colonoscopy 09/2013, Dr. Ardis Hughs.  Routine risk screening.  Removed 3 subcentimeter polyps, at least 1 of them was a sessile serrated polyp, the others were hyperplastic and he was recommended to have repeat colonoscopy at 5-year interval.   HPI: This is a very pleasant 58 year old man whom I last saw via telemedicine visit a few months ago to discuss repeat colonoscopy for history of precancerous colon polyps.  He is now ready to go ahead and schedule that colonoscopy.  He also brought up some dyspepsia and belching that he has been having over the past several months.  It happens despite proton pump inhibitor every morning and H2 blocker at bedtime.  He has chronic intermittent dysphasia, going on for years.  He has lost probably 8 to 10 pounds in the past 6 months or so.  Chief complaint is history of colon polyps, dysphasia  ROS: complete GI ROS as described in HPI, all other review negative.  Constitutional:  No unintentional weight loss   Past Medical History:  Diagnosis Date  . Allergy   . Atrial fibrillation ([place])    a. diagnosed in 01/2017 --> s/p DCCV. Started on Xarelto for anticoagulation.   Marland Kitchen CAD (coronary artery disease)    a. s/p DES to LAD in 2005 b. patent by cath in 2007 with 30% LCx stenosis noted at that time c. normal NST in 05/2011  . Chronic kidney disease    1 kidney stone  . Diabetes mellitus without complication (HCC)    type 2, no meds needed  . Hyperlipidemia   . Hypertension   . Myocardial infarction [facility])     Past Surgical History:  Procedure Laterality Date  . CARDIAC SURGERY  2005   stent  . CORONARY STENT PLACEMENT    . KNEE ARTHROSCOPY     left 2003, right 2005  . LEFT HEART CATH AND CORONARY ANGIOGRAPHY N/A 12/15/2017   Procedure: LEFT HEART CATH AND CORONARY ANGIOGRAPHY;  Surgeon: Troy Sine, MD;  Location: [place];  Service:  Cardiovascular;  Laterality: N/A;  . SHOULDER SURGERY      Current Outpatient Medications  Medication Sig Dispense Refill  . diltiazem (CARDIZEM) 30 MG tablet Take 1 tablet (30 mg total) by mouth daily as needed. 90 tablet 3  . ezetimibe-simvastatin (VYTORIN) 10-20 MG tablet Take 1 tablet by mouth daily. 90 tablet 1  . famotidine (PEPCID) 20 MG tablet Take 1 tablet (20 mg total) by mouth 2 (two) times daily. 90 tablet 3  . lisinopril (ZESTRIL) 2.5 MG tablet Take 1 tablet (2.5 mg total) by mouth daily. 90 tablet 3  . metFORMIN (GLUCOPHAGE XR) 750 MG 24 hr tablet Take 1 tablet (750 mg total) by mouth at bedtime. 90 tablet 3  . metoprolol succinate (TOPROL XL) 25 MG 24 hr tablet Take 1.5 tablets (37.5 mg total) by mouth daily. 270 tablet 3  . montelukast (SINGULAIR) 10 MG tablet Take 1 tablet (10 mg total) by mouth at bedtime. 90 tablet 3  . Multiple Vitamins-Minerals (MULTIVITAMIN WITH MINERALS) tablet Take 1 tablet by mouth at bedtime.     . nitroGLYCERIN (NITROSTAT) 0.4 MG SL tablet Place 1 tablet (0.4 mg total) under the tongue every 5 (five) minutes as needed for chest pain. 25 tablet 2  . pantoprazole (PROTONIX) 40 MG tablet Take 1 tablet (40 mg total) by mouth daily. Take 30-60 min before first meal of the day  90 tablet 3  . PAZEO 0.7 % SOLN INSTILL 1 DROP INTO INTO BOTH EYES EVERY MORNING AS NEEDED FOR ITCHING  4  . rivaroxaban (XARELTO) 20 MG TABS tablet Take 1 tablet (20 mg total) by mouth daily with supper. 90 tablet 1  . sildenafil (VIAGRA) 100 MG tablet Take 0.5-1 tablets (50-100 mg total) by mouth daily as needed for erectile dysfunction. 15 tablet 3   No current facility-administered medications for this visit.     Allergies as of 01/04/2019 - Review Complete 01/04/2019  Allergen Reaction Noted  . Aleve [naproxen sodium] Swelling and Other (See Comments) 07/16/2012  . Oxycodone Other (See Comments) 10/10/2017  . Sulfa antibiotics Hives 07/16/2012  . Vicodin  [hydrocodone-acetaminophen] Other (See Comments) 10/10/2017    Family History  Problem Relation Age of Onset  . Diabetes Mother   . Heart disease Father   . Diabetes Father   . Heart disease Paternal Grandfather   . Colon cancer Neg Hx   . Esophageal cancer Neg Hx   . Stomach cancer Neg Hx   . Rectal cancer Neg Hx     Social History   Socioeconomic History  . Marital status: Married    Spouse name: Not on file  . Number of children: Not on file  . Years of education: Not on file  . Highest education level: Not on file  Occupational History  . Not on file  Social Needs  . Financial resource strain: Not on file  . Food insecurity    Worry: Not on file    Inability: Not on file  . Transportation needs    Medical: Not on file    Non-medical: Not on file  Tobacco Use  . Smoking status: Former Smoker    Packs/day: 1.00    Years: 8.00    Pack years: 8.00    Quit date: 07/17/1987    Years since quitting: 31.4  . Smokeless tobacco: Never Used  Substance and Sexual Activity  . Alcohol use: No    Alcohol/week: 0.0 standard drinks  . Drug use: No  . Sexual activity: Not on file  Lifestyle  . Physical activity    Days per week: Not on file    Minutes per session: Not on file  . Stress: Not on file  Relationships  . Social Herbalist on phone: Not on file    Gets together: Not on file    Attends religious service: Not on file    Active member of club or organization: Not on file    Attends meetings of clubs or organizations: Not on file    Relationship status: Not on file  . Intimate partner violence    Fear of current or ex partner: Not on file    Emotionally abused: Not on file    Physically abused: Not on file    Forced sexual activity: Not on file  Other Topics Concern  . Not on file  Social History Narrative  . Not on file     Physical Exam: Temp 97.8 F (36.6 C)   Ht 6\' 1"  (1.854 m) Comment: height measured without shoes  Wt 203 lb (92.1 kg)    BMI 26.78 kg/m  Constitutional: generally well-appearing Psychiatric: alert and oriented x3 Abdomen: soft, nontender, nondistended, no obvious ascites, no peritoneal signs, normal bowel sounds No peripheral edema noted in lower extremities  Assessment and plan: 58 y.o. male with history of colon polyps, dyspepsia, dysphasia, weight loss  First  he needs a colonoscopy for history of precancerous colon polyps.  He will need to hold his Xarelto for 2 days prior we will contact his cardiologist to make sure they feel that is a safe recommendation for him.  At the same time I think looking in his stomach with an upper endoscopy is a very good idea given his dyspeptic symptoms, belching.  He also has chronic dysphasia and some recent weight loss to give me some concern about possible underlying neoplasm.  Please see the "Patient Instructions" section for addition details about the plan.  Owens Loffler, MD [facility] 01/04/2019, 2:40 PM

## 2019-01-04 NOTE — Patient Instructions (Addendum)
You have been scheduled for an endoscopy/colonoscopy. Please follow written instructions given to you at your visit today.  Please pick up your prep supplies at the pharmacy within the next 1-3 days. If you use inhalers (even only as needed), please bring them with you on the day of your procedure.  You will be contacted by our office prior to your procedure for directions on holding your Xarelto.  If you do not hear from our office 1 week prior to your scheduled procedure, please call [phone] to discuss.   If you are age 51 or younger, your body mass index should be between 19-25. Your Body mass index is 26.78 kg/m. If this is out of the aformentioned range listed, please consider follow up with your Primary Care Provider.

## 2019-01-06 NOTE — Telephone Encounter (Signed)
Clinical pharmacist to review how long to hold Xarelto

## 2019-01-07 NOTE — Telephone Encounter (Signed)
   Primary Cardiologist: Shelva Majestic, MD  Chart reviewed as part of pre-operative protocol coverage. Given past medical history and time since last visit, based on ACC/AHA guidelines, Michael Livingston would be at acceptable risk for the planned procedure without further cardiovascular testing.   Per office protocol, patient can hold XARELTO for 2 days prior to procedure.   I will route this recommendation to the requesting party via Epic fax function and remove from pre-op pool.  Please call with questions.  Lyda Jester, PA-C 01/07/2019, 8:10 AM

## 2019-01-07 NOTE — Telephone Encounter (Signed)
Patient with diagnosis of Atrial Fibrillation on XARELTO for anticoagulation.    Procedure: ENDOSCOPY/COLONOSCOPY Date of procedure: 01/11/2019  CHADS2-VASc score of  3 (HTN,DM2,CAD)  CrCl = 99 ML/MIN Platelet count = 199  Per office protocol, patient can hold XARELTO for 2 days prior to procedure.

## 2019-01-07 NOTE — Telephone Encounter (Signed)
I have spoken to patient to advise that per cardiology, he should hold xarelto 2 days prior to procedure. He verbalizes understanding of this.

## 2019-01-08 ENCOUNTER — Encounter: Payer: Self-pay | Admitting: Gastroenterology

## 2019-01-10 ENCOUNTER — Telehealth: Payer: Self-pay

## 2019-01-10 NOTE — Telephone Encounter (Signed)
Covid-19 screening questions   Do you now or have you had a fever in the last 14 days? NO   Do you have any respiratory symptoms of shortness of breath or cough now or in the last 14 days? NO  Do you have any family members or close contacts with diagnosed or suspected Covid-19 in the past 14 days? NO  Have you been tested for Covid-19 and found to be positive? NO        

## 2019-01-11 ENCOUNTER — Encounter: Payer: Self-pay | Admitting: Gastroenterology

## 2019-01-11 ENCOUNTER — Other Ambulatory Visit: Payer: Self-pay

## 2019-01-11 ENCOUNTER — Ambulatory Visit (AMBULATORY_SURGERY_CENTER): Payer: BC Managed Care – PPO | Admitting: Gastroenterology

## 2019-01-11 VITALS — BP 124/67 | HR 53 | Temp 97.8°F | Resp 13 | Ht 73.0 in | Wt 203.0 lb

## 2019-01-11 DIAGNOSIS — K2950 Unspecified chronic gastritis without bleeding: Secondary | ICD-10-CM

## 2019-01-11 DIAGNOSIS — Z86010 Personal history of colonic polyps: Secondary | ICD-10-CM | POA: Diagnosis not present

## 2019-01-11 DIAGNOSIS — K2970 Gastritis, unspecified, without bleeding: Secondary | ICD-10-CM

## 2019-01-11 DIAGNOSIS — R1310 Dysphagia, unspecified: Secondary | ICD-10-CM

## 2019-01-11 MED ORDER — SODIUM CHLORIDE 0.9 % IV SOLN: 500.0000 mL | Freq: Once | INTRAVENOUS | Status: DC

## 2019-01-11 NOTE — Patient Instructions (Signed)
Thank you for allowing [place] to care for you today!  Await pathology results of upper endoscopy by mail, approximately 2 weeks.  Recommend next screening colonoscopy in 10 years.  Resume previous diet today  Resume your Xarelto and other medications today.  Return to your normal activities tomorrow.      YOU HAD AN ENDOSCOPIC PROCEDURE TODAY AT [facility]:   Refer to the procedure report that was given to you for any specific questions about what was found during the examination.  If the procedure report does not answer your questions, please call your gastroenterologist to clarify.  If you requested that your care partner not be given the details of your procedure findings, then the procedure report has been included in a sealed envelope for you to review at your convenience later.  YOU SHOULD EXPECT: Some feelings of bloating in the abdomen. Passage of more gas than usual.  Walking can help get rid of the air that was put into your GI tract during the procedure and reduce the bloating. If you had a lower endoscopy (such as a colonoscopy or flexible sigmoidoscopy) you may notice spotting of blood in your stool or on the toilet paper. If you underwent a bowel prep for your procedure, you may not have a normal bowel movement for a few days.  Please Note:  You might notice some irritation and congestion in your nose or some drainage.  This is from the oxygen used during your procedure.  There is no need for concern and it should clear up in a day or so.  SYMPTOMS TO REPORT IMMEDIATELY:   Following lower endoscopy (colonoscopy or flexible sigmoidoscopy):  Excessive amounts of blood in the stool  Significant tenderness or worsening of abdominal pains  Swelling of the abdomen that is new, acute  Fever of 100F or higher   Following upper endoscopy (EGD)  Vomiting of blood or coffee ground material  New chest pain or pain under the shoulder blades  Painful or  persistently difficult swallowing  New shortness of breath  Fever of 100F or higher  Black, tarry-looking stools  For urgent or emergent issues, a gastroenterologist can be reached at any hour by calling [phone].   DIET:  We do recommend a small meal at first, but then you may proceed to your regular diet.  Drink plenty of fluids but you should avoid alcoholic beverages for 24 hours.  ACTIVITY:  You should plan to take it easy for the rest of today and you should NOT DRIVE or use heavy machinery until tomorrow (because of the sedation medicines used during the test).    FOLLOW UP: Our staff will call the number listed on your records 48-72 hours following your procedure to check on you and address any questions or concerns that you may have regarding the information given to you following your procedure. If we do not reach you, we will leave a message.  We will attempt to reach you two times.  During this call, we will ask if you have developed any symptoms of COVID 19. If you develop any symptoms (ie: fever, flu-like symptoms, shortness of breath, cough etc.) before then, please call [phone].  If you test positive for Covid 19 in the 2 weeks post procedure, please call and report this information to [place].    If any biopsies were taken you will be contacted by phone or by letter within the next 1-3 weeks.  Please call us at [phone]  [phone] if you have not heard about the biopsies in 3 weeks.    SIGNATURES/CONFIDENTIALITY: You and/or your care partner have signed paperwork which will be entered into your electronic medical record.  These signatures attest to the fact that that the information above on your After Visit Summary has been reviewed and is understood.  Full responsibility of the confidentiality of this discharge information lies with you and/or your care-partner.

## 2019-01-11 NOTE — Progress Notes (Signed)
Called to room to assist during endoscopic procedure.  Patient ID and intended procedure confirmed with present staff. Received instructions for my participation in the procedure from the performing physician.  

## 2019-01-11 NOTE — Progress Notes (Signed)
June temp Courtney vitals 

## 2019-01-11 NOTE — Op Note (Signed)
[facility] Patient Name: Michael Livingston Procedure Date: 01/11/2019 1:53 PM MRN: [id number] Endoscopist: Milus Banister , MD Age: 58 Referring MD:  Date of Birth: 11-30-1960 Gender: Male Account #: [id number] Procedure:                Colonoscopy Indications:              High risk colon cancer surveillance: Personal                            history of colonic polyps; Colonoscopy 09/2013, Dr.                            Ardis Hughs. Routine risk screening. Removed 3                            subcentimeter polyps, at least 1 of them was a                            sessile serrated polyp,the others were                            hyperplastic and he was recommended to have repeat                            colonoscopy at 5-year interval. Medicines:                Monitored Anesthesia Care Procedure:                Pre-Anesthesia Assessment:                           - Prior to the procedure, a History and Physical                            was performed, and patient medications and                            allergies were reviewed. The patient's tolerance of                            previous anesthesia was also reviewed. The risks                            and benefits of the procedure and the sedation                            options and risks were discussed with the patient.                            All questions were answered, and informed consent                            was obtained. Prior Anticoagulants: The patient has  taken Xarelto (rivaroxaban), last dose was 2 days                            prior to procedure. ASA Grade Assessment: III - A                            patient with severe systemic disease. After                            reviewing the risks and benefits, the patient was                            deemed in satisfactory condition to undergo the                            procedure.                           After  obtaining informed consent, the colonoscope                            was passed under direct vision. Throughout the                            procedure, the patient's blood pressure, pulse, and                            oxygen saturations were monitored continuously. The                            Colonoscope was introduced through the anus and                            advanced to the the cecum, identified by                            appendiceal orifice and ileocecal valve. The                            colonoscopy was performed without difficulty. The                            patient tolerated the procedure well. The quality                            of the bowel preparation was good. The ileocecal                            valve, appendiceal orifice, and rectum were                            photographed. Scope In: 2:11:56 PM Scope Out: 2:22:41 PM Scope Withdrawal Time: 0 hours 7 minutes 44 seconds  Total Procedure Duration: 0 hours 10 minutes 45 seconds  Findings:                 The entire examined colon appeared normal on direct                            and retroflexion views. Complications:            No immediate complications. Estimated blood loss:                            None. Estimated Blood Loss:     Estimated blood loss: none. Impression:               - The entire examined colon is normal on direct and                            retroflexion views.                           - No specimens collected. Recommendation:           - EGD now for dyspepsia, dysphagia.                           - Repeat colonoscopy in 10 years for screening. Milus Banister, MD 01/11/2019 2:24:54 PM This report has been signed electronically.

## 2019-01-11 NOTE — Progress Notes (Signed)
Report to PACU, RN, vss, BBS= Clear.  

## 2019-01-11 NOTE — Op Note (Signed)
[facility] Patient Name: Michael Livingston Procedure Date: 01/11/2019 1:52 PM MRN: [id number] Endoscopist: Milus Banister , MD Age: 58 Referring MD:  Date of Birth: 06-Nov-1960 Gender: Male Account #: [id number] Procedure:                Upper GI endoscopy Indications:              Dyspepsia, Dysphagia Medicines:                Monitored Anesthesia Care Procedure:                Pre-Anesthesia Assessment:                           - Prior to the procedure, a History and Physical                            was performed, and patient medications and                            allergies were reviewed. The patient's tolerance of                            previous anesthesia was also reviewed. The risks                            and benefits of the procedure and the sedation                            options and risks were discussed with the patient.                            All questions were answered, and informed consent                            was obtained. Prior Anticoagulants: The patient has                            taken Xarelto (rivaroxaban), last dose was 2 days                            prior to procedure. ASA Grade Assessment: III - A                            patient with severe systemic disease. After                            reviewing the risks and benefits, the patient was                            deemed in satisfactory condition to undergo the                            procedure.  After obtaining informed consent, the endoscope was                            passed under direct vision. Throughout the                            procedure, the patient's blood pressure, pulse, and                            oxygen saturations were monitored continuously. The                            Endoscope was introduced through the mouth, and                            advanced to the second part of duodenum. The upper         GI endoscopy was accomplished without difficulty.                            The patient tolerated the procedure well. Scope In: Scope Out: Findings:                 Mild inflammation characterized by erythema and                            friability was found in the gastric antrum.                            Biopsies were taken with a cold forceps for                            histology.                           A 3 cm hiatal hernia was present with resultant                            tortuous esophagus.                           The exam was otherwise without abnormality. Complications:            No immediate complications. Estimated blood loss:                            None. Estimated Blood Loss:     Estimated blood loss: none. Impression:               - Mild gastritis, biopsied to check for H. pylori.                           - 3 cm hiatal hernia.                           - The examination was otherwise normal. Recommendation:           -  Patient has a contact number available for                            emergencies. The signs and symptoms of potential                            delayed complications were discussed with the                            patient. Return to normal activities tomorrow.                            Written discharge instructions were provided to the                            patient.                           - Resume previous diet.                           - Continue present medications.                           - Await pathology results. Milus Banister, MD 01/11/2019 2:47:31 PM This report has been signed electronically.

## 2019-01-15 ENCOUNTER — Telehealth: Payer: Self-pay | Admitting: Family Medicine

## 2019-01-15 NOTE — Telephone Encounter (Signed)
Please see message. °

## 2019-01-15 NOTE — Telephone Encounter (Signed)
Pt called and states he got a letter from [place] that the metFORMIN (GLUCOPHAGE XR) 750 MG 24 hr tablet has been recalled.  Pt was advised to call PCP to find out what he needs to do.

## 2019-01-15 NOTE — Telephone Encounter (Signed)
We need to see if he was intolerant to immediate release Metformin.  If not, we could switch to Metformin 500 mg po bid until shortage issue remedied.

## 2019-01-16 ENCOUNTER — Telehealth: Payer: Self-pay | Admitting: *Deleted

## 2019-01-16 NOTE — Telephone Encounter (Signed)
  Follow up Call-  Call back number 01/11/2019  Post procedure Call Back phone  # [phone]  Permission to leave phone message Yes  Some recent data might be hidden    [facility]

## 2019-01-16 NOTE — Telephone Encounter (Signed)
No answer for post follow up second call. Left message for patient to call with questions or concerns. SM

## 2019-01-16 NOTE — Telephone Encounter (Deleted)
1. Have you developed a fever since your procedure? no  2.   Have you had an respiratory symptoms (SOB or cough) since your procedure? no  3.   Have you tested positive for COVID 19 since your procedure no  4.   Have you had any family members/close contacts diagnosed with the COVID 19 since your procedure?  no   If yes to any of these questions please route to Joylene John, RN and Alphonsa Gin, Therapist, sports.     Follow up Call-  Call back number 01/11/2019  Post procedure Call Back phone  # [phone]  Permission to leave phone message Yes  Some recent data might be hidden     Patient questions:  Do you have a fever, pain , or abdominal swelling? No. Pain Score  0 *  Have you tolerated food without any problems? Yes.    Have you been able to return to your normal activities? Yes.    Do you have any questions about your discharge instructions: Diet   No. Medications  No. Follow up visit  No.  Do you have questions or concerns about your Care? No.  Actions: * If pain score is 4 or above: No action needed, pain <4.

## 2019-01-17 MED ORDER — METFORMIN HCL 500 MG PO TABS: 500.0000 mg | ORAL_TABLET | Freq: Two times a day (BID) | ORAL | 0 refills | Status: DC

## 2019-01-17 NOTE — Telephone Encounter (Signed)
Patient states he did not have a problem with the immediate release. rx sent.

## 2019-01-18 ENCOUNTER — Encounter: Payer: Self-pay | Admitting: Gastroenterology

## 2019-02-12 ENCOUNTER — Telehealth: Payer: Self-pay

## 2019-02-12 DIAGNOSIS — E119 Type 2 diabetes mellitus without complications: Secondary | ICD-10-CM

## 2019-02-12 NOTE — Telephone Encounter (Signed)
Copied from [place] [phone]. Topic: General - Other >> Feb 12, 2019 10:09 AM Antonieta Iba C wrote: Reason for CRM: pt called in requesting a referral to a diabetes specialist.   [id number]- please assist pt directly.

## 2019-02-13 NOTE — Telephone Encounter (Signed)
Ok for referral?

## 2019-02-13 NOTE — Telephone Encounter (Signed)
We can refer- although his blood sugars have been pretty stable.  If he would like to see endocrinologist can offer [facility].

## 2019-02-20 NOTE — Telephone Encounter (Signed)
Attempted to reach patient. No answer. Vm left to call back  

## 2019-02-22 NOTE — Telephone Encounter (Signed)
Pt would like a referral to [facility], Dr Chalmers Cater. Pt states he would like to be a little more proactive in his dm, and discuss lifestyle, diet and exercise changes.   Pt would like you to send asap so he can make appt.

## 2019-02-25 ENCOUNTER — Telehealth: Payer: Self-pay | Admitting: *Deleted

## 2019-02-25 NOTE — Telephone Encounter (Signed)
Offer virtual.  Would need to discuss.

## 2019-02-25 NOTE — Telephone Encounter (Signed)
Spoke with pt and he was ok with a doxy visit. This was scheduled for this Wednesday. Nothing further is needed

## 2019-02-25 NOTE — Telephone Encounter (Signed)
Copied from [place] [id number]. Topic: Quick Communication - See Telephone Encounter >> Feb 25, 2019  8:23 AM Loma [place] wrote: CRM for notification. See Telephone encounter for: 02/25/19. Pt is inquiring of nurse as he has heart issues, take lots of meds, butwould Dr B possibly give him a sleep aid as in eszopiclone 3mg ? I told him he may should have at least a virtual to discuss but he wanted a nurse to check. [phone]

## 2019-02-25 NOTE — Telephone Encounter (Signed)
Attempted to reach pt, no answer. Pt identified himself on vm, vm left that referral was place and to call back if he had any additional questions.

## 2019-02-25 NOTE — Telephone Encounter (Signed)
Referral placed to physician of pt's choice.

## 2019-02-27 ENCOUNTER — Telehealth (INDEPENDENT_AMBULATORY_CARE_PROVIDER_SITE_OTHER): Payer: BC Managed Care – PPO | Admitting: Family Medicine

## 2019-02-27 ENCOUNTER — Other Ambulatory Visit: Payer: Self-pay

## 2019-02-27 DIAGNOSIS — F5102 Adjustment insomnia: Secondary | ICD-10-CM

## 2019-02-27 MED ORDER — TRAZODONE HCL 50 MG PO TABS: 25.0000 mg | ORAL_TABLET | Freq: Every evening | ORAL | 3 refills | Status: DC | PRN

## 2019-02-27 NOTE — Progress Notes (Signed)
This visit type was conducted due to national recommendations for restrictions regarding the COVID-19 pandemic in an effort to limit this patient's exposure and mitigate transmission in our community.   Virtual Visit via Telephone Note  I connected with Michael Livingston on 02/27/19 at  8:45 AM EDT by telephone and verified that I am speaking with the correct person using two identifiers.   I discussed the limitations, risks, security and privacy concerns of performing an evaluation and management service by telephone and the availability of in person appointments. I also discussed with the patient that there may be a patient responsible charge related to this service. The patient expressed understanding and agreed to proceed.  Location patient: home Location provider: work or home office Participants present for the call: patient, provider Patient did not have a visit in the prior 7 days to address this/these issue(s).   History of Present Illness: Patient called to discuss insomnia.  He has frequent early morning awakening usually around 1 or 2 AM.  This usually occurs when he gets up to go the bathroom.  Frequently cannot get back to sleep.  He states his mind gets turned on thinking about things he has to do the next day at work.  His wife had Lunesta on which apparently helped.  He has tried Benadryl but was concerned about side effects.  He tried melatonin without success.  No alcohol use.  No caffeine intake.  Denies depression symptoms   Observations/Objective: Patient sounds cheerful and well on the phone. I do not appreciate any SOB. Speech and thought processing are grossly intact. Patient reported vitals:  Assessment and Plan:  Transient insomnia  -We discussed nonpharmacologic factors and recommend avoidance of regular sedative medications -Avoidance of bright lights/ things at night that can contribute to insomnia -Consider trial of low-dose trazodone 50 mg nightly as  needed  Follow Up Instructions:  -As needed   99441 5-10 [id number] 11-20 [id number] 21-30 I did not refer this patient for an OV in the next 24 hours for this/these issue(s).  I discussed the assessment and treatment plan with the patient. The patient was provided an opportunity to ask questions and all were answered. The patient agreed with the plan and demonstrated an understanding of the instructions.   The patient was advised to call back or seek an in-person evaluation if the symptoms worsen or if the condition fails to improve as anticipated.  I provided 12 minutes of non-face-to-face time during this encounter.   Carolann Littler, MD

## 2019-03-07 ENCOUNTER — Other Ambulatory Visit: Payer: Self-pay

## 2019-03-07 ENCOUNTER — Encounter (HOSPITAL_BASED_OUTPATIENT_CLINIC_OR_DEPARTMENT_OTHER): Payer: Self-pay | Admitting: *Deleted

## 2019-03-07 ENCOUNTER — Emergency Department (HOSPITAL_BASED_OUTPATIENT_CLINIC_OR_DEPARTMENT_OTHER): Payer: BC Managed Care – PPO

## 2019-03-07 DIAGNOSIS — E119 Type 2 diabetes mellitus without complications: Secondary | ICD-10-CM | POA: Diagnosis not present

## 2019-03-07 DIAGNOSIS — Z79899 Other long term (current) drug therapy: Secondary | ICD-10-CM | POA: Diagnosis not present

## 2019-03-07 DIAGNOSIS — I2510 Atherosclerotic heart disease of native coronary artery without angina pectoris: Secondary | ICD-10-CM | POA: Insufficient documentation

## 2019-03-07 DIAGNOSIS — I10 Essential (primary) hypertension: Secondary | ICD-10-CM | POA: Diagnosis not present

## 2019-03-07 DIAGNOSIS — I252 Old myocardial infarction: Secondary | ICD-10-CM | POA: Insufficient documentation

## 2019-03-07 DIAGNOSIS — Z7984 Long term (current) use of oral hypoglycemic drugs: Secondary | ICD-10-CM | POA: Diagnosis not present

## 2019-03-07 DIAGNOSIS — I4891 Unspecified atrial fibrillation: Secondary | ICD-10-CM | POA: Diagnosis present

## 2019-03-07 DIAGNOSIS — Z7901 Long term (current) use of anticoagulants: Secondary | ICD-10-CM | POA: Diagnosis not present

## 2019-03-07 LAB — CBC
HCT: 43.2 % (ref 39.0–52.0)
Hemoglobin: 14.7 g/dL (ref 13.0–17.0)
MCH: 31.9 pg (ref 26.0–34.0)
MCHC: 34 g/dL (ref 30.0–36.0)
MCV: 93.7 fL (ref 80.0–100.0)
Platelets: 207 10*3/uL (ref 150–400)
RBC: 4.61 MIL/uL (ref 4.22–5.81)
RDW: 12.5 % (ref 11.5–15.5)
WBC: 6.5 10*3/uL (ref 4.0–10.5)
nRBC: 0 % (ref 0.0–0.2)

## 2019-03-07 MED ORDER — SODIUM CHLORIDE 0.9% FLUSH
3.0000 mL | Freq: Once | INTRAVENOUS | Status: DC
  Filled 2019-03-07: qty 3

## 2019-03-07 NOTE — ED Triage Notes (Signed)
Pt is on xarelto and has hx of afib is here because he can tell he has been in Afib since 10pm.  Pt took a diltiazem tablet at 10:20pm and a second one at 10:40pm without any relief.  Pt states that he went into afib last pm but it resolved when he took the cardizem.  HR in 140's in triage.  Pt is alert and oriented.

## 2019-03-08 ENCOUNTER — Telehealth: Payer: Self-pay | Admitting: Cardiovascular Disease

## 2019-03-08 ENCOUNTER — Emergency Department (HOSPITAL_BASED_OUTPATIENT_CLINIC_OR_DEPARTMENT_OTHER)
Admission: EM | Admit: 2019-03-08 | Discharge: 2019-03-08 | Disposition: A | Discharge status: Home / Self Care | Payer: BC Managed Care – PPO | Attending: Emergency Medicine | Admitting: Emergency Medicine

## 2019-03-08 ENCOUNTER — Other Ambulatory Visit: Payer: Self-pay

## 2019-03-08 DIAGNOSIS — I4891 Unspecified atrial fibrillation: Secondary | ICD-10-CM

## 2019-03-08 LAB — BASIC METABOLIC PANEL
Anion gap: 10 (ref 5–15)
BUN: 16 mg/dL (ref 6–20)
CO2: 22 mmol/L (ref 22–32)
Calcium: 9.5 mg/dL (ref 8.9–10.3)
Chloride: 104 mmol/L (ref 98–111)
Creatinine, Ser: 0.82 mg/dL (ref 0.61–1.24)
GFR calc Af Amer: >60 mL/min (ref >60)
GFR calc non Af Amer: >60 mL/min (ref >60)
Glucose, Bld: 177 mg/dL — ABNORMAL HIGH (ref 70–99)
Potassium: 3.6 mmol/L (ref 3.5–5.1)
Sodium: 136 mmol/L (ref 135–145)

## 2019-03-08 LAB — MAGNESIUM: Magnesium: 2.2 mg/dL (ref 1.7–2.4)

## 2019-03-08 LAB — TROPONIN I (HIGH SENSITIVITY): Troponin I (High Sensitivity): 4 ng/L (ref <18)

## 2019-03-08 MED ORDER — SODIUM CHLORIDE 0.9 % IV BOLUS (SEPSIS)
1000.0000 mL | Freq: Once | INTRAVENOUS | Status: AC
  Administered 2019-03-08: 1000 mL via INTRAVENOUS

## 2019-03-08 MED ORDER — PROPOFOL 10 MG/ML IV BOLUS
1.0000 mg/kg | Freq: Once | INTRAVENOUS | Status: DC
  Filled 2019-03-08: qty 20

## 2019-03-08 MED ORDER — PROPOFOL 1000 MG/100ML IV EMUL
INTRAVENOUS | Status: AC | PRN
  Administered 2019-03-08: 90 mg via INTRAVENOUS

## 2019-03-08 NOTE — Telephone Encounter (Signed)
° ° °  Patient calling to report he wen to ED on 10/29 due to afib. Patient wants to know what the next step in care will be.   Please call

## 2019-03-08 NOTE — Telephone Encounter (Signed)
Dr. Claiborne Billings DOD today. Contacted Veronda Prude, LPN working with Dr. Claiborne Billings today. Informed her that pt seen in ED today 10/30 for a fib symptoms and was advised to f/u with card to discuss cardiac ablation. She consulted Dr. Claiborne Billings who is okay with pt taking 48 hr slot for 03/21/2019.  Contacted pt and informed of the above. Pt agreeable with appt with Dr. Claiborne Billings on 11/12 at 4:00 pm.

## 2019-03-08 NOTE — Sedation Documentation (Signed)
PT is alert and oriented. Tolerating ginger ale. Requesting to go home.

## 2019-03-08 NOTE — Discharge Instructions (Addendum)
Please continue your metoprolol and Xarelto as prescribed.  I recommend close follow-up with your cardiologist as they may discuss possible cardiac ablation.

## 2019-03-08 NOTE — Sedation Documentation (Signed)
Family updated as to patient's status.

## 2019-03-08 NOTE — ED Provider Notes (Signed)
TIME SEEN: 12:24 AM  CHIEF COMPLAINT: Atrial fibrillation  HPI: Patient is a 58 year old male with history of atrial fibrillation on metoprolol and Xarelto, CAD status post stent, hypertension, hyperlipidemia, diabetes who presents to the emergency department with palpitations that started at 10 PM last night.  States that he takes diltiazem as needed whenever he develops palpitations.  Yesterday he felt like he went into A. fib and took a diltiazem and symptoms resolved.  States that that did not work Midwife.  He has had multiple previous cardioversions and states that his cardiologist told him that if this occurred 1 more time he would likely need a cardiac ablation.  States he had very mild central chest pressure with this tonight that has resolved.  No shortness of breath, nausea, vomiting.  No recent fevers, cough, diarrhea.  Has been taking his Xarelto without any missed doses.  ROS: See HPI Constitutional: no fever  Eyes: no drainage  ENT: no runny nose   Cardiovascular:   chest pain  Resp: no SOB  GI: no vomiting GU: no dysuria Integumentary: no rash  Allergy: no hives  Musculoskeletal: no leg swelling  Neurological: no slurred speech ROS otherwise negative  PAST MEDICAL HISTORY/PAST SURGICAL HISTORY:  Past Medical History:  Diagnosis Date  . Allergy   . Atrial fibrillation ([place])    a. diagnosed in 01/2017 --> s/p DCCV. Started on Xarelto for anticoagulation.   Marland Kitchen CAD (coronary artery disease)    a. s/p DES to LAD in 2005 b. patent by cath in 2007 with 30% LCx stenosis noted at that time c. normal NST in 05/2011  . Chronic kidney disease    1 kidney stone  . Diabetes mellitus without complication (HCC)    type 2, no meds needed  . Hyperlipidemia   . Hypertension   . Myocardial infarction [facility])     MEDICATIONS:  Prior to Admission medications   Medication Sig Start Date End Date Taking? Authorizing Provider  diltiazem (CARDIZEM) 30 MG tablet Take 1 tablet (30 mg total)  by mouth daily as needed. 08/13/18 08/13/19  Burchette, Alinda Sierras, MD  ezetimibe-simvastatin (VYTORIN) 10-20 MG tablet Take 1 tablet by mouth daily. 11/22/18   Troy Sine, MD  famotidine (PEPCID) 20 MG tablet Take 1 tablet (20 mg total) by mouth 2 (two) times daily. 10/12/18   Burchette, Alinda Sierras, MD  lisinopril (ZESTRIL) 2.5 MG tablet Take 1 tablet (2.5 mg total) by mouth daily. 10/12/18   Burchette, Alinda Sierras, MD  metFORMIN (GLUCOPHAGE) 500 MG tablet Take 1 tablet (500 mg total) by mouth 2 (two) times daily with a meal. 01/17/19   Burchette, Alinda Sierras, MD  metoprolol succinate (TOPROL XL) 25 MG 24 hr tablet Take 1.5 tablets (37.5 mg total) by mouth daily. 01/23/18   Troy Sine, MD  montelukast (SINGULAIR) 10 MG tablet Take 1 tablet (10 mg total) by mouth at bedtime. 10/12/18   Burchette, Alinda Sierras, MD  Multiple Vitamins-Minerals (MULTIVITAMIN WITH MINERALS) tablet Take 1 tablet by mouth at bedtime.     [provider]  nitroGLYCERIN (NITROSTAT) 0.4 MG SL tablet Place 1 tablet (0.4 mg total) under the tongue every 5 (five) minutes as needed for chest pain. 01/02/18   Duke, Tami Lin, PA  pantoprazole (PROTONIX) 40 MG tablet Take 1 tablet (40 mg total) by mouth daily. Take 30-60 min before first meal of the day 08/13/18   Burchette, Alinda Sierras, MD  PAZEO 0.7 % SOLN INSTILL 1 DROP INTO INTO BOTH  EYES EVERY MORNING AS NEEDED FOR ITCHING 10/03/15   [provider]  rivaroxaban (XARELTO) 20 MG TABS tablet Take 1 tablet (20 mg total) by mouth daily with supper. 08/13/18   Burchette, Alinda Sierras, MD  sildenafil (VIAGRA) 100 MG tablet Take 0.5-1 tablets (50-100 mg total) by mouth daily as needed for erectile dysfunction. 11/07/18   Burchette, Alinda Sierras, MD  traZODone (DESYREL) 50 MG tablet Take 0.5-1 tablets (25-50 mg total) by mouth at bedtime as needed for sleep. 02/27/19   Burchette, Alinda Sierras, MD    ALLERGIES:  Allergies  Allergen Reactions  . Aleve [Naproxen Sodium] Swelling and Other (See Comments)     Mouth swells  . Oxycodone Other (See Comments)    Rendered the patient unable to fall asleep AND he "almost passed out"  . Sulfa Antibiotics Hives  . Vicodin [Hydrocodone-Acetaminophen] Other (See Comments)    Rendered the patient unable to fall asleep AND he "almost passed out"    SOCIAL HISTORY:  Social History   Tobacco Use  . Smoking status: Former Smoker    Packs/day: 1.00    Years: 8.00    Pack years: 8.00    Quit date: 07/17/1987    Years since quitting: 31.6  . Smokeless tobacco: Never Used  Substance Use Topics  . Alcohol use: No    Alcohol/week: 0.0 standard drinks    FAMILY HISTORY: Family History  Problem Relation Age of Onset  . Diabetes Mother   . Heart disease Father   . Diabetes Father   . Heart disease Paternal Grandfather   . Colon cancer Neg Hx   . Esophageal cancer Neg Hx   . Stomach cancer Neg Hx   . Rectal cancer Neg Hx     EXAM: BP (!) 152/96   Pulse (S) (!) 142 Comment: Afib  Temp 97.6 F (36.4 C) (Oral)   Resp 18   Wt 89.8 kg   SpO2 100%   BMI 26.12 kg/m  CONSTITUTIONAL: Alert and oriented and responds appropriately to questions. Well-appearing; well-nourished, extremely pleasant, in no distress HEAD: Normocephalic EYES: Conjunctivae clear, pupils appear equal, EOMI ENT: normal nose; moist mucous membranes NECK: Supple, no meningismus, no nuchal rigidity, no LAD  CARD: Irregularly irregular and tachycardic; S1 and S2 appreciated; no murmurs, no clicks, no rubs, no gallops RESP: Normal chest excursion without splinting or tachypnea; breath sounds clear and equal bilaterally; no wheezes, no rhonchi, no rales, no hypoxia or respiratory distress, speaking full sentences ABD/GI: Normal bowel sounds; non-distended; soft, non-tender, no rebound, no guarding, no peritoneal signs, no hepatosplenomegaly BACK:  The back appears normal and is non-tender to palpation, there is no CVA tenderness EXT: Normal ROM in all joints; non-tender to palpation;  no edema; normal capillary refill; no cyanosis, no calf tenderness or swelling    SKIN: Normal color for age and race; warm; no rash NEURO: Moves all extremities equally PSYCH: The patient's mood and manner are appropriate. Grooming and personal hygiene are appropriate.  MEDICAL DECISION MAKING: Patient here with A. fib with RVR.  Became symptomatic around 10:00 tonight.  Reports compliance with his Xarelto without any missed doses.  He has been cardioverted successfully in the emergency department in the past and request the same today which I feel is very reasonable.  His chads vasc 2 score is 2.  I recommend close follow-up with his cardiologist as he will likely need evaluation for possible cardiac ablation given multiple cardioversions.  He is comfortable with this plan.  Labs,  x-ray pending.   ED PROGRESS: Labs unremarkable.  Electrolytes within normal limits.  Troponin negative.  Chest x-ray clear.  Patient successfully cardioverted after being given 1 mg/kg of IV propofol and synchronized cardioversion with 120 J.  Currently in a sinus rhythm and asymptomatic.  His wife is here to drive him home.  Discussed return precautions.  Patient will follow up with his cardiologist as an outpatient.   At this time, I do not feel there is any life-threatening condition present. I have reviewed, interpreted and discussed all results (EKG, imaging, lab, urine as appropriate) and exam findings with patient/family. I have reviewed nursing notes and appropriate previous records.  I feel the patient is safe to be discharged home without further emergent workup and can continue workup as an outpatient as needed. Discussed usual and customary return precautions. Patient/family verbalize understanding and are comfortable with this plan.  Outpatient follow-up has been provided as needed. All questions have been answered.   Michael Livingston was evaluated in Emergency Department on 03/08/2019 for the symptoms  described in the history of present illness. He was evaluated in the context of the global COVID-19 pandemic, which necessitated consideration that the patient might be at risk for infection with the SARS-CoV-2 virus that causes COVID-19. Institutional protocols and algorithms that pertain to the evaluation of patients at risk for COVID-19 are in a state of rapid change based on information released by regulatory bodies including the CDC and federal and state organizations. These policies and algorithms were followed during the patient's care in the ED.     EKG Interpretation  Date/Time:  Thursday March 07 2019 23:44:47 EDT Ventricular Rate:  141 PR Interval:  140 QRS Duration: 94 QT Interval:  284 QTC Calculation: 434 R Axis:   3 Text Interpretation: A fib with RVR No longer in NSR as with previous EKG Confirmed by Pryor Curia [phone]) on 03/08/2019 12:00:04 AM         EKG Interpretation  Date/Time:  Friday March 08 2019 01:15:50 EDT Ventricular Rate:  68 PR Interval:  140 QRS Duration: 111 QT Interval:  369 QTC Calculation: 393 R Axis:   25 Text Interpretation: Sinus rhythm RSR' in V1 or V2, right VCD or RVH Minimal ST elevation, inferior leads Baseline wander in lead(s) V4 A fib has resolved Confirmed by Pryor Curia [phone]) on 03/08/2019 1:23:27 AM         CRITICAL CARE Performed by: Cyril Mourning Keshana Klemz   Total critical care time: 44 minutes  Critical care time was exclusive of separately billable procedures and treating other patients.  Critical care was necessary to treat or prevent imminent or life-threatening deterioration.  Critical care was time spent personally by me on the following activities: development of treatment plan with patient and/or surrogate as well as nursing, discussions with consultants, evaluation of patient's response to treatment, examination of patient, obtaining history from patient or surrogate, ordering and performing treatments and  interventions, ordering and review of laboratory studies, ordering and review of radiographic studies, pulse oximetry and re-evaluation of patient's condition.   .Sedation  Date/Time: 03/08/2019 1:30 AM Performed by: Alfredo Spong, Delice Bison, DO Authorized by: Eris Hannan, Delice Bison, DO   Consent:    Consent obtained:  Written   Consent given by:  Patient   Risks discussed:  Allergic reaction, dysrhythmia, inadequate sedation, nausea, prolonged hypoxia resulting in organ damage, prolonged sedation necessitating reversal, respiratory compromise necessitating ventilatory assistance and intubation and vomiting   Alternatives discussed:  Analgesia without sedation,  anxiolysis and regional anesthesia Universal protocol:    Procedure explained and questions answered to patient or proxy's satisfaction: yes     Relevant documents present and verified: yes     Test results available and properly labeled: yes     Imaging studies available: yes     Required blood products, implants, devices, and special equipment available: yes     Site/side marked: yes     Immediately prior to procedure a time out was called: yes     Patient identity confirmation method:  Verbally with patient Indications:    Procedure performed:  Cardioversion   Procedure necessitating sedation performed by:  Physician performing sedation Pre-sedation assessment:    Time since last food or drink:  1030pm   ASA classification: class 2 - patient with mild systemic disease     Neck mobility: normal     Mouth opening:  3 or more finger widths   Thyromental distance:  4 finger widths   Mallampati score:  I - soft palate, uvula, fauces, pillars visible   Pre-sedation assessments completed and reviewed: airway patency, cardiovascular function, hydration status, mental status, nausea/vomiting, pain level, respiratory function and temperature     Pre-sedation assessment completed:  03/08/2019 1:00 AM Immediate pre-procedure details:     Reassessment: Patient reassessed immediately prior to procedure     Reviewed: vital signs, relevant labs/tests and NPO status     Verified: bag valve mask available, emergency equipment available, intubation equipment available, IV patency confirmed, oxygen available and suction available   Procedure details (see MAR for exact dosages):    Preoxygenation:  Nasal cannula   Sedation:  Propofol   Intended level of sedation: deep   Analgesia:  None   Intra-procedure monitoring:  Blood pressure monitoring, cardiac monitor, continuous pulse oximetry, frequent LOC assessments, frequent vital sign checks and continuous capnometry   Intra-procedure events: none     Total Provider sedation time (minutes):  10 Post-procedure details:    Post-sedation assessment completed:  03/08/2019 1:54 AM   Attendance: Constant attendance by certified staff until patient recovered     Recovery: Patient returned to pre-procedure baseline     Post-sedation assessments completed and reviewed: airway patency, cardiovascular function, hydration status, mental status, nausea/vomiting, pain level, respiratory function and temperature     Patient is stable for discharge or admission: yes     Patient tolerance:  Tolerated well, no immediate complications .Cardioversion  Date/Time: 03/08/2019 1:54 AM Performed by: Brendi Mccarroll, Delice Bison, DO Authorized by: Amore Ackman, Delice Bison, DO   Consent:    Consent obtained:  Written   Consent given by:  Patient   Risks discussed:  Cutaneous burn, death, induced arrhythmia and pain   Alternatives discussed:  Rate-control medication Pre-procedure details:    Cardioversion basis:  Elective   Rhythm:  Atrial fibrillation   Electrode placement:  Anterior-posterior Patient sedated: Yes. Refer to sedation procedure documentation for details of sedation.  Attempt one:    Cardioversion mode:  Synchronous   Waveform:  Biphasic   Shock (Joules):  120   Shock outcome:  Conversion to normal sinus  rhythm        Miriah Maruyama, Delice Bison, DO 03/08/19 [phone]

## 2019-03-15 ENCOUNTER — Other Ambulatory Visit: Payer: Self-pay

## 2019-03-15 ENCOUNTER — Encounter: Payer: Self-pay | Admitting: Internal Medicine

## 2019-03-15 ENCOUNTER — Ambulatory Visit: Payer: Self-pay

## 2019-03-15 ENCOUNTER — Ambulatory Visit (INDEPENDENT_AMBULATORY_CARE_PROVIDER_SITE_OTHER): Payer: BC Managed Care – PPO | Admitting: Internal Medicine

## 2019-03-15 ENCOUNTER — Telehealth: Payer: BC Managed Care – PPO | Admitting: Family Medicine

## 2019-03-15 VITALS — BP 140/88 | HR 62 | Temp 97.8°F | Ht 73.0 in | Wt 199.0 lb

## 2019-03-15 DIAGNOSIS — Z87820 Personal history of traumatic brain injury: Secondary | ICD-10-CM | POA: Diagnosis not present

## 2019-03-15 DIAGNOSIS — G44309 Post-traumatic headache, unspecified, not intractable: Secondary | ICD-10-CM | POA: Diagnosis not present

## 2019-03-15 DIAGNOSIS — E119 Type 2 diabetes mellitus without complications: Secondary | ICD-10-CM | POA: Diagnosis not present

## 2019-03-15 DIAGNOSIS — L03211 Cellulitis of face: Secondary | ICD-10-CM | POA: Diagnosis not present

## 2019-03-15 MED ORDER — DOXYCYCLINE HYCLATE 100 MG PO TABS: 100.0000 mg | ORAL_TABLET | Freq: Two times a day (BID) | ORAL | 0 refills | Status: DC

## 2019-03-15 NOTE — Patient Instructions (Addendum)
.  Please take all new medication as prescribed - the antibiotic  You will be contacted regarding the referral for: CT head - see [facility] now  Please continue all other medications as before, and refills have been done if requested.  Please have the pharmacy call with any other refills you may need.  Please continue your efforts at being more active, low cholesterol diet, and weight control.  Please keep your appointments with your specialists as you may have planned

## 2019-03-15 NOTE — Telephone Encounter (Signed)
Pt has an appt on 03/15/2019 with Dr. Volanda Napoleon
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
## 2019-03-15 NOTE — Assessment & Plan Note (Signed)
[place] for Head CT r/o cns bleed with marked trauma

## 2019-03-15 NOTE — Telephone Encounter (Signed)
Noted  

## 2019-03-15 NOTE — Assessment & Plan Note (Signed)
stable overall by history and exam, recent data reviewed with pt, and pt to continue medical treatment as before,  to f/u any worsening symptoms or concerns le  
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
## 2019-03-18 ENCOUNTER — Other Ambulatory Visit: Payer: Self-pay

## 2019-03-18 ENCOUNTER — Telehealth: Payer: Self-pay | Admitting: Family Medicine

## 2019-03-18 ENCOUNTER — Ambulatory Visit
Admission: RE | Admit: 2019-03-18 | Discharge: 2019-03-18 | Disposition: A | Discharge status: Home / Self Care | Payer: BC Managed Care – PPO | Source: Ambulatory Visit | Attending: Internal Medicine | Admitting: Internal Medicine

## 2019-03-18 DIAGNOSIS — G44309 Post-traumatic headache, unspecified, not intractable: Secondary | ICD-10-CM

## 2019-03-18 NOTE — Telephone Encounter (Signed)
Please advise 
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
## 2019-03-18 NOTE — Telephone Encounter (Signed)
Dr. Jenny Reichmann has already taken care of this pt, and he is getting his CT scan done. Authorization for CT has been taken care of by the [facility] office

## 2019-03-19 ENCOUNTER — Telehealth: Payer: Self-pay | Admitting: Internal Medicine

## 2019-03-19 NOTE — Telephone Encounter (Signed)
Sounds like scan has already been ordered.

## 2019-03-19 NOTE — Telephone Encounter (Signed)
Pt returned call and asked if he could get the report from Dr. Jenny Reichmann. Informed pt that this would need to be address by [facility] as they were the ones who ordered it. Pt states he is concerned about the report because he has yellow drainage from his nose. He asked if I could send a message to their office because he states he has already called and tried to obtain report but no response.

## 2019-03-19 NOTE — Telephone Encounter (Signed)
Patient requesting call back from Dr. Gwynn Burly CMA to discuss CT scan results. Patient has contacted PCP and they advised him to contact ordering physician.

## 2019-03-19 NOTE — Telephone Encounter (Signed)
Fortunately no problems found on the CT head;  Ok to f/u with PCP

## 2019-03-20 NOTE — Telephone Encounter (Signed)
Pt has called back and has been informed of results. Will follow up with PCP.

## 2019-03-20 NOTE — Telephone Encounter (Signed)
Called pt, LVM.   

## 2019-03-21 ENCOUNTER — Other Ambulatory Visit: Payer: Self-pay

## 2019-03-21 ENCOUNTER — Ambulatory Visit: Payer: BC Managed Care – PPO | Admitting: Cardiovascular Disease

## 2019-03-21 ENCOUNTER — Encounter: Payer: Self-pay | Admitting: Cardiovascular Disease

## 2019-03-21 VITALS — BP 116/62 | HR 59 | Temp 97.3°F | Ht 74.0 in | Wt 201.0 lb

## 2019-03-21 DIAGNOSIS — E785 Hyperlipidemia, unspecified: Secondary | ICD-10-CM

## 2019-03-21 DIAGNOSIS — I25119 Atherosclerotic heart disease of native coronary artery with unspecified angina pectoris: Secondary | ICD-10-CM | POA: Diagnosis not present

## 2019-03-21 DIAGNOSIS — I480 Paroxysmal atrial fibrillation: Secondary | ICD-10-CM | POA: Diagnosis not present

## 2019-03-21 DIAGNOSIS — I10 Essential (primary) hypertension: Secondary | ICD-10-CM | POA: Diagnosis not present

## 2019-03-21 DIAGNOSIS — Z7901 Long term (current) use of anticoagulants: Secondary | ICD-10-CM

## 2019-03-21 NOTE — Patient Instructions (Addendum)
Medication Instructions:  Your physician recommends that you continue on your current medications as directed. Please refer to the Current Medication list given to you today.  *If you need a refill on your cardiac medications before your next appointment, please call your pharmacy*  Lab Work: NONE If you have labs (blood work) drawn today and your tests are completely normal, you will receive your results only by: Marland Kitchen MyChart Message (if you have MyChart) OR . A paper copy in the mail If you have any lab test that is abnormal or we need to change your treatment, we will call you to review the results.  Testing/Procedures: Your physician has requested that you have an echocardiogram. Echocardiography is a painless test that uses sound waves to create images of your heart. It provides your doctor with information about the size and shape of your heart and how well your heart's chambers and valves are working. This procedure takes approximately one hour. There are no restrictions for this procedure. LOCATION: [facility] [employer]: [place], [place] [address]  TO BE SCHEDULED WITHIN THE NEXT 2 WEEKS IF POSSIBLE. YOU WILL BE CONTACTED BY A SCHEDULER TO SET UP YOUR APPOINTMENT    Follow-Up: At [facility], you and your health needs are our priority.  As part of our continuing mission to provide you with exceptional heart care, we have created designated Provider Care Teams.  These Care Teams include your primary Cardiologist (physician) and Advanced Practice Providers (APPs -  Physician Assistants and Nurse Practitioners) who all work together to provide you with the care you need, when you need it.  Your next appointment:   6 MONTHS  The format for your next appointment:   In Person  Provider:   You may see Shelva Majestic, MD or one of the following Advanced Practice Providers on your designated Care Team:    Almyra Deforest, PA-C  Fabian Sharp,  PA-C or   Roby Lofts, PA-C   Other Instructions REFERRAL TO DR. ALLRED IN CARDIAC ELECTROPHYSIOLOGY. YOU WILL BE CONTACTED BY A SCHEDULER TO SET UP YOUR APPOINTMENT WITH DR. ALLRED. MAKE SURE TO HAVE YOUR ECHOCARDIOGRAM COMPLETED BEFORE THIS APPOINTMENT.

## 2019-03-21 NOTE — Progress Notes (Signed)
Patient ID: Michael Livingston, male   DOB: 10/07/1960, 58 y.o.   MRN: [id number]     HPI: Michael Livingston is a 58 y.o. male presents to the office for an 43-monthfollow-up cardiology evaluation.  Michael Livingston CAD and in October 2005 underwent stenting to his proximal to mid left anterior descending artery. His last catheterization was in October 2007 and his LAD was  was widely patent. He did have mild 30% circumflex stenosis. A nuclear perfusion study in January 2013 continued to show normal perfusion.  Michael Livingston a history of palpitations which have been controlled with beta blocker therapy. He has a history of hyperlipidemia with an atherogenic lipid panel with low HDL levels. An NMR lipoprofile in 2014 demonstrated increased insulin resistance and an increased wall LDL particles.  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 Since that time, he has lost over 20 pounds. He denies recent chest pain. He denies significant increase in palpitations except a rare episode.   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  He denies palpitations.  He denies PND, orthopnea.  Last year he underwent orthopedic surgery to his shoulder and tolerated this well.    He was started on metformin for diabetes mellitus.  In June 2017 hemoglobin A1c was 6.4, and most recently in January 2018  improved to 5.7.  I last saw him, he did note mild shortness of breath with arm raising and denied exertional chest pain or palpitations.  He was on aspirin and Plavix for dual antiplatelet therapy.  He was on ramipril 5 mg, metoprolol 25 mg twice a day for hypertension and Vytorin 1044.  Hyperlipidemia.  Heme notes mild shortness of breath when he raises his arms.  He denies exertional chest pain symptoms or exertional dyspnea.  He continues to be on  aspirin and Plavix for dual antiplatelet therapy.  He continues to be on ram a pro-5 mg, metoprolol 25 mg twice a day for hypertension.  He is on Vytorin 10/40 for hyperlipidemia, as well as lovaza.  Since I last saw him, he developed new onset atrial fibrillation on 01/19/2017.  [facility]  CBC be met were normal as was his TSH.  He underwent successful DC cardioversion at 150 J with restoration of sinus rhythm.  His Lopressor dose was increased to 30 mg twice a day, and he was started on Xarelto 20 mg for a cha2ds2score of 3 (CAD, hypertension, diabetes mellitus.  His Plavix was discontinued.  He saw Michael Livingston office follow-up.  He was doing well without recurrence.  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  The month previously, he had significantly banged his head on a piling intrahepatic and as result was very concerned about work-related trauma, particularly while being on Xarelto. He was unaware of any recurrent episodes of arrhythmia.  We had a lengthy discussion about risk-benefit ratio of anticoagulation, particularly with his job.  He preferred not continue Xarelto due to his job risk.  As result, Xarelto was discontinued and he was resumed back on aspirin 81 mg and Plavix 75 mg. I increased metoprolol recommended he continue to take lisinopril 20 mg daily.  If he  were to develop any recurrent episodes of AFeinitiation of anticoagulation would be necessary.  When I saw him in the office in March 2019 he apparently was only taking metoprolol tartrate once a day.  As result, I recommended switching him to metoprolol succinate to allow for improved sustained action.  He had been doing well was unaware of any arrhythmias until October 10, 2017.  It was extremely hot, he was working on a roof, he was dehydrated, and he noticed his heart rate speeding up.  He quickly got off the roof.  He was seen by  Dr. Laurance Flatten and presented to [facility] ER where he was found to be in AF with ventricular rate in the 150s to 160s.  I saw him in the emergency room and in the ER he underwent successful cardioversion with restoration of sinus rhythm.  At that time he was advised to increase metoprolol succinate to 37.5 mg and instead of taking the medication at bedtime to take it in the morning.  He was also given a prescription for short acting diltiazem 30 mg to take on a as needed basis.  I saw him in the office 1 week later on October 18, 2017 at which time he was doing well and was not experiencing any recurrent arrhythmia.    He was seen as an add-on in the office on December 11, 2017 by Michael Livingston, [place].  At that time he had complaints of shortness of breath with activity as well as vague chest fullness and tightness with minimal work.  He was set up for repeat cardiac catheterization by me on December 15, 2017.  This did not reveal any significant residual CAD and he had overlapping stents in the proximal to mid LAD with smooth intimal hyperplasia of 20%.  He had a normal ramus intermediate, dominant left circumflex nondominant RCA.  Medical therapy was recommended and if he had recurrent PAF consider potential candidacy for ablation.  He was restarted back on Xarelto.  He was seen for hospital follow-up with Michael Livingston, Michael Livingston on January 02, 2018 and he was doing well.  He has occasional chest pain twinges had largely resolved with Protonix.  I last saw him on July 26, 2018 at which time he was feeling well and denied any recurrent episodes of arrhythmia.  He had previously been evaluated on  on June 18, 2018 by Michael Livingston, nurse practitioner because of concerns for possible blood pressure elevation with monitoring of his home blood pressure unit.  Lisinopril was initiated.  During my evaluation he was on lisinopril 5 mg, metoprolol succinate 37.5 mg daily and has a prescription for diltiazem 30 mg to take as needed if recurrent  palpitations.  He  not required any additional diltiazem supplementation.  He continued to take Protonix for GERD.  He is on Xarelto for anticoagulation.  He continued to be on Vytorin 10/40 for hyperlipidemia.    Michael Livingston states that he had done fairly well without recurrent arrhythmia until March 07, 2019.  On that day, he had taken his father to our office and he had a syncopal spell outside while waiting for the elevator.  As result, only had to take his father to the emergency room and ultimately he required a permanent pacemaker.  However later that night upon coming home from the hospital he was eating dinner late and developed recurrent tachycardia.  He had taken diltiazem 30 mg x 2 without benefit and ultimately presented to the emergency room where again  he was found to be in atrial fibrillation.  Since he was anticoagulated on Xarelto he underwent another successful cardioversion in the emergency room with restoration of sinus rhythm.  He denied any associated anginal symptomatology.  He has continued to take metoprolol succinate 37.5 mg daily, lisinopril 2.5 mg, and is on ezetimibe simvastatin 10/20.  He continues to be on Xarelto 20 mg without bleeding.  He presents for evaluation.  Past Medical History:  Diagnosis Date  . Allergy   . Atrial fibrillation ([place])    a. diagnosed in 01/2017 --> s/p DCCV. Started on Xarelto for anticoagulation.   Marland Kitchen CAD (coronary artery disease)    a. s/p DES to LAD in 2005 b. patent by cath in 2007 with 30% LCx stenosis noted at that time c. normal NST in 05/2011  . Chronic kidney disease    1 kidney stone  . Diabetes mellitus without complication (HCC)    type 2, no meds needed  . Hyperlipidemia   . Hypertension   . Myocardial infarction [facility])     Past Surgical History:  Procedure Laterality Date  . CARDIAC SURGERY  2005   stent  . CORONARY STENT PLACEMENT    . KNEE ARTHROSCOPY     left 2003, right 2005  . LEFT HEART CATH AND CORONARY  ANGIOGRAPHY N/A 12/15/2017   Procedure: LEFT HEART CATH AND CORONARY ANGIOGRAPHY;  Surgeon: Troy Sine, MD;  Location: [facility];  Service: Cardiovascular;  Laterality: N/A;  . SHOULDER SURGERY      Allergies  Allergen Reactions  . Aleve [Naproxen Sodium] Swelling and Other (See Comments)    Mouth swells  . Oxycodone Other (See Comments)    Rendered the patient unable to fall asleep AND he "almost passed out"  . Sulfa Antibiotics Hives  . Vicodin [Hydrocodone-Acetaminophen] Other (See Comments)    Rendered the patient unable to fall asleep AND he "almost passed out"    Current Outpatient Medications  Medication Sig Dispense Refill  . diltiazem (CARDIZEM) 30 MG tablet Take 1 tablet (30 mg total) by mouth daily as needed. 90 tablet 3  . doxycycline (VIBRA-TABS) 100 MG tablet Take 1 tablet (100 mg total) by mouth 2 (two) times daily. 20 tablet 0  . ezetimibe-simvastatin (VYTORIN) 10-20 MG tablet Take 1 tablet by mouth daily. 90 tablet 1  . famotidine (PEPCID) 20 MG tablet Take 1 tablet (20 mg total) by mouth 2 (two) times daily. 90 tablet 3  . lisinopril (ZESTRIL) 2.5 MG tablet Take 1 tablet (2.5 mg total) by mouth daily. 90 tablet 3  . metFORMIN (GLUCOPHAGE) 500 MG tablet Take 1 tablet (500 mg total) by mouth 2 (two) times daily with a meal. 180 tablet 0  . metoprolol succinate (TOPROL XL) 25 MG 24 hr tablet Take 1.5 tablets (37.5 mg total) by mouth daily. 270 tablet 3  . montelukast (SINGULAIR) 10 MG tablet Take 1 tablet (10 mg total) by mouth at bedtime. 90 tablet 3  . Multiple Vitamins-Minerals (MULTIVITAMIN WITH MINERALS) tablet Take 1 tablet by mouth at bedtime.     . nitroGLYCERIN (NITROSTAT) 0.4 MG SL tablet Place 1 tablet (0.4 mg total) under the tongue every 5 (five) minutes as needed for chest pain. 25 tablet 2  . pantoprazole (PROTONIX) 40 MG tablet Take 1 tablet (40 mg total) by mouth daily. Take 30-60 min before first meal of the day 90 tablet 3  . PAZEO 0.7 % SOLN  INSTILL 1 DROP INTO INTO BOTH EYES EVERY  MORNING AS NEEDED FOR ITCHING  4  . rivaroxaban (XARELTO) 20 MG TABS tablet Take 1 tablet (20 mg total) by mouth daily with supper. 90 tablet 1  . sildenafil (VIAGRA) 100 MG tablet Take 0.5-1 tablets (50-100 mg total) by mouth daily as needed for erectile dysfunction. 15 tablet 3  . traZODone (DESYREL) 50 MG tablet Take 0.5-1 tablets (25-50 mg total) by mouth at bedtime as needed for sleep. 30 tablet 3   No current facility-administered medications for this visit.     Social History   Socioeconomic History  . Marital status: Married    Spouse name: Not on file  . Number of children: Not on file  . Years of education: Not on file  . Highest education level: Not on file  Occupational History  . Not on file  Social Needs  . Financial resource strain: Not on file  . Food insecurity    Worry: Not on file    Inability: Not on file  . Transportation needs    Medical: Not on file    Non-medical: Not on file  Tobacco Use  . Smoking status: Former Smoker    Packs/day: 1.00    Years: 8.00    Pack years: 8.00    Quit date: 07/17/1987    Years since quitting: 31.6  . Smokeless tobacco: Never Used  Substance and Sexual Activity  . Alcohol use: No    Alcohol/week: 0.0 standard drinks  . Drug use: No  . Sexual activity: Not on file  Lifestyle  . Physical activity    Days per week: Not on file    Minutes per session: Not on file  . Stress: Not on file  Relationships  . Social Herbalist on phone: Not on file    Gets together: Not on file    Attends religious service: Not on file    Active member of club or organization: Not on file    Attends meetings of clubs or organizations: Not on file    Relationship status: Not on file  . Intimate partner violence    Fear of current or ex partner: Not on file    Emotionally abused: Not on file    Physically abused: Not on file    Forced sexual activity: Not on file  Other Topics Concern   . Not on file  Social History Narrative  . Not on file    Family History  Problem Relation Age of Onset  . Diabetes Mother   . Heart disease Father   . Diabetes Father   . Heart disease Paternal Grandfather   . Colon cancer Neg Hx   . Esophageal cancer Neg Hx   . Stomach cancer Neg Hx   . Rectal cancer Neg Hx    Social history is notable that he is married and has 2 children. There is no tobacco or alcohol use. He has an autistic son.   ROS General: Negative; No fevers, chills, or night sweats; there is purposeful weight loss HEENT: Negative; No changes in vision or hearing, sinus congestion, difficulty swallowing Pulmonary: Negative; No cough, wheezing, shortness of breath, hemoptysis Cardiovascular: See history of present illness:  GI: GERD GU: Negative; No dysuria, hematuria, or difficulty voiding Musculoskeletal: Left shoulder labrum tear Hematologic/Oncology: Negative; no easy bruising, bleeding Endocrine: Negative; no heat/cold intolerance; no diabetes Neuro: Negative; no changes in balance, headaches Skin: Negative; No rashes or skin lesions Psychiatric: Negative; No behavioral problems, depression Sleep: Negative; No snoring,  daytime sleepiness, hypersomnolence, bruxism, restless legs, hypnogognic hallucinations, no cataplexy Other comprehensive 14 point system review is negative.   PE BP 116/62 (BP Location: Left Arm, Patient Position: Sitting, Cuff Size: Normal)   Pulse (!) 59   Temp (!) 97.3 F (36.3 C)   Ht '6\' 2"'$  (1.88 m)   Wt 201 lb (91.2 kg)   BMI 25.81 kg/m    Repeat blood pressure by me was 122/70  Wt Readings from Last 3 Encounters:  03/21/19 201 lb (91.2 kg)  03/15/19 199 lb (90.3 kg)  03/07/19 198 lb (89.8 kg)   General: Alert, oriented, no distress.  Skin: normal turgor, no rashes, warm and dry HEENT: Normocephalic, atraumatic. Pupils equal round and reactive to light; sclera anicteric; extraocular muscles intact;  Nose without nasal septal  hypertrophy Mouth/Parynx benign; Mallinpatti scale 3 Neck: No JVD, no carotid bruits; normal carotid upstroke Lungs: clear to ausculatation and percussion; no wheezing or rales Chest wall: without tenderness to palpitation Heart: PMI not displaced, RRR, s1 s2 normal, 1/6 systolic murmur, no diastolic murmur, no rubs, gallops, thrills, or heaves Abdomen: soft, nontender; no hepatosplenomehaly, BS+; abdominal aorta nontender and not dilated by palpation. Back: no CVA tenderness Pulses 2+ Musculoskeletal: full range of motion, normal strength, no joint deformities Extremities: no clubbing cyanosis or edema, Homan's sign negative  Neurologic: grossly nonfocal; Cranial nerves grossly wnl Psychologic: Normal mood and affect   ECG (independently read by me): NSR at 60; Normal intervals  October 18, 2017 ECG (independently read by me): Sinus rhythm at 64 bpm.  Normal intervals.  No ectopy.  March 2018 ECG (independently read by me): normal sinus rhythm at 61 bpm.  Normal intervals.  No ectopy.  No ST segment changes.  December 2018 ECG (independently read by me):  Sinus bradycardia at 55 bpm.  PR interal 168 ms.  QTc inteval 417 ms. No ST-T changes.  February 2018 ECG (independently read by me): Sinus bradycardia at 48 bpm.  No ST segment changes.  Normal intervals.  June 2017 ECG (independently read by me): Sinus bradycardia 58 bpm.  Normal intervals.  No ST segment changes.  September 2016 ECG (independently read by me): Sinus bradycardia 58 bpm.  Mild RV conduction delay.  September 2015 ECG:( Independently read by me): Sinus bradycardia 50 beats per minute.  No ST segment changes.  Normal intervals.   November 2014 ECG: Sinus bradycardia at 45 beats per minute. No significant ST-T change.  LABS: BMP Latest Ref Rng & Units 03/07/2019 12/06/2017 12/05/2017  Glucose 70 - 99 mg/dL 177(H) 124(H) 151(H)  BUN 6 - 20 mg/dL '16 16 14  '$ Creatinine 0.61 - 1.24 mg/dL 0.82 1.08 1.04  BUN/Creat Ratio 9  - 20 - - -  Sodium 135 - 145 mmol/L 136 140 138  Potassium 3.5 - 5.1 mmol/L 3.6 3.8 3.8  Chloride 98 - 111 mmol/L 104 104 103  CO2 22 - 32 mmol/L '22 26 26  '$ Calcium 8.9 - 10.3 mg/dL 9.5 9.7 9.4   Hepatic Function Latest Ref Rng & Units 10/12/2018 10/10/2017 08/01/2017  Total Protein 6.0 - 8.3 g/dL 7.5 7.5 7.6  Albumin 3.5 - 5.2 g/dL 4.6 4.2 4.5  AST 0 - 37 U/L '15 25 21  '$ ALT 0 - 53 U/L '13 18 16  '$ Alk Phosphatase 39 - 117 U/L 52 53 60  Total Bilirubin 0.2 - 1.2 mg/dL 1.2 1.0 0.7  Bilirubin, Direct 0.0 - 0.3 mg/dL 0.3 - -   CBC Latest Ref Rng & Units  03/07/2019 12/06/2017 12/05/2017  WBC 4.0 - 10.5 K/uL 6.5 7.7 7.2  Hemoglobin 13.0 - 17.0 g/dL 14.7 14.9 14.6  Hematocrit 39.0 - 52.0 % 43.2 44.1 41.0  Platelets 150 - 400 K/uL 207 199 198.0   Lab Results  Component Value Date   MCV 93.7 03/07/2019   MCV 93.6 12/06/2017   MCV 91.6 12/05/2017   Lab Results  Component Value Date   TSH 1.71 12/05/2017   Lab Results  Component Value Date   HGBA1C 6.5 10/12/2018   Lipid Panel     Component Value Date/Time   CHOL 112 10/12/2018 0852   CHOL 105 08/01/2017 0813   CHOL 99 04/02/2013 0903   TRIG 97.0 10/12/2018 0852   TRIG 59 04/02/2013 0903   HDL 39.80 10/12/2018 0852   HDL 37 (L) 08/01/2017 0813   HDL 42 04/02/2013 0903   CHOLHDL 3 10/12/2018 0852   VLDL 19.4 10/12/2018 0852   LDLCALC 53 10/12/2018 0852   LDLCALC 49 08/01/2017 0813   LDLCALC 45 04/02/2013 0903     RADIOLOGY: No results found.  IMPRESSION:  1. Paroxysmal atrial fibrillation (HCC)   2. Coronary artery disease involving native coronary artery of native heart with angina pectoris [facility]): LAD stent 2005.   3. Essential hypertension   4. Hyperlipidemia with target LDL less than 70   5. Anticoagulated    ASSESSMENT AND PLAN: Michael Livingston is a 58 year old white male who is 15 years status post stenting to his proximal to mid LAD in October 2005.  Catheterization in October 2007 demonstrated a widely patent LAD  stent and he had mild 30% narrowing.  A nuclear perfusion study in January 2013 continued to show normal perfusion.  He has not had any recurrent anginal symptomatology. He apparently developed an episode of atrial fibrillation with RVR leading to a 01/19/2017 ER evaluation.  He had new onset of symptomatology and was successfully cardioverted in the emergency room.  At that time his beta blocker regimen was increased and he was started on Xarelto and Plavix was discontinued.  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  When I saw him in March 2019, he had stated that he had banged his head working on a roof and was concerned about recurrent potential head trauma particularly on anticoagulation.  At that time, since he had not had any recurrent atrial fibrillation episodes his Xarelto was discontinued and he was switched back to aspirin and Plavix.  He was only taking short acting metoprolol once a day instead of the prescribed twice a day dosing and this was switched to metoprolol succinate.  He had been doing well until early June 2019  when in the extreme heat while working on a roof and while stressed and dehydrated he noticed increased heart rate and was found to have recurrent AF.  I saw him in the emergency room shortly after he presented and ventricular rate was in the 150s.  In the ER, he was successfully cardioverted since his AF had only been of several hours duration.  He was restarted back on Xarelto and Plavix and aspirin were discontinued. Repeat catheterization was performed in August 2019 after he had developed symptoms of increasing shortness of breath and vague chest pressure.  Catheterization was essentially unchanged from previously and his LAD stent was widely patent.  March 07, 2019 he again developed another episode of atrial fibrillation which occurred in the setting of increasing stress when he was driving his father to  our office after he had had a short blackout spell earlier  that morning and again had a syncopal spell awaiting to come to our office.  He had taken him to the emergency room from our office and finally when he went home again developed sensation of increasing palpitations and was found to have recurrent atrial fibrillation.  He again underwent successful DC cardioversion.  Michael Livingston has now had 3 documented episodes of atrial fibrillation since September 2018.  His last echo Doppler study in June 2019 showed an EF of 55 to 60%.  There was grade 2 diastolic dysfunction.  There was mild right atrial dilatation and apparently his left atrium was normal in size.  I am recommending he undergo a follow-up echo Doppler evaluation.  I have suggested he see Dr. Thompson Grayer for consideration of possible ablation for his atrial fibrillation.  At present I will not add any antiarrhythmic therapy but will defer this to Dr. Rayann Heman.  His blood pressure today is well controlled on his current regimen consisting of lisinopril 2.5 mg, Toprol-XL 37.5 mg daily.  I suggested the possibility of slightly increasing metoprolol to 50 mg but he did not want to do this and actually was hoping that his dose could be reduced.  We will try to arrange his echo Doppler study over the next several weeks with visit with Dr. Rayann Heman over the next several weeks.  I will see him in 3 to 4 months for reevaluation..  Time spent: 25 minutes Troy Sine, MD, [facility]  03/21/2019 6:48 PM

## 2019-03-22 ENCOUNTER — Other Ambulatory Visit: Payer: Self-pay | Admitting: Family Medicine

## 2019-03-23 NOTE — ED Notes (Signed)
Late entry, time out completed at 01:12

## 2019-03-25 ENCOUNTER — Other Ambulatory Visit: Payer: Self-pay | Admitting: Family Medicine

## 2019-04-01 ENCOUNTER — Ambulatory Visit (HOSPITAL_COMMUNITY): Payer: BC Managed Care – PPO | Attending: Cardiovascular Disease

## 2019-04-01 ENCOUNTER — Other Ambulatory Visit: Payer: Self-pay

## 2019-04-01 DIAGNOSIS — I480 Paroxysmal atrial fibrillation: Secondary | ICD-10-CM | POA: Insufficient documentation

## 2019-04-03 ENCOUNTER — Telehealth: Payer: Self-pay

## 2019-04-03 NOTE — Telephone Encounter (Signed)
Left a message regarding appt on 04/08/19.

## 2019-04-08 ENCOUNTER — Encounter: Payer: Self-pay | Admitting: Internal Medicine

## 2019-04-08 ENCOUNTER — Telehealth: Payer: Self-pay

## 2019-04-08 ENCOUNTER — Other Ambulatory Visit: Payer: Self-pay

## 2019-04-08 ENCOUNTER — Telehealth (INDEPENDENT_AMBULATORY_CARE_PROVIDER_SITE_OTHER): Payer: BC Managed Care – PPO | Admitting: Internal Medicine

## 2019-04-08 VITALS — Ht 74.0 in | Wt 200.0 lb

## 2019-04-08 DIAGNOSIS — Z01810 Encounter for preprocedural cardiovascular examination: Secondary | ICD-10-CM

## 2019-04-08 DIAGNOSIS — I480 Paroxysmal atrial fibrillation: Secondary | ICD-10-CM

## 2019-04-08 DIAGNOSIS — I4819 Other persistent atrial fibrillation: Secondary | ICD-10-CM

## 2019-04-08 DIAGNOSIS — I4811 Longstanding persistent atrial fibrillation: Secondary | ICD-10-CM

## 2019-04-08 DIAGNOSIS — I4891 Unspecified atrial fibrillation: Secondary | ICD-10-CM

## 2019-04-08 DIAGNOSIS — Z79899 Other long term (current) drug therapy: Secondary | ICD-10-CM

## 2019-04-08 DIAGNOSIS — Z7901 Long term (current) use of anticoagulants: Secondary | ICD-10-CM

## 2019-04-08 DIAGNOSIS — I10 Essential (primary) hypertension: Secondary | ICD-10-CM | POA: Diagnosis not present

## 2019-04-08 NOTE — Patient Instructions (Signed)
Medication Instructions:  Your physician recommends that you continue on your current medications as directed. Please refer to the Current Medication list given to you today.  *If you need a refill on your cardiac medications before your next appointment, please call your pharmacy*  Lab Work: PRIOR TO YOUR CT THAT WILL BE [place] If you have labs (blood work) drawn today and your tests are completely normal, you will receive your results only by: Marland Kitchen MyChart Message (if you have MyChart) OR . A paper copy in the mail If you have any lab test that is abnormal or we need to change your treatment, we will call you to review the results.  Testing/Procedure:   Your cardiac CT will be scheduled at one of the below locations:   [facility] [address] [place], [place] [address]  If scheduled at [facility], please arrive at the [facility] main entrance of [facility] 30-45 minutes prior to test start time. Proceed to the [facility] Radiology Department (first floor) to check-in and test prep.  Please follow these instructions carefully (unless otherwise directed):  Hold all erectile dysfunction medications at least 3 days (72 hrs) prior to test.  On the Night Before the Test: . Be sure to Drink plenty of water. . Do not consume any caffeinated/decaffeinated beverages or chocolate 12 hours prior to your test. . Do not take any antihistamines 12 hours prior to your test . Do not take your Metformin 24 hours prior to your CT  On the Day of the Test: . Drink plenty of water. Do not drink any water within one hour of the test. . Do not eat any food 4 hours prior to the test. . You may take your regular medications prior to the test.  . Take metoprolol two hours prior to test. . HOLD Furosemide/Hydrochlorothiazide morning of the test.  DO NOT TAKE METFORMIN 48 HOURS AFTER YOUR CT       After the Test: . Drink plenty of water. . After receiving IV  contrast, you may experience a mild flushed feeling. This is normal. . On occasion, you may experience a mild rash up to 24 hours after the test. This is not dangerous. If this occurs, you can take Benadryl 25 mg and increase your fluid intake. . If you experience trouble breathing, this can be serious. If it is severe call 911 IMMEDIATELY. If it is mild, please call our office. . If you take any of these medications: Glipizide/Metformin, Avandament, Glucavance, please do not take 48 hours after completing test unless otherwise instructed.   Once we have confirmed authorization from your insurance company, we will call you to set up a date and time for your test.   For non-scheduling related questions, please contact the cardiac imaging nurse navigator should you have any questions/concerns: Marchia Bond, RN Navigator Cardiac Imaging [facility] [phone] Office      Your physician has recommended that you have an ablation. Catheter ablation is a medical procedure used to treat some cardiac arrhythmias (irregular heartbeats). During catheter ablation, a long, thin, flexible tube is put into a blood vessel in your groin (upper thigh), or neck. This tube is called an ablation catheter. It is then guided to your heart through the blood vessel. Radio frequency waves destroy small areas of heart tissue where abnormal heartbeats may cause an arrhythmia to start. Please see the instruction sheet given to you today THROUGH MY CHART.

## 2019-04-08 NOTE — Progress Notes (Signed)
Cardiac CT

## 2019-04-08 NOTE — H&P (View-Only) (Signed)
Electrophysiology TeleHealth Note   Due to national recommendations of social distancing due to [place] 19, Audio telehealth visit is felt to be most appropriate for this patient at this time.  See MyChart message from today for patient consent regarding telehealth for [name].   Date:  04/08/2019   ID:  Michael Livingston, DOB Aug 27, 1960, MRN [id number]  Location: home Provider location: [facility] Evaluation Performed: New patient consult  PCP:  Eulas Post, MD  Cardiologist:  Shelva Majestic, MD  Electrophysiologist:  None   Chief Complaint:  afib  History of Present Illness:    Michael Livingston is a 58 y.o. male who presents via audio conferencing for a telehealth visit today.   The patient is referred for new consultation regarding afib by Dr Claiborne Billings.  He was initially diagnosed with atrial fibrillation in 2018.  He has had at least four episodes of afib requiring cardioversion.  He has been placed on xarelto.  He takes diltiazem as a pill in pocket approach.  He has not tried AAD therapy. He reports symptoms of palpitations and fatigue during his afib.  He has discomfort at times in his chest.   Cath 12/15/2017 reveals no significant CAD.  Today, he denies symptoms of palpitations, chest pain, shortness of breath, orthopnea, PND, lower extremity edema, claudication, dizziness, presyncope, syncope, bleeding, or neurologic sequela. The patient is tolerating medications without difficulties and is otherwise without complaint today.   he denies symptoms of cough, fevers, chills, or new SOB worrisome for COVID 19.   Past Medical History:  Diagnosis Date  . Allergy   . Atrial fibrillation ([place])    a. diagnosed in 01/2017 --> s/p DCCV. Started on Xarelto for anticoagulation.   Marland Kitchen CAD (coronary artery disease)    a. s/p DES to LAD in 2005 b. patent by cath in 2007 with 30% LCx stenosis noted at that time c. normal NST in 05/2011  . Chronic kidney disease    1 kidney  stone  . Diabetes mellitus without complication (HCC)    type 2, no meds needed  . Hyperlipidemia   . Hypertension   . Myocardial infarction [facility])     Past Surgical History:  Procedure Laterality Date  . CARDIAC SURGERY  2005   stent  . CORONARY STENT PLACEMENT    . KNEE ARTHROSCOPY     left 2003, right 2005  . LEFT HEART CATH AND CORONARY ANGIOGRAPHY N/A 12/15/2017   Procedure: LEFT HEART CATH AND CORONARY ANGIOGRAPHY;  Surgeon: Troy Sine, MD;  Location: [place] [facility];  Service: Cardiovascular;  Laterality: N/A;  . SHOULDER SURGERY      Current Outpatient Medications  Medication Sig Dispense Refill  . diltiazem (CARDIZEM) 30 MG tablet Take 1 tablet (30 mg total) by mouth daily as needed. 90 tablet 3  . doxycycline (VIBRA-TABS) 100 MG tablet Take 1 tablet (100 mg total) by mouth 2 (two) times daily. 20 tablet 0  . ezetimibe-simvastatin (VYTORIN) 10-20 MG tablet Take 1 tablet by mouth daily. 90 tablet 1  . famotidine (PEPCID) 20 MG tablet Take 1 tablet (20 mg total) by mouth 2 (two) times daily. 90 tablet 3  . lisinopril (ZESTRIL) 2.5 MG tablet Take 1 tablet (2.5 mg total) by mouth daily. 90 tablet 3  . metFORMIN (GLUCOPHAGE) 500 MG tablet Take 1 tablet (500 mg total) by mouth 2 (two) times daily with a meal. 180 tablet 0  . metoprolol succinate (TOPROL XL) 25 MG  24 hr tablet Take 1.5 tablets (37.5 mg total) by mouth daily. 270 tablet 3  . montelukast (SINGULAIR) 10 MG tablet Take 1 tablet (10 mg total) by mouth at bedtime. 90 tablet 3  . Multiple Vitamins-Minerals (MULTIVITAMIN WITH MINERALS) tablet Take 1 tablet by mouth at bedtime.     . nitroGLYCERIN (NITROSTAT) 0.4 MG SL tablet Place 1 tablet (0.4 mg total) under the tongue every 5 (five) minutes as needed for chest pain. 25 tablet 2  . pantoprazole (PROTONIX) 40 MG tablet Take 1 tablet (40 mg total) by mouth daily. Take 30-60 min before first meal of the day 90 tablet 3  . PAZEO 0.7 % SOLN INSTILL 1 DROP INTO INTO BOTH  EYES EVERY MORNING AS NEEDED FOR ITCHING  4  . sildenafil (VIAGRA) 100 MG tablet Take 0.5-1 tablets (50-100 mg total) by mouth daily as needed for erectile dysfunction. 15 tablet 3  . traZODone (DESYREL) 50 MG tablet TAKE 0.5-1 TABLETS (25-50 MG TOTAL) BY MOUTH AT BEDTIME AS NEEDED FOR SLEEP. 90 tablet 1  . XARELTO 20 MG TABS tablet TAKE 1 TABLET DAILY WITH   SUPPER 90 tablet 1   No current facility-administered medications for this visit.     Allergies:   Aleve [naproxen sodium], Oxycodone, Sulfa antibiotics, and Vicodin [hydrocodone-acetaminophen]   Social History:  The patient  reports that he quit smoking about 31 years ago. He has a 8.00 pack-year smoking history. He has never used smokeless tobacco. He reports that he does not drink alcohol or use drugs.   Family History:  The patient's family history includes Diabetes in his father and mother; Heart disease in his father and paternal grandfather.    ROS:  Please see the history of present illness.   All other systems are personally reviewed and negative.    Exam:    Vital Signs:  Ht 6\' 2"  (1.88 m)   Wt 200 lb (90.7 kg)   BMI 25.68 kg/m   Well sounding , alert and conversant    Labs/Other Tests and Data Reviewed:    Recent Labs: 10/12/2018: ALT 13 03/07/2019: BUN 16; Creatinine, Ser 0.82; Hemoglobin 14.7; Magnesium 2.2; Platelets 207; Potassium 3.6; Sodium 136   Wt Readings from Last 3 Encounters:  04/08/19 200 lb (90.7 kg)  03/21/19 201 lb (91.2 kg)  03/15/19 199 lb (90.3 kg)     Other studies personally reviewed: Additional studies/ records that were reviewed today include: Dr Ermalinda Memos notes, prior ekgs, echo Review of the above records today demonstrates: as above  Echo 04/01/2019- EF 60%, atrial were normal in size, no significant valvular disease    ASSESSMENT & PLAN:    1. Persistent atrial fibrillation The patient has symptomatic, recurrent paroxysmal atrial fibrillation. he has not tried AAD  therapy. Chads2vasc score is 3.  he is anticoagulated with xarelto . Therapeutic strategies for afib including medicine (multaq, tikosyn, amiodarone) and ablation were discussed in detail with the patient today. Risk, benefits, and alternatives to EP study and radiofrequency ablation for afib were also discussed in detail today. These risks include but are not limited to stroke, bleeding, vascular damage, tamponade, perforation, damage to the esophagus, lungs, and other structures, pulmonary vein stenosis, worsening renal function, and death. The patient understands these risk and wishes to proceed.  We will therefore proceed with catheter ablation at the next available time.  Carto, ICE, anesthesia are requested for the procedure.  Will also obtain cardiac CT prior to the procedure to exclude LAA thrombus and  further evaluate atrial anatomy.  2 HTN Stable No change required today  Current medicines are reviewed at length with the patient today.   The patient does not have concerns regarding his medicines.  The following changes were made today:  none  Labs/ tests ordered today include:  No orders of the defined types were placed in this encounter.   Patient Risk:  after full review of this patients clinical status, I feel that they are at high risk at this time.   Today, I have spent 20 minutes with the patient with telehealth technology discussing afib .    Signed, Thompson Grayer MD, [place] 04/08/2019 8:41 AM   [facility] [address] [place] [address] [phone] (office) [phone] (fax)

## 2019-04-08 NOTE — Telephone Encounter (Addendum)
Pt agreed to Afib Ablation with Dr. Rayann Heman on 04/25/19 at 7:30 am...  Covid test 04/22/19 with labs prior to Cardiac CT.   Pt verbalized understanding of his pre-procedure instructions and read the information back to me and letters for ablation and CT sent to his My Chart.

## 2019-04-08 NOTE — Telephone Encounter (Signed)
-----   Message from Thompson Grayer, MD sent at 04/08/2019  8:59 AM EST ----- Schedule afib ablation Carto/ICE/anesthesia  Cardiac CT prior

## 2019-04-08 NOTE — Progress Notes (Signed)
Electrophysiology TeleHealth Note   Due to national recommendations of social distancing due to [place] 19, Audio telehealth visit is felt to be most appropriate for this patient at this time.  See MyChart message from today for patient consent regarding telehealth for [facility].   Date:  04/08/2019   ID:  Michael Livingston, DOB 1960/09/23, MRN [id number]  Location: home Provider location: [facility] Evaluation Performed: New patient consult  PCP:  Michael Post, MD  Cardiologist:  Michael Majestic, MD  Electrophysiologist:  None   Chief Complaint:  afib  History of Present Illness:    Michael Livingston is a 58 y.o. male who presents via audio conferencing for a telehealth visit today.   The patient is referred for new consultation regarding afib by Dr Michael Livingston.  He was initially diagnosed with atrial fibrillation in 2018.  He has had at least four episodes of afib requiring cardioversion.  He has been placed on xarelto.  He takes diltiazem as a pill in pocket approach.  He has not tried AAD therapy. He reports symptoms of palpitations and fatigue during his afib.  He has discomfort at times in his chest.   Cath 12/15/2017 reveals no significant CAD.  Today, he denies symptoms of palpitations, chest pain, shortness of breath, orthopnea, PND, lower extremity edema, claudication, dizziness, presyncope, syncope, bleeding, or neurologic sequela. The patient is tolerating medications without difficulties and is otherwise without complaint today.   he denies symptoms of cough, fevers, chills, or new SOB worrisome for COVID 19.   Past Medical History:  Diagnosis Date  . Allergy   . Atrial fibrillation ([place])    a. diagnosed in 01/2017 --> s/p DCCV. Started on Xarelto for anticoagulation.   Marland Kitchen CAD (coronary artery disease)    a. s/p DES to LAD in 2005 b. patent by cath in 2007 with 30% LCx stenosis noted at that time c. normal NST in 05/2011  . Chronic kidney disease    1 kidney  stone  . Diabetes mellitus without complication (HCC)    type 2, no meds needed  . Hyperlipidemia   . Hypertension   . Myocardial infarction [facility])     Past Surgical History:  Procedure Laterality Date  . CARDIAC SURGERY  2005   stent  . CORONARY STENT PLACEMENT    . KNEE ARTHROSCOPY     left 2003, right 2005  . LEFT HEART CATH AND CORONARY ANGIOGRAPHY N/A 12/15/2017   Procedure: LEFT HEART CATH AND CORONARY ANGIOGRAPHY;  Surgeon: Michael Sine, MD;  Location: [place] [facility];  Service: Cardiovascular;  Laterality: N/A;  . SHOULDER SURGERY      Current Outpatient Medications  Medication Sig Dispense Refill  . diltiazem (CARDIZEM) 30 MG tablet Take 1 tablet (30 mg total) by mouth daily as needed. 90 tablet 3  . doxycycline (VIBRA-TABS) 100 MG tablet Take 1 tablet (100 mg total) by mouth 2 (two) times daily. 20 tablet 0  . ezetimibe-simvastatin (VYTORIN) 10-20 MG tablet Take 1 tablet by mouth daily. 90 tablet 1  . famotidine (PEPCID) 20 MG tablet Take 1 tablet (20 mg total) by mouth 2 (two) times daily. 90 tablet 3  . lisinopril (ZESTRIL) 2.5 MG tablet Take 1 tablet (2.5 mg total) by mouth daily. 90 tablet 3  . metFORMIN (GLUCOPHAGE) 500 MG tablet Take 1 tablet (500 mg total) by mouth 2 (two) times daily with a meal. 180 tablet 0  . metoprolol succinate (TOPROL XL) 25 MG  24 hr tablet Take 1.5 tablets (37.5 mg total) by mouth daily. 270 tablet 3  . montelukast (SINGULAIR) 10 MG tablet Take 1 tablet (10 mg total) by mouth at bedtime. 90 tablet 3  . Multiple Vitamins-Minerals (MULTIVITAMIN WITH MINERALS) tablet Take 1 tablet by mouth at bedtime.     . nitroGLYCERIN (NITROSTAT) 0.4 MG SL tablet Place 1 tablet (0.4 mg total) under the tongue every 5 (five) minutes as needed for chest pain. 25 tablet 2  . pantoprazole (PROTONIX) 40 MG tablet Take 1 tablet (40 mg total) by mouth daily. Take 30-60 min before first meal of the day 90 tablet 3  . PAZEO 0.7 % SOLN INSTILL 1 DROP INTO INTO BOTH  EYES EVERY MORNING AS NEEDED FOR ITCHING  4  . sildenafil (VIAGRA) 100 MG tablet Take 0.5-1 tablets (50-100 mg total) by mouth daily as needed for erectile dysfunction. 15 tablet 3  . traZODone (DESYREL) 50 MG tablet TAKE 0.5-1 TABLETS (25-50 MG TOTAL) BY MOUTH AT BEDTIME AS NEEDED FOR SLEEP. 90 tablet 1  . XARELTO 20 MG TABS tablet TAKE 1 TABLET DAILY WITH   SUPPER 90 tablet 1   No current facility-administered medications for this visit.     Allergies:   Aleve [naproxen sodium], Oxycodone, Sulfa antibiotics, and Vicodin [hydrocodone-acetaminophen]   Social History:  The patient  reports that he quit smoking about 31 years ago. He has a 8.00 pack-year smoking history. He has never used smokeless tobacco. He reports that he does not drink alcohol or use drugs.   Family History:  The patient's family history includes Diabetes in his father and mother; Heart disease in his father and paternal grandfather.    ROS:  Please see the history of present illness.   All other systems are personally reviewed and negative.    Exam:    Vital Signs:  Ht 6\' 2"  (1.88 m)   Wt 200 lb (90.7 kg)   BMI 25.68 kg/m   Well sounding , alert and conversant    Labs/Other Tests and Data Reviewed:    Recent Labs: 10/12/2018: ALT 13 03/07/2019: BUN 16; Creatinine, Ser 0.82; Hemoglobin 14.7; Magnesium 2.2; Platelets 207; Potassium 3.6; Sodium 136   Wt Readings from Last 3 Encounters:  04/08/19 200 lb (90.7 kg)  03/21/19 201 lb (91.2 kg)  03/15/19 199 lb (90.3 kg)     Other studies personally reviewed: Additional studies/ records that were reviewed today include: Dr Ermalinda Memos notes, prior ekgs, echo Review of the above records today demonstrates: as above  Echo 04/01/2019- EF 60%, atrial were normal in size, no significant valvular disease    ASSESSMENT & PLAN:    1. Persistent atrial fibrillation The patient has symptomatic, recurrent paroxysmal atrial fibrillation. he has not tried AAD therapy.  Chads2vasc score is 3.  he is anticoagulated with xarelto . Therapeutic strategies for afib including medicine (multaq, tikosyn, amiodarone) and ablation were discussed in detail with the patient today. Risk, benefits, and alternatives to EP study and radiofrequency ablation for afib were also discussed in detail today. These risks include but are not limited to stroke, bleeding, vascular damage, tamponade, perforation, damage to the esophagus, lungs, and other structures, pulmonary vein stenosis, worsening renal function, and death. The patient understands these risk and wishes to proceed.  We will therefore proceed with catheter ablation at the next available time.  Carto, ICE, anesthesia are requested for the procedure.  Will also obtain cardiac CT prior to the procedure to exclude LAA thrombus and  further evaluate atrial anatomy.  2 HTN Stable No change required today  Current medicines are reviewed at length with the patient today.   The patient does not have concerns regarding his medicines.  The following changes were made today:  none  Labs/ tests ordered today include:  No orders of the defined types were placed in this encounter.   Patient Risk:  after full review of this patients clinical status, I feel that they are at high risk at this time.   Today, I have spent 20 minutes with the patient with telehealth technology discussing afib .    Signed, Thompson Grayer MD, [place] 04/08/2019 8:41 AM   [facility] [address] [place] [address] [phone] (office) [phone] (fax)

## 2019-04-10 ENCOUNTER — Other Ambulatory Visit: Payer: Self-pay

## 2019-04-10 DIAGNOSIS — Z01810 Encounter for preprocedural cardiovascular examination: Secondary | ICD-10-CM

## 2019-04-10 DIAGNOSIS — Z79899 Other long term (current) drug therapy: Secondary | ICD-10-CM

## 2019-04-10 DIAGNOSIS — I4811 Longstanding persistent atrial fibrillation: Secondary | ICD-10-CM

## 2019-04-10 NOTE — Telephone Encounter (Signed)
Lipid and Hepatic panel added per okay from Dr. Rayann Heman with his pre-ablation labs for his PCP Dr. Elease Hashimoto.

## 2019-04-10 NOTE — Telephone Encounter (Signed)
-----   Message from Thompson Grayer, MD sent at 04/09/2019  7:48 AM EST ----- Sure ----- Message ----- From: Stephani Police, RN Sent: 04/08/2019  12:33 PM EST To: Thompson Grayer, MD  Pt is asking if he can have fasting Lipid and Hepatic panel for Dr. Elease Hashimoto his PCP when he has his preprocedure labs .. he is on Vytorin and due for those labs.   ----- Message ----- From: Thompson Grayer, MD Sent: 04/08/2019   8:59 AM EST To: Damian Leavell, RN, Stephani Police, RN  Schedule afib ablation Carto/ICE/anesthesia  Cardiac CT prior

## 2019-04-10 NOTE — Progress Notes (Signed)
c-met  

## 2019-04-12 ENCOUNTER — Other Ambulatory Visit: Payer: BC Managed Care – PPO | Admitting: *Deleted

## 2019-04-12 ENCOUNTER — Other Ambulatory Visit: Payer: Self-pay

## 2019-04-12 DIAGNOSIS — Z7901 Long term (current) use of anticoagulants: Secondary | ICD-10-CM

## 2019-04-12 DIAGNOSIS — Z79899 Other long term (current) drug therapy: Secondary | ICD-10-CM

## 2019-04-12 DIAGNOSIS — I4811 Longstanding persistent atrial fibrillation: Secondary | ICD-10-CM

## 2019-04-12 DIAGNOSIS — I480 Paroxysmal atrial fibrillation: Secondary | ICD-10-CM

## 2019-04-12 DIAGNOSIS — Z01810 Encounter for preprocedural cardiovascular examination: Secondary | ICD-10-CM

## 2019-04-12 DIAGNOSIS — I4819 Other persistent atrial fibrillation: Secondary | ICD-10-CM

## 2019-04-12 LAB — CBC WITH DIFFERENTIAL/PLATELET
Basophils Absolute: 0 10*3/uL (ref 0.0–0.2)
Basos: 1 %
EOS (ABSOLUTE): 0.4 10*3/uL (ref 0.0–0.4)
Eos: 7 %
Hematocrit: 43.6 % (ref 37.5–51.0)
Hemoglobin: 15.5 g/dL (ref 13.0–17.7)
Immature Grans (Abs): 0 10*3/uL (ref 0.0–0.1)
Immature Granulocytes: 0 %
Lymphocytes Absolute: 1.5 10*3/uL (ref 0.7–3.1)
Lymphs: 24 %
MCH: 32.5 pg (ref 26.6–33.0)
MCHC: 35.6 g/dL (ref 31.5–35.7)
MCV: 91 fL (ref 79–97)
Monocytes Absolute: 0.6 10*3/uL (ref 0.1–0.9)
Monocytes: 10 %
Neutrophils Absolute: 3.5 10*3/uL (ref 1.4–7.0)
Neutrophils: 58 %
Platelets: 215 10*3/uL (ref 150–450)
RBC: 4.77 x10E6/uL (ref 4.14–5.80)
RDW: 11.8 % (ref 11.6–15.4)
WBC: 6.1 10*3/uL (ref 3.4–10.8)

## 2019-04-12 LAB — COMPREHENSIVE METABOLIC PANEL
ALT: 14 IU/L (ref 0–44)
AST: 17 IU/L (ref 0–40)
Albumin/Globulin Ratio: 1.6 (ref 1.2–2.2)
Albumin: 4.6 g/dL (ref 3.8–4.9)
Alkaline Phosphatase: 65 IU/L (ref 39–117)
BUN/Creatinine Ratio: 15 (ref 9–20)
BUN: 14 mg/dL (ref 6–24)
Bilirubin Total: 0.9 mg/dL (ref 0.0–1.2)
CO2: 22 mmol/L (ref 20–29)
Calcium: 9.5 mg/dL (ref 8.7–10.2)
Chloride: 102 mmol/L (ref 96–106)
Creatinine, Ser: 0.94 mg/dL (ref 0.76–1.27)
GFR calc Af Amer: 104 mL/min/{1.73_m2} (ref >59)
GFR calc non Af Amer: 90 mL/min/{1.73_m2} (ref >59)
Globulin, Total: 2.8 g/dL (ref 1.5–4.5)
Glucose: 108 mg/dL — ABNORMAL HIGH (ref 65–99)
Potassium: 4.9 mmol/L (ref 3.5–5.2)
Sodium: 140 mmol/L (ref 134–144)
Total Protein: 7.4 g/dL (ref 6.0–8.5)

## 2019-04-12 LAB — LIPID PANEL
Chol/HDL Ratio: 2.9 ratio (ref 0.0–5.0)
Cholesterol, Total: 123 mg/dL (ref 100–199)
HDL: 42 mg/dL (ref >39)
LDL Chol Calc (NIH): 66 mg/dL (ref 0–99)
Triglycerides: 73 mg/dL (ref 0–149)
VLDL Cholesterol Cal: 15 mg/dL (ref 5–40)

## 2019-04-19 ENCOUNTER — Telehealth (HOSPITAL_COMMUNITY): Payer: Self-pay | Admitting: Emergency Medicine

## 2019-04-19 NOTE — Addendum Note (Signed)
Addended by: Willeen Cass A on: 04/19/2019 02:40 PM   Modules accepted: Orders

## 2019-04-19 NOTE — Telephone Encounter (Signed)
Left message on voicemail with name and callback number Kolbey Teichert RN Navigator Cardiac Imaging [facility] [phone] Office [phone] Cell  

## 2019-04-22 ENCOUNTER — Ambulatory Visit (HOSPITAL_COMMUNITY)
Admission: RE | Admit: 2019-04-22 | Discharge: 2019-04-22 | Disposition: A | Discharge status: Home / Self Care | Payer: BC Managed Care – PPO | Source: Ambulatory Visit | Attending: Internal Medicine | Admitting: Internal Medicine

## 2019-04-22 ENCOUNTER — Other Ambulatory Visit (HOSPITAL_COMMUNITY)
Admission: RE | Admit: 2019-04-22 | Discharge: 2019-04-22 | Disposition: A | Discharge status: Home / Self Care | Payer: BC Managed Care – PPO | Source: Ambulatory Visit | Attending: Internal Medicine | Admitting: Internal Medicine

## 2019-04-22 ENCOUNTER — Other Ambulatory Visit: Payer: Self-pay

## 2019-04-22 DIAGNOSIS — I4891 Unspecified atrial fibrillation: Secondary | ICD-10-CM | POA: Diagnosis not present

## 2019-04-22 DIAGNOSIS — I2510 Atherosclerotic heart disease of native coronary artery without angina pectoris: Secondary | ICD-10-CM | POA: Diagnosis not present

## 2019-04-22 DIAGNOSIS — Z20828 Contact with and (suspected) exposure to other viral communicable diseases: Secondary | ICD-10-CM | POA: Diagnosis not present

## 2019-04-22 DIAGNOSIS — Z01812 Encounter for preprocedural laboratory examination: Secondary | ICD-10-CM | POA: Insufficient documentation

## 2019-04-22 LAB — GLUCOSE, CAPILLARY: Glucose-Capillary: 86 mg/dL (ref 70–99)

## 2019-04-22 MED ORDER — IOHEXOL 350 MG/ML SOLN
80.0000 mL | Freq: Once | INTRAVENOUS | Status: AC | PRN
  Administered 2019-04-22: 80 mL via INTRAVENOUS

## 2019-04-23 LAB — NOVEL CORONAVIRUS, NAA (HOSP ORDER, SEND-OUT TO REF LAB; TAT 18-24 HRS): SARS-CoV-2, NAA: NOT DETECTED

## 2019-04-24 ENCOUNTER — Encounter (HOSPITAL_COMMUNITY): Payer: Self-pay | Admitting: Certified Registered Nurse Anesthetist

## 2019-04-24 NOTE — Anesthesia Preprocedure Evaluation (Addendum)
Anesthesia Evaluation  Patient identified by MRN, date of birth, ID band Patient awake    Reviewed: Allergy & Precautions, H&P , NPO status , Patient's Chart, lab work & pertinent test results, reviewed documented beta blocker date and time   Airway Mallampati: I  TM Distance: >3 FB Neck ROM: Full    Dental no notable dental hx. (+) Teeth Intact, Dental Advisory Given   Pulmonary neg pulmonary ROS, former smoker,    Pulmonary exam normal breath sounds clear to auscultation       Cardiovascular Exercise Tolerance: Good hypertension, Pt. on medications and Pt. on home beta blockers + CAD, + Past MI and + Cardiac Stents  + dysrhythmias Atrial Fibrillation  Rhythm:Regular Rate:Normal     Neuro/Psych negative neurological ROS  negative psych ROS   GI/Hepatic negative GI ROS, Neg liver ROS,   Endo/Other  diabetes, Type 2, Oral Hypoglycemic Agents  Renal/GU negative Renal ROS  negative genitourinary   Musculoskeletal   Abdominal   Peds  Hematology negative hematology ROS (+)   Anesthesia Other Findings   Reproductive/Obstetrics negative OB ROS                            Anesthesia Physical Anesthesia Plan  ASA: III  Anesthesia Plan: General   Post-op Pain Management:    Induction: Intravenous  PONV Risk Score and Plan: 3 and Ondansetron, Dexamethasone and Midazolam  Airway Management Planned: Oral ETT  Additional Equipment:   Intra-op Plan:   Post-operative Plan: Extubation in OR  Informed Consent: I have reviewed the patients History and Physical, chart, labs and discussed the procedure including the risks, benefits and alternatives for the proposed anesthesia with the patient or authorized representative who has indicated his/her understanding and acceptance.     Dental advisory given  Plan Discussed with: CRNA  Anesthesia Plan Comments:         Anesthesia Quick  Evaluation

## 2019-04-25 ENCOUNTER — Encounter (HOSPITAL_COMMUNITY)
Admission: RE | Disposition: A | Discharge status: Home / Self Care | Payer: Self-pay | Source: Home / Self Care | Attending: Internal Medicine

## 2019-04-25 ENCOUNTER — Other Ambulatory Visit: Payer: Self-pay

## 2019-04-25 ENCOUNTER — Ambulatory Visit (HOSPITAL_COMMUNITY)
Admission: RE | Admit: 2019-04-25 | Discharge: 2019-04-25 | Disposition: A | Discharge status: Home / Self Care | Payer: BC Managed Care – PPO | Attending: Internal Medicine | Admitting: Internal Medicine

## 2019-04-25 ENCOUNTER — Telehealth: Payer: Self-pay

## 2019-04-25 DIAGNOSIS — Z538 Procedure and treatment not carried out for other reasons: Secondary | ICD-10-CM | POA: Diagnosis not present

## 2019-04-25 DIAGNOSIS — Z01818 Encounter for other preprocedural examination: Secondary | ICD-10-CM | POA: Diagnosis present

## 2019-04-25 LAB — GLUCOSE, CAPILLARY: Glucose-Capillary: 125 mg/dL — ABNORMAL HIGH (ref 70–99)

## 2019-04-25 SURGERY — ATRIAL FIBRILLATION ABLATION
Anesthesia: General

## 2019-04-25 MED ORDER — SODIUM CHLORIDE 0.9 % IV SOLN: INTRAVENOUS | Status: DC

## 2019-04-25 MED ORDER — ACETAMINOPHEN 500 MG PO TABS
1000.0000 mg | ORAL_TABLET | Freq: Once | ORAL | Status: AC
  Administered 2019-04-25: 1000 mg via ORAL
  Filled 2019-04-25: qty 2

## 2019-04-25 NOTE — H&P (Signed)
The patient ate this morning at 4:30.  Per anesthesia, we cannot sedate him for 6 hours.  Due to limitations of our schedule, we will need to reschedule for another day.  Thompson Grayer MD, [facility] 04/25/2019 7:19 AM

## 2019-04-25 NOTE — Telephone Encounter (Signed)
-----   Message from Thompson Grayer, MD sent at 04/25/2019  7:19 AM EST ----- He had crackers at 4:30 am.  We need to reschedule.  May be I could do the first Tuesday when I return

## 2019-04-26 NOTE — Telephone Encounter (Signed)
Call placed to Pt.  Pt rescheduled for afib ablation on 12/29 at 9:30 am  Reiterated all instructions and updated instruction letter via mychart  Pt indicates understanding- no food after midnight and  NO morning medications.  Rescheduled covid test  Work up complete.

## 2019-04-30 ENCOUNTER — Other Ambulatory Visit: Payer: Self-pay

## 2019-04-30 ENCOUNTER — Telehealth: Payer: Self-pay

## 2019-04-30 DIAGNOSIS — H9313 Tinnitus, bilateral: Secondary | ICD-10-CM

## 2019-04-30 NOTE — Telephone Encounter (Signed)
Called patient and let him know that I have sent in the referral and to check back if he has not heard anything. Patient stated that the ringing is persistent and not improving.

## 2019-04-30 NOTE — Telephone Encounter (Signed)
Copied from [place] [phone]. Topic: Referral - Request for Referral >> Apr 30, 2019  8:39 AM Rainey Pines A wrote: Has patient seen PCP for this complaint? Yes *If NO, is insurance requiring patient see PCP for this issue before PCP can refer them? Referral for which specialty: Audiologist Preferred provider/office: No preference Reason for referral: Ringing in both ears

## 2019-04-30 NOTE — Telephone Encounter (Signed)
Please advise 

## 2019-04-30 NOTE — Telephone Encounter (Signed)
Referral has been sent.

## 2019-04-30 NOTE — Telephone Encounter (Signed)
OK to refer to Audiology

## 2019-05-04 ENCOUNTER — Other Ambulatory Visit: Payer: Self-pay | Admitting: Family Medicine

## 2019-05-04 ENCOUNTER — Other Ambulatory Visit (HOSPITAL_COMMUNITY)
Admission: RE | Admit: 2019-05-04 | Discharge: 2019-05-04 | Disposition: A | Discharge status: Home / Self Care | Payer: BC Managed Care – PPO | Source: Ambulatory Visit | Attending: Internal Medicine | Admitting: Internal Medicine

## 2019-05-04 DIAGNOSIS — Z20828 Contact with and (suspected) exposure to other viral communicable diseases: Secondary | ICD-10-CM | POA: Insufficient documentation

## 2019-05-04 DIAGNOSIS — Z01812 Encounter for preprocedural laboratory examination: Secondary | ICD-10-CM | POA: Insufficient documentation

## 2019-05-04 LAB — SARS CORONAVIRUS 2 (TAT 6-24 HRS): SARS Coronavirus 2: NEGATIVE

## 2019-05-07 ENCOUNTER — Encounter (HOSPITAL_COMMUNITY)
Admission: RE | Disposition: A | Discharge status: Home / Self Care | Payer: BC Managed Care – PPO | Source: Home / Self Care | Attending: Internal Medicine

## 2019-05-07 ENCOUNTER — Ambulatory Visit (HOSPITAL_COMMUNITY)
Admission: RE | Admit: 2019-05-07 | Discharge: 2019-05-07 | Disposition: A | Discharge status: Home / Self Care | Payer: BC Managed Care – PPO | Attending: Internal Medicine | Admitting: Internal Medicine

## 2019-05-07 ENCOUNTER — Ambulatory Visit (HOSPITAL_COMMUNITY): Payer: BC Managed Care – PPO | Admitting: Anesthesiology

## 2019-05-07 ENCOUNTER — Other Ambulatory Visit: Payer: Self-pay

## 2019-05-07 ENCOUNTER — Encounter (HOSPITAL_COMMUNITY): Payer: Self-pay | Admitting: Internal Medicine

## 2019-05-07 DIAGNOSIS — Z79899 Other long term (current) drug therapy: Secondary | ICD-10-CM | POA: Insufficient documentation

## 2019-05-07 DIAGNOSIS — Z886 Allergy status to analgesic agent status: Secondary | ICD-10-CM | POA: Diagnosis not present

## 2019-05-07 DIAGNOSIS — Z7901 Long term (current) use of anticoagulants: Secondary | ICD-10-CM | POA: Insufficient documentation

## 2019-05-07 DIAGNOSIS — E1122 Type 2 diabetes mellitus with diabetic chronic kidney disease: Secondary | ICD-10-CM | POA: Insufficient documentation

## 2019-05-07 DIAGNOSIS — N189 Chronic kidney disease, unspecified: Secondary | ICD-10-CM | POA: Diagnosis not present

## 2019-05-07 DIAGNOSIS — E785 Hyperlipidemia, unspecified: Secondary | ICD-10-CM | POA: Diagnosis not present

## 2019-05-07 DIAGNOSIS — Z87891 Personal history of nicotine dependence: Secondary | ICD-10-CM | POA: Diagnosis not present

## 2019-05-07 DIAGNOSIS — Z885 Allergy status to narcotic agent status: Secondary | ICD-10-CM | POA: Insufficient documentation

## 2019-05-07 DIAGNOSIS — Z7984 Long term (current) use of oral hypoglycemic drugs: Secondary | ICD-10-CM | POA: Insufficient documentation

## 2019-05-07 DIAGNOSIS — I129 Hypertensive chronic kidney disease with stage 1 through stage 4 chronic kidney disease, or unspecified chronic kidney disease: Secondary | ICD-10-CM | POA: Diagnosis not present

## 2019-05-07 DIAGNOSIS — Z882 Allergy status to sulfonamides status: Secondary | ICD-10-CM | POA: Diagnosis not present

## 2019-05-07 DIAGNOSIS — I2510 Atherosclerotic heart disease of native coronary artery without angina pectoris: Secondary | ICD-10-CM | POA: Insufficient documentation

## 2019-05-07 DIAGNOSIS — I4819 Other persistent atrial fibrillation: Secondary | ICD-10-CM | POA: Insufficient documentation

## 2019-05-07 DIAGNOSIS — I252 Old myocardial infarction: Secondary | ICD-10-CM | POA: Insufficient documentation

## 2019-05-07 DIAGNOSIS — I480 Paroxysmal atrial fibrillation: Secondary | ICD-10-CM

## 2019-05-07 DIAGNOSIS — Z955 Presence of coronary angioplasty implant and graft: Secondary | ICD-10-CM | POA: Insufficient documentation

## 2019-05-07 HISTORY — PX: ATRIAL FIBRILLATION ABLATION: EP1191

## 2019-05-07 LAB — GLUCOSE, CAPILLARY
Glucose-Capillary: 97 mg/dL (ref 70–99)
Glucose-Capillary: 119 mg/dL — ABNORMAL HIGH (ref 70–99)

## 2019-05-07 SURGERY — ATRIAL FIBRILLATION ABLATION
Anesthesia: General

## 2019-05-07 MED ORDER — ROCURONIUM BROMIDE 10 MG/ML (PF) SYRINGE
PREFILLED_SYRINGE | INTRAVENOUS | Status: DC | PRN
  Administered 2019-05-07: 20 mg via INTRAVENOUS
  Administered 2019-05-07: 100 mg via INTRAVENOUS

## 2019-05-07 MED ORDER — DEXAMETHASONE SODIUM PHOSPHATE 10 MG/ML IJ SOLN
INTRAMUSCULAR | Status: DC | PRN
  Administered 2019-05-07: 10 mg via INTRAVENOUS

## 2019-05-07 MED ORDER — SODIUM CHLORIDE 0.9 % IV SOLN: 250.0000 mL | INTRAVENOUS | Status: DC | PRN

## 2019-05-07 MED ORDER — ONDANSETRON HCL 4 MG/2ML IJ SOLN
INTRAMUSCULAR | Status: DC | PRN
  Administered 2019-05-07: 4 mg via INTRAVENOUS

## 2019-05-07 MED ORDER — ACETAMINOPHEN 500 MG PO TABS
1000.0000 mg | ORAL_TABLET | Freq: Once | ORAL | Status: AC
  Administered 2019-05-07: 1000 mg via ORAL
  Filled 2019-05-07: qty 2

## 2019-05-07 MED ORDER — SODIUM CHLORIDE 0.9% FLUSH: 3.0000 mL | Freq: Two times a day (BID) | INTRAVENOUS | Status: DC

## 2019-05-07 MED ORDER — HEPARIN (PORCINE) IN NACL 2000-0.9 UNIT/L-% IV SOLN: INTRAVENOUS | Status: DC | PRN

## 2019-05-07 MED ORDER — HEPARIN (PORCINE) IN NACL 1000-0.9 UT/500ML-% IV SOLN
INTRAVENOUS | Status: DC | PRN
  Administered 2019-05-07: 500 mL

## 2019-05-07 MED ORDER — HEPARIN (PORCINE) IN NACL 1000-0.9 UT/500ML-% IV SOLN
INTRAVENOUS | Status: AC
  Filled 2019-05-07: qty 500

## 2019-05-07 MED ORDER — SUGAMMADEX SODIUM 200 MG/2ML IV SOLN
INTRAVENOUS | Status: DC | PRN
  Administered 2019-05-07: 200 mg via INTRAVENOUS

## 2019-05-07 MED ORDER — PHENYLEPHRINE HCL-NACL 10-0.9 MG/250ML-% IV SOLN
INTRAVENOUS | Status: DC | PRN
  Administered 2019-05-07: 15 ug/min via INTRAVENOUS

## 2019-05-07 MED ORDER — ISOPROTERENOL HCL 0.2 MG/ML IJ SOLN
INTRAVENOUS | Status: DC | PRN
  Administered 2019-05-07: 20 ug/min via INTRAVENOUS

## 2019-05-07 MED ORDER — HEPARIN SODIUM (PORCINE) 1000 UNIT/ML IJ SOLN
INTRAMUSCULAR | Status: AC
  Filled 2019-05-07: qty 2

## 2019-05-07 MED ORDER — LIDOCAINE 2% (20 MG/ML) 5 ML SYRINGE
INTRAMUSCULAR | Status: DC | PRN
  Administered 2019-05-07: 100 mg via INTRAVENOUS

## 2019-05-07 MED ORDER — HEPARIN SODIUM (PORCINE) 1000 UNIT/ML IJ SOLN
INTRAMUSCULAR | Status: DC | PRN
  Administered 2019-05-07: 5000 [IU] via INTRAVENOUS

## 2019-05-07 MED ORDER — HEPARIN SODIUM (PORCINE) 1000 UNIT/ML IJ SOLN
INTRAMUSCULAR | Status: DC | PRN
  Administered 2019-05-07: 14000 [IU] via INTRAVENOUS
  Administered 2019-05-07: 1000 [IU] via INTRAVENOUS

## 2019-05-07 MED ORDER — APIXABAN 5 MG PO TABS: 5.0000 mg | ORAL_TABLET | Freq: Once | ORAL | Status: DC

## 2019-05-07 MED ORDER — SODIUM CHLORIDE 0.9 % IV SOLN: INTRAVENOUS | Status: DC

## 2019-05-07 MED ORDER — PROPOFOL 10 MG/ML IV BOLUS
INTRAVENOUS | Status: DC | PRN
  Administered 2019-05-07: 150 mg via INTRAVENOUS

## 2019-05-07 MED ORDER — SODIUM CHLORIDE 0.9% FLUSH: 3.0000 mL | INTRAVENOUS | Status: DC | PRN

## 2019-05-07 MED ORDER — ISOPROTERENOL HCL 0.2 MG/ML IJ SOLN
INTRAMUSCULAR | Status: AC
  Filled 2019-05-07: qty 5

## 2019-05-07 MED ORDER — ONDANSETRON HCL 4 MG/2ML IJ SOLN: 4.0000 mg | Freq: Four times a day (QID) | INTRAMUSCULAR | Status: DC | PRN

## 2019-05-07 MED ORDER — PROTAMINE SULFATE 10 MG/ML IV SOLN
INTRAVENOUS | Status: DC | PRN
  Administered 2019-05-07: 40 mg via INTRAVENOUS

## 2019-05-07 MED ORDER — MIDAZOLAM HCL 5 MG/5ML IJ SOLN
INTRAMUSCULAR | Status: DC | PRN
  Administered 2019-05-07: 2 mg via INTRAVENOUS

## 2019-05-07 MED ORDER — ACETAMINOPHEN 325 MG PO TABS
650.0000 mg | ORAL_TABLET | ORAL | Status: DC | PRN
  Filled 2019-05-07: qty 2

## 2019-05-07 MED ORDER — FENTANYL CITRATE (PF) 100 MCG/2ML IJ SOLN
INTRAMUSCULAR | Status: DC | PRN
  Administered 2019-05-07 (×2): 25 ug via INTRAVENOUS
  Administered 2019-05-07: 50 ug via INTRAVENOUS

## 2019-05-07 SURGICAL SUPPLY — 19 items
BLANKET WARM UNDERBOD FULL ACC (MISCELLANEOUS) ×3 IMPLANT
CATH MAPPNG PENTARAY F 2-6-2MM (CATHETERS) IMPLANT
CATH SMTCH THERMOCOOL SF DF (CATHETERS) ×2 IMPLANT
CATH SOUNDSTAR ECO REPROCESSED (CATHETERS) ×2 IMPLANT
CATH WEBSTER BI DIR CS D-F CRV (CATHETERS) ×2 IMPLANT
COVER SWIFTLINK CONNECTOR (BAG) ×3 IMPLANT
DEVICE CLOSURE PERCLS PRGLD 6F (VASCULAR PRODUCTS) IMPLANT
NDL BAYLIS TRANSSEPTAL 71CM (NEEDLE) IMPLANT
NEEDLE BAYLIS TRANSSEPTAL 71CM (NEEDLE) ×3 IMPLANT
PACK EP LATEX FREE (CUSTOM PROCEDURE TRAY) ×3
PACK EP LF (CUSTOM PROCEDURE TRAY) ×1 IMPLANT
PAD PRO RADIOLUCENT 2001M-C (PAD) ×3 IMPLANT
PATCH CARTO3 (PAD) ×2 IMPLANT
PENTARAY F 2-6-2MM (CATHETERS) ×3
PERCLOSE PROGLIDE 6F (VASCULAR PRODUCTS) ×9
SHEATH AVANTI 11F 11CM (SHEATH) ×2 IMPLANT
SHEATH PINNACLE 7F 10CM (SHEATH) ×4 IMPLANT
SHEATH SWARTZ TS SL2 63CM 8.5F (SHEATH) ×2 IMPLANT
TUBING SMART ABLATE COOLFLOW (TUBING) ×2 IMPLANT

## 2019-05-07 NOTE — Progress Notes (Signed)
Pt c/o numbness to right hand and left arm. Grips are equal, speech normal, no other complaints.  Dr Rayann Heman called and informed. No new orders states ok to D/c him

## 2019-05-07 NOTE — Transfer of Care (Signed)
Immediate Anesthesia Transfer of Care Note  Patient: Michael Livingston  Procedure(s) Performed: ATRIAL FIBRILLATION ABLATION (N/A )  Patient Location: Cath Lab  Anesthesia Type:General  Level of Consciousness: awake, alert  and oriented  Airway & Oxygen Therapy: Patient Spontanous Breathing  Post-op Assessment: Report given to RN and Post -op Vital signs reviewed and stable  Post vital signs: Reviewed and stable  Last Vitals:  Vitals Value Taken Time  BP 129/64 05/07/19 1334  Temp    Pulse 74 05/07/19 1335  Resp 13 05/07/19 1335  SpO2 100 % 05/07/19 1335  Vitals shown include unvalidated device data.  Last Pain:  Vitals:   05/07/19 0809  TempSrc:   PainSc: 0-No pain         Complications: No apparent anesthesia complications

## 2019-05-07 NOTE — Progress Notes (Signed)
Discharge instructions reviewed with pt and his wife both voice understanding.

## 2019-05-07 NOTE — Anesthesia Procedure Notes (Signed)
Procedure Name: Intubation Date/Time: 05/07/2019 10:03 AM Performed by: Kyung Rudd, CRNA Pre-anesthesia Checklist: Patient identified, Emergency Drugs available, Suction available and Patient being monitored Patient Re-evaluated:Patient Re-evaluated prior to induction Oxygen Delivery Method: Circle system utilized Preoxygenation: Pre-oxygenation with 100% oxygen Induction Type: IV induction Ventilation: Mask ventilation without difficulty Laryngoscope Size: Mac and 4 Grade View: Grade I Tube type: Oral Tube size: 7.5 mm Number of attempts: 1 Airway Equipment and Method: Stylet Placement Confirmation: ETT inserted through vocal cords under direct vision,  positive ETCO2 and breath sounds checked- equal and bilateral Secured at: 22 cm Tube secured with: Tape Dental Injury: Teeth and Oropharynx as per pre-operative assessment

## 2019-05-07 NOTE — Discharge Instructions (Signed)
Take xarelto tonight  Cardiac Ablation, Care After This sheet gives you information about how to care for yourself after your procedure. Your health care provider may also give you more specific instructions. If you have problems or questions, contact your health care provider. What can I expect after the procedure? After the procedure, it is common to have:  Bruising around your puncture site.  Tenderness around your puncture site.  Skipped heartbeats.  Tiredness (fatigue).  Follow these instructions at home: Puncture site care   Follow instructions from your health care provider about how to take care of your puncture site. Make sure you: ? If present, leave stitches (sutures), skin glue, or adhesive strips in place. These skin closures may need to stay in place for up to 2 weeks. If adhesive strip edges start to loosen and curl up, you may trim the loose edges. Do not remove adhesive strips completely unless your health care provider tells you to do that.  Check your puncture site every day for signs of infection. Check for: ? Redness, swelling, or pain. ? Fluid or blood. If your puncture site starts to bleed, lie down on your back, apply firm pressure to the area, and contact your health care provider. ? Warmth. ? Pus or a bad smell. Driving  Do not drive for at least 4 days after your procedure or however long your health care provider recommends.  Do not drive or use heavy machinery while taking prescription pain medicine.  Do not drive for 24 hours if you were given a medicine to help you relax (sedative) during your procedure. Activity  Avoid activities that take a lot of effort for at least 7 days after your procedure.  Do not lift anything that is heavier than 5 lb (4.5 kg) for one week.   No sexual activity for 1 week.   Return to your normal activities as told by your health care provider. Ask your health care provider what activities are safe for you. General  instructions  Take over-the-counter and prescription medicines only as told by your health care provider.  Do not use any products that contain nicotine or tobacco, such as cigarettes and e-cigarettes. If you need help quitting, ask your health care provider.  You may shower after 24 hours, but Do not take baths, swim, or use a hot tub for 1 week.   Do not drink alcohol for 24 hours after your procedure.  Keep all follow-up visits as told by your health care provider. This is important. Contact a health care provider if:  You have redness, mild swelling, or pain around your puncture site.  You have fluid or blood coming from your puncture site that stops after applying firm pressure to the area.  Your puncture site feels warm to the touch.  You have pus or a bad smell coming from your puncture site.  You have a fever.  You have chest pain or discomfort that spreads to your neck, jaw, or arm.  You are sweating a lot.  You feel nauseous.  You have a fast or irregular heartbeat.  You have shortness of breath.  You are dizzy or light-headed and feel the need to lie down.  You have pain or numbness in the arm or leg closest to your puncture site. Get help right away if:  Your puncture site suddenly swells.  Your puncture site is bleeding and the bleeding does not stop after applying firm pressure to the area. These symptoms may represent  a serious problem that is an emergency. Do not wait to see if the symptoms will go away. Get medical help right away. Call your local emergency services (911 in the [place] Do not drive yourself to the hospital. Summary  After the procedure, it is normal to have bruising and tenderness at the puncture site in your groin, neck, or forearm.  Check your puncture site every day for signs of infection.  Get help right away if your puncture site is bleeding and the bleeding does not stop after applying firm pressure to the area. This is a medical  emergency. This information is not intended to replace advice given to you by your health care provider. Make sure you discuss any questions you have with your health care provider.

## 2019-05-07 NOTE — Anesthesia Preprocedure Evaluation (Addendum)
Anesthesia Evaluation  Patient identified by MRN, date of birth, ID band Patient awake    Reviewed: Allergy & Precautions, NPO status , Patient's Chart, lab work & pertinent test results, reviewed documented beta blocker date and time   Airway Mallampati: I  TM Distance: >3 FB Neck ROM: Full    Dental no notable dental hx. (+) Teeth Intact, Dental Advisory Given   Pulmonary neg pulmonary ROS, former smoker,    Pulmonary exam normal breath sounds clear to auscultation       Cardiovascular hypertension, Pt. on home beta blockers and Pt. on medications + CAD, + Past MI and + Cardiac Stents (DES to LAD 2005)  Normal cardiovascular exam+ dysrhythmias (on xarelto) Atrial Fibrillation  Rhythm:Regular Rate:Normal  Cardiac CT 04/2019 1. There is normal pulmonary vein drainage into the left atrium with ostial measurements above. 2. No PFO/ASD 3. Accessory left atrial appendage noted. 4. There is no thrombus in the left atrial appendage. 5. The esophagus runs in the left atrial midline and is not in the proximity to any of the pulmonary veins. 6. The study was performed without use of NTG and is insufficient for plaque evaluation, however only mild, non-obstructive CAD is noted in the proximal LAD, ramus, LCX. Left dominance. 7. Normal left ventricular function, EF 62%  TTE 03/2019 Normal LVEF, no significant valvular abnormalities   Neuro/Psych negative neurological ROS  negative psych ROS   GI/Hepatic Neg liver ROS, GERD  Medicated,  Endo/Other  negative endocrine ROSdiabetes, Well Controlled, Type 2, Oral Hypoglycemic Agents  Renal/GU negative Renal ROS  negative genitourinary   Musculoskeletal negative musculoskeletal ROS (+)   Abdominal   Peds  Hematology negative hematology ROS (+)   Anesthesia Other Findings   Reproductive/Obstetrics                            Anesthesia Physical Anesthesia  Plan  ASA: III  Anesthesia Plan: General   Post-op Pain Management:    Induction: Intravenous  PONV Risk Score and Plan: 2 and Midazolam, Dexamethasone and Ondansetron  Airway Management Planned: Oral ETT  Additional Equipment:   Intra-op Plan:   Post-operative Plan: Extubation in OR  Informed Consent: I have reviewed the patients History and Physical, chart, labs and discussed the procedure including the risks, benefits and alternatives for the proposed anesthesia with the patient or authorized representative who has indicated his/her understanding and acceptance.     Dental advisory given  Plan Discussed with: CRNA  Anesthesia Plan Comments:         Anesthesia Quick Evaluation

## 2019-05-07 NOTE — Interval H&P Note (Signed)
History and Physical Interval Note:  05/07/2019 10:00 AM  Michael Livingston  has presented today for surgery, with the diagnosis of Atrial Fibrillation.  The various methods of treatment have been discussed with the patient and family. After consideration of risks, benefits and other options for treatment, the patient has consented to  Procedure(s): ATRIAL FIBRILLATION ABLATION (N/A) as a surgical intervention.  The patient's history has been reviewed, patient examined, no change in status, stable for surgery.  I have reviewed the patient's chart and labs.  Questions were answered to the patient's satisfaction.    He reports compliance with anticoagulation without interruption. Cardiac CT reviewed  Thompson Grayer

## 2019-05-08 LAB — POCT ACTIVATED CLOTTING TIME: Activated Clotting Time: 257 seconds

## 2019-05-08 NOTE — Anesthesia Postprocedure Evaluation (Signed)
Anesthesia Post Note  Patient: Zakariah Waltman  Procedure(s) Performed: ATRIAL FIBRILLATION ABLATION (N/A )     Patient location during evaluation: PACU Anesthesia Type: General Level of consciousness: awake and alert Pain management: pain level controlled Vital Signs Assessment: post-procedure vital signs reviewed and stable Respiratory status: spontaneous breathing, nonlabored ventilation, respiratory function stable and patient connected to nasal cannula oxygen Cardiovascular status: blood pressure returned to baseline and stable Postop Assessment: no apparent nausea or vomiting Anesthetic complications: no    Last Vitals:  Vitals:   05/07/19 1700 05/07/19 1730  BP: (!) 142/69   Pulse: 94 92  Resp: 14 12  Temp:    SpO2: 98% 100%    Last Pain:  Vitals:   05/07/19 1527  TempSrc:   PainSc: 0-No pain   Pain Goal:                   Lavonia Eager L Jatavius Ellenwood

## 2019-05-13 ENCOUNTER — Telehealth: Payer: Self-pay | Admitting: Family Medicine

## 2019-05-13 NOTE — Telephone Encounter (Signed)
See request °

## 2019-05-13 NOTE — Telephone Encounter (Incomplete)
Medication Refill - Medication: Lisinopril and Viagra generic  Has the patient contacted their pharmacy? No. Preferred Pharmacy (with phone number or street name): [facility]  Agent: Please be advised that RX refills may take up to 3 business days. We ask that you follow-up with your pharmacy.  CB#  [phone]

## 2019-05-14 ENCOUNTER — Other Ambulatory Visit: Payer: Self-pay

## 2019-05-14 MED ORDER — LISINOPRIL 2.5 MG PO TABS: 2.5000 mg | ORAL_TABLET | Freq: Every day | ORAL | 3 refills | Status: DC

## 2019-05-14 MED ORDER — SILDENAFIL CITRATE 100 MG PO TABS: 50.0000 mg | ORAL_TABLET | Freq: Every day | ORAL | 3 refills | Status: DC | PRN

## 2019-05-14 NOTE — Telephone Encounter (Signed)
Both of these medications have been sent for refill to the [facility] mail order pharmacy for the patient.

## 2019-05-15 ENCOUNTER — Ambulatory Visit: Payer: BC Managed Care – PPO | Admitting: Audiology

## 2019-05-24 ENCOUNTER — Ambulatory Visit (HOSPITAL_COMMUNITY): Payer: BC Managed Care – PPO | Admitting: Physician Assistant

## 2019-06-03 ENCOUNTER — Telehealth (HOSPITAL_COMMUNITY): Payer: Self-pay | Admitting: *Deleted

## 2019-06-03 NOTE — Telephone Encounter (Signed)
Patient called in stating he had afib episode last PM which subsided with cardizem use. Having fatigue and coughing went for covid testing today. Will call back if symptoms continue for further guidance.

## 2019-06-04 ENCOUNTER — Ambulatory Visit (HOSPITAL_COMMUNITY): Payer: BC Managed Care – PPO | Admitting: Physician Assistant

## 2019-06-04 ENCOUNTER — Telehealth: Payer: Self-pay | Admitting: Cardiovascular Disease

## 2019-06-04 NOTE — Telephone Encounter (Signed)
Patient c/o Palpitations:  High priority if patient c/o lightheadedness, shortness of breath, or chest pain  1) How long have you had palpitations/irregular HR/ Afib? Are you having the symptoms now?   The past two nights  2) Are you currently experiencing lightheadedness, SOB or CP? No  3) Do you have a history of afib (atrial fibrillation) or irregular heart rhythm? Yes  4) Have you checked your BP or HR? (document readings if available): No   5) Are you experiencing any other symptoms? No   Patient is calling stating he has been experiencing Afib the last two nights. He states it started the night of 06/03/19 but cleared up by the morning. It then occurred again last night and he had a fever of 101 degrees. He states he was tested for Covid yesterday due to having a cough he couldn't get rid of with cough drops but he does not have a cough today and the Afib has stopped again this morning.

## 2019-06-04 NOTE — Telephone Encounter (Signed)
Spoke with patient and he has been having issues with Afib the last couple of nights Patient has been using his Diltiazem prn as advised. He is taking Toprol 25 mg daily and Xarelto 20 mg daily as prescribed. Per patient he has had no issues since his ablation. Has had cough for several days and did get COVID test yesterday, no results yet. Positive for fever over 101 last night. SBP in 130's. Per wife his HR when she feels it will beat fast for several beats, skip and beat slower, then fast again. He did lay around and rest all day yesterday. Will forward to Afib clinic for review, Dr Rayann Heman out of the office

## 2019-06-04 NOTE — Telephone Encounter (Signed)
Left message to call back  

## 2019-06-04 NOTE — Telephone Encounter (Signed)
Talked with patient - per Michael fenton pa he will increase metoprolol to 37.5mg  a day HR is only in the 70s. He can use PRN cardizem for breakthrough if heart rate increases. Pt will call PCP if fevers continue still awaiting covid test results. Basic education on afib post ablation reviewed.

## 2019-06-11 ENCOUNTER — Ambulatory Visit (HOSPITAL_COMMUNITY)
Admission: RE | Admit: 2019-06-11 | Discharge: 2019-06-11 | Disposition: A | Discharge status: Home / Self Care | Payer: BC Managed Care – PPO | Source: Ambulatory Visit | Attending: Physician Assistant | Admitting: Physician Assistant

## 2019-06-11 ENCOUNTER — Ambulatory Visit: Payer: BC Managed Care – PPO | Attending: Family Medicine | Admitting: Audiology

## 2019-06-11 ENCOUNTER — Other Ambulatory Visit: Payer: Self-pay

## 2019-06-11 VITALS — BP 126/74 | HR 79 | Ht 74.0 in | Wt 202.8 lb

## 2019-06-11 DIAGNOSIS — I2510 Atherosclerotic heart disease of native coronary artery without angina pectoris: Secondary | ICD-10-CM | POA: Insufficient documentation

## 2019-06-11 DIAGNOSIS — Z87891 Personal history of nicotine dependence: Secondary | ICD-10-CM | POA: Insufficient documentation

## 2019-06-11 DIAGNOSIS — E1122 Type 2 diabetes mellitus with diabetic chronic kidney disease: Secondary | ICD-10-CM | POA: Diagnosis not present

## 2019-06-11 DIAGNOSIS — Z79899 Other long term (current) drug therapy: Secondary | ICD-10-CM | POA: Diagnosis not present

## 2019-06-11 DIAGNOSIS — Z7901 Long term (current) use of anticoagulants: Secondary | ICD-10-CM | POA: Insufficient documentation

## 2019-06-11 DIAGNOSIS — H93299 Other abnormal auditory perceptions, unspecified ear: Secondary | ICD-10-CM

## 2019-06-11 DIAGNOSIS — N189 Chronic kidney disease, unspecified: Secondary | ICD-10-CM | POA: Insufficient documentation

## 2019-06-11 DIAGNOSIS — H9313 Tinnitus, bilateral: Secondary | ICD-10-CM

## 2019-06-11 DIAGNOSIS — I4819 Other persistent atrial fibrillation: Secondary | ICD-10-CM | POA: Diagnosis present

## 2019-06-11 DIAGNOSIS — D6869 Other thrombophilia: Secondary | ICD-10-CM | POA: Insufficient documentation

## 2019-06-11 DIAGNOSIS — E785 Hyperlipidemia, unspecified: Secondary | ICD-10-CM | POA: Diagnosis not present

## 2019-06-11 DIAGNOSIS — Z7984 Long term (current) use of oral hypoglycemic drugs: Secondary | ICD-10-CM | POA: Insufficient documentation

## 2019-06-11 DIAGNOSIS — I129 Hypertensive chronic kidney disease with stage 1 through stage 4 chronic kidney disease, or unspecified chronic kidney disease: Secondary | ICD-10-CM | POA: Insufficient documentation

## 2019-06-11 DIAGNOSIS — I252 Old myocardial infarction: Secondary | ICD-10-CM | POA: Insufficient documentation

## 2019-06-11 DIAGNOSIS — H906 Mixed conductive and sensorineural hearing loss, bilateral: Secondary | ICD-10-CM | POA: Diagnosis present

## 2019-06-11 DIAGNOSIS — R292 Abnormal reflex: Secondary | ICD-10-CM | POA: Diagnosis present

## 2019-06-11 NOTE — Procedures (Signed)
[facility]  [place], [place] [address]  [phone]   Audiological Evaluation  Patient Name: Michael Livingston   Status: Outpatient   DOB: [birth year]    Diagnosis: Tinnitus  MRN: [id number] Date:  06/11/2019     Referent: Eulas Post, MD  History: Michael Livingston was seen for an audiological evaluation.  Primary Concern: Bilateral tinnitus, high pitched, started about "two months" . Around this time Michael Livingston reports that he injured the right side of his face, above and below the eye with some boards sticking out the back of his truck.  He had severe headache, sinus and jaw pain that lasted for weeks and he continues to have discomfort.  He had a "scan that didn't show anything".  Significant is that Michael Livingston has cardiac issues- had a cardiac ablation in December, but continues to have some "atrial fibrillation" and be monitored. He also has "diabetes".  Pain: None History of hearing problems: Not significant to Michael Livingston but "wife says I can't hear" History of dizziness/vertigo:  Not from recent face injury, but has "diabetes" and "atrial fibrillation" and sometimes feel unsteady.  Sound sensitivity: N Family history of hearing loss: Michael Livingston's father reports having tinnitus prior to his hearing loss developing.    Evaluation: Conventional pure tone audiometry from 250Hz  - 8000Hz  with using insert earphones.  Hearing Thresholds: Right ear:  Thresholds of 10-15 dBHL from 250Hz  - 1500Hz ; 20 dBHL at 2000Hz ; 30 dBHL from 3000Hz  - 4000Hz ; 25 dBHL at 6000Hz  and 40 dBHL at 8000Hz . The hearing loss appears mixed.  Left ear:    Thresholds of 10-15 dBHL from 250Hz  - 500Hz ; 20 dBHL at 1000Hz ; 30-40 dBHL from 1500Hz  - 3000Hz ; 45-50 dBHL from 4000Hz - 6000Hz  and 40 dBHL at 8000Hz . The hearing loss appears mixed.  Reliability is good Speech reception levels (repeating words near threshold) using recorded spondee word lists:  Right  ear: 15 dBHL.  Left ear:  20 dBHL Word recognition (at comfortably loud volumes) using recorded NU-6 word lists at 55 dBHL (Most Comfortable Loudness), in quiet.  Right ear: 90%.  Left ear:   90% Word recognition using PBK words in multitaker noise (in minimal background noise):  +5 dBHL  Right ear: 64%                              Left ear:  80%  Tympanometry shows normal middle ear volume, pressure and compliance (Type A). Ipsilateral and Contralateral acoustic reflexes were completed.  Right ear: Ipsilateral acoustic reflexes 90dB from 500Hz  - 2000Hz  and no response at 4000Hz .  Contralateral acoustic reflex abnormal ranging from 110 dB from 500Hz  - 2000Hz  and no response at 4000Hz .  Left ear: Ipsilateral and contralateral acoustic reflexes present  At 85-90 dB except for left contralateral at 4000Hz  which is 100dB.   Tinnitus matching appeared to be approximately 4000-6000Hz  using pure tone at 24 at  dBHL.  The tinnitus does not appeared to suppress.  CONCLUSION:     Michael Livingston some abnormal finding that require further evaluation by an Ear, Nose and Throat physician: 1) left asymmetrical mixed hearing loss 2) Abnormal right contralateral acoustic reflexes 3) poor word recognition on the right side in minimal background noise 4) Bilateral tinnitus that started about 2 months ago. Retrocochlear issues cannot be ruled out at this time.  The right side has normal low frequency sloping to a  mild high frequency with a mixed component. Left sided ipsilateral and contralateral acoustic reflexes are within normal limits.    This amount of hearing loss would adversely affect speech communication at normal conversational speech levels. Word recognition in borderline excellent at conversational speech levels.  In minimal background noise word recognition drops to poor on the right side and good on the left side.   The test results were discussed and Michael Livingston  counseled.   RECOMMENDATIONS: 1.   Refer to Ear, Nose and Throat Physician because of a) left sided asymmetrical hearing loss b) abnormal right contralateral acoustic reflexes c) poorer word recognition on the right side in background noise and d) recent onset of bilateral tinnitus.   2. To minimize the adverse effects of tinnitus 1) avoid quiet  2) use noise maskers at home such as a sound machine, quiet music, a fan or other background noise at a volume just loud enough to mask the high pitched tinnitus. The app Michael Livingston was discussed and was encouraged 3) If the tinnitus becomes more bothersome, adversely affecting your sleep or concentration, contact your physician,  seek additional medical help by an ENT for further treatment of your tinnitus. 4.  Strategies that help improve hearing include: A) Face the speaker directly. Optimal is having the speakers face well - lit.  Unless amplified, being within 3-6 feet of the speaker will enhance word recognition. B) Avoid having the speaker back-lit as this will minimize the ability to use cues from lip-reading, facial expression and gestures. C)  Word recognition is poorer in background noise. For optimal word recognition, turn off the TV, radio or noisy fan when engaging in conversation. In a restaurant, try to sit away from noise sources and close to the primary speaker.  D)  Ask for topic clarification from time to time in order to remain in the conversation.  Most people don't mind repeating or clarifying a point when asked.  If needed, explain the difficulty hearing in background noise or hearing loss. 5.   To protect hearing use hearing protection during noisy activities such as using a weed eater, moving the lawn, shooting, etc.    Musician's plugs, are available from [employer] for music related hearing protection because there is no distortion.  Other hearing protection, such as sponge plugs (available at pharmacies) or earmuffs (available at  sporting goods stores or department stores such as Paediatric nurse) are useful for noisy activities and venues.  Michael Livingston L. Michael Livingston, Au.D., CCC-A Doctor of Audiology 06/11/2019  cc: Eulas Post, MD

## 2019-06-11 NOTE — Progress Notes (Signed)
Primary Care Physician: Eulas Post, MD Primary Cardiologist: Dr Claiborne Billings Primary Electrophysiologist: Dr Rayann Heman Referring Physician: Dr Francena Hanly Michael Livingston is a 59 y.o. male with a history of CAD, CKD, DM, HLD, HTN, and persistent atrial fibrillation who presents for follow up in the [facility].  The patient was initially diagnosed with atrial fibrillation in 2018 and has required several cardioversions.Patient is on Xarelto for a CHADS2VASC score of 3. Patient is s/p afib ablation with Dr Rayann Heman on 05/07/19. Patient reports that he had some pleuritic chest pain 2-3 days post procedure which has resolved. No swallowing or groin issues. Patient does report that last week he had some heart racing symptoms which improved with PRN CCB and an increased dose of BB. Of note, he did have a fever and a cough at the time and was tested for COVID which was negative.   Today, he denies symptoms of chest pain, shortness of breath, orthopnea, PND, lower extremity edema, dizziness, presyncope, syncope, snoring, daytime somnolence, bleeding, or neurologic sequela. The patient is tolerating medications without difficulties and is otherwise without complaint today.    Atrial Fibrillation Risk Factors:  he does not have symptoms or diagnosis of sleep apnea. he does not have a history of rheumatic fever.   he has a BMI of Body mass index is 26.04 kg/m.Marland Kitchen Filed Weights   06/11/19 1545  Weight: 92 kg    Family History  Problem Relation Age of Onset  . Diabetes Mother   . Heart disease Father   . Diabetes Father   . Heart disease Paternal Grandfather   . Colon cancer Neg Hx   . Esophageal cancer Neg Hx   . Stomach cancer Neg Hx   . Rectal cancer Neg Hx      Atrial Fibrillation Management history:  Previous antiarrhythmic drugs: none Previous cardioversions: several, most recently 03/08/19 Previous ablations: 05/07/19 CHADS2VASC score: 3 Anticoagulation  history: Xarelto   Past Medical History:  Diagnosis Date  . Allergy   . Atrial fibrillation ([place])    a. diagnosed in 01/2017 --> s/p DCCV. Started on Xarelto for anticoagulation.   Marland Kitchen CAD (coronary artery disease)    a. s/p DES to LAD in 2005 b. patent by cath in 2007 with 30% LCx stenosis noted at that time c. normal NST in 05/2011  . Chronic kidney disease    1 kidney stone  . Diabetes mellitus without complication (HCC)    type 2, no meds needed  . Hyperlipidemia   . Hypertension   . Myocardial infarction [facility])    Past Surgical History:  Procedure Laterality Date  . ATRIAL FIBRILLATION ABLATION N/A 05/07/2019   Procedure: ATRIAL FIBRILLATION ABLATION;  Surgeon: Thompson Grayer, MD;  Location: [facility];  Service: Cardiovascular;  Laterality: N/A;  . CARDIAC SURGERY  2005   stent  . CORONARY STENT PLACEMENT    . KNEE ARTHROSCOPY     left 2003, right 2005  . LEFT HEART CATH AND CORONARY ANGIOGRAPHY N/A 12/15/2017   Procedure: LEFT HEART CATH AND CORONARY ANGIOGRAPHY;  Surgeon: Troy Sine, MD;  Location: [facility];  Service: Cardiovascular;  Laterality: N/A;  . SHOULDER SURGERY      Current Outpatient Medications  Medication Sig Dispense Refill  . diltiazem (CARDIZEM) 30 MG tablet Take 1 tablet (30 mg total) by mouth daily as needed. (Patient taking differently: Take 30 mg by mouth daily as needed (afib). ) 90 tablet 3  .  ezetimibe-simvastatin (VYTORIN) 10-20 MG tablet Take 1 tablet by mouth daily. 90 tablet 1  . famotidine (PEPCID) 20 MG tablet Take 1 tablet (20 mg total) by mouth 2 (two) times daily. (Patient taking differently: Take 20 mg by mouth daily. ) 90 tablet 3  . lisinopril (ZESTRIL) 2.5 MG tablet Take 1 tablet (2.5 mg total) by mouth daily. 90 tablet 3  . metFORMIN (GLUCOPHAGE) 500 MG tablet TAKE 1 TABLET TWICE DAILY  WITH MEALS 180 tablet 0  . metoprolol succinate (TOPROL XL) 25 MG 24 hr tablet Take 1.5 tablets (37.5 mg total) by mouth daily.  (Patient taking differently: Take 25 mg by mouth daily. ) 270 tablet 3  . Multiple Vitamins-Minerals (MULTIVITAMIN WITH MINERALS) tablet Take 1 tablet by mouth at bedtime.     . nitroGLYCERIN (NITROSTAT) 0.4 MG SL tablet Place 1 tablet (0.4 mg total) under the tongue every 5 (five) minutes as needed for chest pain. 25 tablet 2  . pantoprazole (PROTONIX) 40 MG tablet Take 1 tablet (40 mg total) by mouth daily. Take 30-60 min before first meal of the day 90 tablet 3  . PAZEO 0.7 % SOLN Place 1 drop into both eyes daily as needed (allergies).   4  . sildenafil (VIAGRA) 100 MG tablet Take 0.5-1 tablets (50-100 mg total) by mouth daily as needed for erectile dysfunction. 15 tablet 3  . XARELTO 20 MG TABS tablet TAKE 1 TABLET DAILY WITH   SUPPER (Patient taking differently: Take 20 mg by mouth daily. ) 90 tablet 1   No current facility-administered medications for this encounter.    Allergies  Allergen Reactions  . Aleve [Naproxen Sodium] Swelling and Other (See Comments)    Mouth swells  . Oxycodone Other (See Comments)    Rendered the patient unable to fall asleep AND he "almost passed out"  . Sulfa Antibiotics Hives  . Trazodone And Nefazodone Other (See Comments)    Seeing colored lights  . Vicodin [Hydrocodone-Acetaminophen] Other (See Comments)    Rendered the patient unable to fall asleep AND he "almost passed out"    Social History   Socioeconomic History  . Marital status: Married    Spouse name: Not on file  . Number of children: Not on file  . Years of education: Not on file  . Highest education level: Not on file  Occupational History  . Not on file  Tobacco Use  . Smoking status: Former Smoker    Packs/day: 1.00    Years: 8.00    Pack years: 8.00    Quit date: 07/17/1987    Years since quitting: 31.9  . Smokeless tobacco: Never Used  Substance and Sexual Activity  . Alcohol use: No    Alcohol/week: 0.0 standard drinks  . Drug use: No  . Sexual activity: Not on file   Other Topics Concern  . Not on file  Social History Narrative  . Not on file   Social Determinants of Health   Financial Resource Strain:   . Difficulty of Paying Living Expenses: Not on file  Food Insecurity:   . Worried About Charity fundraiser in the Last Year: Not on file  . Ran Out of Food in the Last Year: Not on file  Transportation Needs:   . Lack of Transportation (Medical): Not on file  . Lack of Transportation (Non-Medical): Not on file  Physical Activity:   . Days of Exercise per Week: Not on file  . Minutes of Exercise per Session: Not  on file  Stress:   . Feeling of Stress : Not on file  Social Connections:   . Frequency of Communication with Friends and Family: Not on file  . Frequency of Social Gatherings with Friends and Family: Not on file  . Attends Religious Services: Not on file  . Active Member of Clubs or Organizations: Not on file  . Attends Archivist Meetings: Not on file  . Marital Status: Not on file  Intimate Partner Violence:   . Fear of Current or Ex-Partner: Not on file  . Emotionally Abused: Not on file  . Physically Abused: Not on file  . Sexually Abused: Not on file     ROS- All systems are reviewed and negative except as per the HPI above.  Physical Exam: Vitals:   06/11/19 1545  BP: 126/74  Pulse: 79  Weight: 92 kg  Height: 6\' 2"  (1.88 m)    GEN- The patient is well appearing, alert and oriented x 3 today.   Head- normocephalic, atraumatic Eyes-  Sclera clear, conjunctiva pink Ears- hearing intact Oropharynx- clear Neck- supple  Lungs- Clear to ausculation bilaterally, normal work of breathing Heart- Regular rate and rhythm, no murmurs, rubs or gallops  GI- soft, NT, ND, + BS Extremities- no clubbing, cyanosis, or edema MS- no significant deformity or atrophy Skin- no rash or lesion Psych- euthymic mood, full affect Neuro- strength and sensation are intact  Wt Readings from Last 3 Encounters:  06/11/19 92  kg  05/07/19 90.7 kg  04/25/19 90.7 kg    EKG today demonstrates SR HR 79, inc RBBB, PR 150, QRS 94, QTc 444  Echo 04/01/19 demonstrated  1. Left ventricular ejection fraction, by visual estimation, is 60 to  65%. The left ventricle has normal function. There is no left ventricular  hypertrophy.  2. Global right ventricle has normal systolic function.The right  ventricular size is normal. No increase in right ventricular wall  thickness.  3. Left atrial size was normal.  4. Right atrial size was normal.  5. Trivial pericardial effusion is present.  6. The mitral valve is normal in structure. Trace mitral valve  regurgitation. No evidence of mitral stenosis.  7. The tricuspid valve is normal in structure. Tricuspid valve  regurgitation is not demonstrated.  8. The aortic valve is normal in structure. Aortic valve regurgitation is  not visualized. No evidence of aortic valve sclerosis or stenosis.  9. The pulmonic valve was normal in structure. Pulmonic valve  regurgitation is trivial.  10. TR signal is inadequate for assessing pulmonary artery systolic  pressure.  11. The inferior vena cava is normal in size with <50% respiratory  variability, suggesting right atrial pressure of 8 mmHg.   Epic records are reviewed at length today  CHA2DS2-VASc Score = 3 The patient's score is based upon: CHF History: No HTN History: Yes Age : < 65 Diabetes History: Yes Stroke History: No Vascular Disease History: Yes Gender: Male      ASSESSMENT AND PLAN: Persistent Atrial Fibrillation (ICD10:  I48.19) The patient's CHA2DS2-VASc score is 3, indicating a 3.2% annual risk of stroke.   S/p afib ablation with Dr Rayann Heman 05/07/19. Continue Xarelto 20 mg daily for at least 3 months post ablation with no missed doses. Continue Toprol 25 mg daily. May take an extra 1/2 pill if PRN diltiazem is not effective. Continue diltiazem 30 mg q4hrs PRN for heart racing.  Secondary  Hypercoagulable State (ICD10:  D68.69) The patient is at significant risk for  stroke/thromboembolism based upon his CHA2DS2-VASc Score of 3.  Continue Rivaroxaban (Xarelto).   3. CAD No anginal symptoms. Followed by Dr Claiborne Billings.  4. HTN Stable, no changes today.   Follow up with Dr Rayann Heman as scheduled and Dr Claiborne Billings per recall.    [facility] [address] [place], [place] [address] [phone] 06/11/2019 4:09 PM

## 2019-06-12 MED ORDER — METOPROLOL SUCCINATE ER 25 MG PO TB24: 25.0000 mg | ORAL_TABLET | Freq: Every day | ORAL | Status: DC

## 2019-06-12 NOTE — Addendum Note (Signed)
Encounter addended by: Enid Derry, CMA on: 06/12/2019 2:52 PM  Actions taken: Order list changed

## 2019-06-18 ENCOUNTER — Telehealth: Payer: Self-pay | Admitting: Family Medicine

## 2019-06-18 NOTE — Telephone Encounter (Signed)
Audiologist is going to fax over there notes on pt and request a referral to ENT doc.  Pt called to verify that he has receive their fax and if there will be a referral made.   Pt can be reached at [phone]

## 2019-06-19 ENCOUNTER — Other Ambulatory Visit: Payer: Self-pay

## 2019-06-19 DIAGNOSIS — H9313 Tinnitus, bilateral: Secondary | ICD-10-CM

## 2019-06-19 NOTE — Telephone Encounter (Signed)
I have not seen the fax from the Audiologist. Please advise.

## 2019-06-19 NOTE — Telephone Encounter (Signed)
Called patient and let him know that I have placed the ENT referral and let him know that we did receive his paperwork from audiology doctor. Patient verbalized an understanding.

## 2019-06-19 NOTE — Telephone Encounter (Signed)
We do have some notes from audiologist from early February.  These are in the media section.  Okay to set up ENT referral if that is what they are recommending

## 2019-07-03 ENCOUNTER — Ambulatory Visit (INDEPENDENT_AMBULATORY_CARE_PROVIDER_SITE_OTHER): Payer: BC Managed Care – PPO | Admitting: Otolaryngology

## 2019-07-03 ENCOUNTER — Other Ambulatory Visit: Payer: Self-pay

## 2019-07-03 ENCOUNTER — Encounter (INDEPENDENT_AMBULATORY_CARE_PROVIDER_SITE_OTHER): Payer: Self-pay | Admitting: Otolaryngology

## 2019-07-03 VITALS — Temp 97.7°F

## 2019-07-03 DIAGNOSIS — H9313 Tinnitus, bilateral: Secondary | ICD-10-CM

## 2019-07-03 DIAGNOSIS — H903 Sensorineural hearing loss, bilateral: Secondary | ICD-10-CM | POA: Diagnosis not present

## 2019-07-03 NOTE — Progress Notes (Signed)
HPI: Michael Livingston is a 59 y.o. male who presents for evaluation of recent hearing evaluation and complaints of chronic tinnitus.  He had apparently developed trauma to his head from some boards in his truck that occurred in November of last year.  Since that time he has had ringing in his ears for the past 3 months.  Sometimes is worse than other times.  He had a CT scan of his head following the trauma to his head and this was normal.  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  He has essentially normal hearing in the mid to lower frequencies with mixed hearing loss in the upper frequencies above 2000.  Left side low bit worse than right side.  However his SRT's were approximately 20 dB.  He had bilateral type A tympanograms. He has worked around noise for a number of years. When he is under stress he feels like the tinnitus gets louder.  He does not really notice it while he is working but when things are quiet he notices it more intensely.  Past Medical History:  Diagnosis Date  . Allergy   . Atrial fibrillation ([place])    a. diagnosed in 01/2017 --> s/p DCCV. Started on Xarelto for anticoagulation.   Marland Kitchen CAD (coronary artery disease)    a. s/p DES to LAD in 2005 b. patent by cath in 2007 with 30% LCx stenosis noted at that time c. normal NST in 05/2011  . Chronic kidney disease    1 kidney stone  . Diabetes mellitus without complication (HCC)    type 2, no meds needed  . Hyperlipidemia   . Hypertension   . Myocardial infarction [facility])    Past Surgical History:  Procedure Laterality Date  . ATRIAL FIBRILLATION ABLATION N/A 05/07/2019   Procedure: ATRIAL FIBRILLATION ABLATION;  Surgeon: Thompson Grayer, MD;  Location: [place];  Service:  Cardiovascular;  Laterality: N/A;  . CARDIAC SURGERY  2005   stent  . CORONARY STENT PLACEMENT    . KNEE ARTHROSCOPY     left 2003, right 2005  . LEFT HEART CATH AND CORONARY ANGIOGRAPHY N/A 12/15/2017   Procedure: LEFT HEART CATH AND CORONARY ANGIOGRAPHY;  Surgeon: Troy Sine, MD;  Location: [facility];  Service: Cardiovascular;  Laterality: N/A;  . SHOULDER SURGERY     Social History   Socioeconomic History  . Marital status: Married    Spouse name: Not on file  . Number of children: Not on file  . Years of education: Not on file  . Highest education level: Not on file  Occupational History  . Not on file  Tobacco Use  . Smoking status: Former Smoker    Packs/day: 1.00    Years: 8.00    Pack years: 8.00    Quit date: 07/17/1987    Years since quitting: 31.9  . Smokeless tobacco: Never Used  Substance and Sexual Activity  . Alcohol use: No    Alcohol/week: 0.0 standard drinks  . Drug use: No  . Sexual activity: Not on file  Other Topics Concern  . Not on file  Social History Narrative  . Not on file   Social Determinants of Health   Financial Resource Strain:   . Difficulty of Paying Living Expenses:  Not on file  Food Insecurity:   . Worried About Charity fundraiser in the Last Year: Not on file  . Ran Out of Food in the Last Year: Not on file  Transportation Needs:   . Lack of Transportation (Medical): Not on file  . Lack of Transportation (Non-Medical): Not on file  Physical Activity:   . Days of Exercise per Week: Not on file  . Minutes of Exercise per Session: Not on file  Stress:   . Feeling of Stress : Not on file  Social Connections:   . Frequency of Communication with Friends and Family: Not on file  . Frequency of Social Gatherings with Friends and Family: Not on file  . Attends Religious Services: Not on file  . Active Member of Clubs or Organizations: Not on file  . Attends Archivist Meetings: Not on file  . Marital Status:  Not on file   Family History  Problem Relation Age of Onset  . Diabetes Mother   . Heart disease Father   . Diabetes Father   . Heart disease Paternal Grandfather   . Colon cancer Neg Hx   . Esophageal cancer Neg Hx   . Stomach cancer Neg Hx   . Rectal cancer Neg Hx    Allergies  Allergen Reactions  . Aleve [Naproxen Sodium] Swelling and Other (See Comments)    Mouth swells  . Oxycodone Other (See Comments)    Rendered the patient unable to fall asleep AND he "almost passed out"  . Sulfa Antibiotics Hives  . Trazodone And Nefazodone Other (See Comments)    Seeing colored lights  . Vicodin [Hydrocodone-Acetaminophen] Other (See Comments)    Rendered the patient unable to fall asleep AND he "almost passed out"   Prior to Admission medications   Medication Sig Start Date End Date Taking? Authorizing Provider  diltiazem (CARDIZEM) 30 MG tablet Take 1 tablet (30 mg total) by mouth daily as needed. Patient taking differently: Take 30 mg by mouth daily as needed (afib).  08/13/18 08/13/19 Yes Burchette, Alinda Sierras, MD  ezetimibe-simvastatin (VYTORIN) 10-20 MG tablet Take 1 tablet by mouth daily. 11/22/18  Yes Troy Sine, MD  famotidine (PEPCID) 20 MG tablet Take 1 tablet (20 mg total) by mouth 2 (two) times daily. Patient taking differently: Take 20 mg by mouth daily.  10/12/18  Yes Burchette, Alinda Sierras, MD  lisinopril (ZESTRIL) 2.5 MG tablet Take 1 tablet (2.5 mg total) by mouth daily. 05/14/19  Yes Burchette, Alinda Sierras, MD  metFORMIN (GLUCOPHAGE) 500 MG tablet TAKE 1 TABLET TWICE DAILY  WITH MEALS 05/06/19  Yes Burchette, Alinda Sierras, MD  metoprolol succinate (TOPROL XL) 25 MG 24 hr tablet Take 1 tablet (25 mg total) by mouth daily. 06/12/19  Yes [place]enton, Clint R, PA  Multiple Vitamins-Minerals (MULTIVITAMIN WITH MINERALS) tablet Take 1 tablet by mouth at bedtime.    Yes [provider]  nitroGLYCERIN (NITROSTAT) 0.4 MG SL tablet Place 1 tablet (0.4 mg total) under the tongue every 5 (five)  minutes as needed for chest pain. 01/02/18  Yes Duke, Tami Lin, PA  pantoprazole (PROTONIX) 40 MG tablet Take 1 tablet (40 mg total) by mouth daily. Take 30-60 min before first meal of the day 08/13/18  Yes Burchette, Alinda Sierras, MD  PAZEO 0.7 % SOLN Place 1 drop into both eyes daily as needed (allergies).  10/03/15  Yes [provider]  sildenafil (VIAGRA) 100 MG tablet Take 0.5-1 tablets (50-100 mg total) by  mouth daily as needed for erectile dysfunction. 05/14/19  Yes Burchette, Alinda Sierras, MD  XARELTO 20 MG TABS tablet TAKE 1 TABLET DAILY WITH   SUPPER Patient taking differently: Take 20 mg by mouth daily.  03/27/19  Yes Burchette, Alinda Sierras, MD     Positive ROS: Otherwise negative  All other systems have been reviewed and were otherwise negative with the exception of those mentioned in the HPI and as above.  Physical Exam: Constitutional: Alert, well-appearing, no acute distress Ears: External ears without lesions or tenderness.  He had minimal wax buildup in his ears that was cleaned with forceps.  TMs were clear bilaterally with good mobility on pneumatic otoscopy.  Hearing screening with a 512 and 1024 tuning fork revealed good hearing in both ears with the 1024 tuning fork.  AC was greater than BC bilaterally with a 512 tuning fork. Nasal: External nose without lesions. Septum with minimal deformity.. Clear nasal passages.  No signs of infection. Oral: Lips and gums without lesions. Tongue and palate mucosa without lesions. Posterior oropharynx clear. Neck: No palpable adenopathy or masses Respiratory: Breathing comfortably  Skin: No facial/neck lesions or rash noted.  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  I discussed with him concerning using masking noise when the tinnitus is bad.  Also discussed with him concerning using  ear protection when around loud noise.  The audiologist had previously reviewed this with him. On review of the CT scan there is no gross evidence of retrocochlear pathology.  Suggested repeating audiogram in 1 year to see if he has any progression of his hearing loss and consider MRI scan if hearing loss progresses.  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 Also gave him samples of Lipo flavonoid to try as this is beneficial in some people.  Radene Journey, MD

## 2019-07-06 ENCOUNTER — Other Ambulatory Visit: Payer: Self-pay | Admitting: Family Medicine

## 2019-07-08 NOTE — Telephone Encounter (Signed)
This medication is over-the-counter but if he needs a refill can send in for 1 year

## 2019-07-18 ENCOUNTER — Other Ambulatory Visit: Payer: Self-pay | Admitting: Family Medicine

## 2019-07-22 ENCOUNTER — Ambulatory Visit: Payer: BC Managed Care – PPO | Admitting: Internal Medicine

## 2019-07-31 ENCOUNTER — Telehealth: Payer: Self-pay | Admitting: Cardiovascular Disease

## 2019-07-31 MED ORDER — METOPROLOL SUCCINATE ER 25 MG PO TB24: 25.0000 mg | ORAL_TABLET | Freq: Every day | ORAL | 2 refills | Status: DC

## 2019-07-31 NOTE — Telephone Encounter (Signed)
*  STAT* If patient is at the pharmacy, call can be transferred to refill team.   1. Which medications need to be refilled? (please list name of each medication and dose if known) metoprolol succinate (TOPROL XL) 25 MG 24 hr tablet  2. Which pharmacy/location (including street and city if local pharmacy) is medication to be sent to? [id number] [place], [place] - [address]  3. Do they need a 30 day or 90 day supply? 90  Please check to make sure none of his other medications are about to run out. He states [facility] system is down and they are requesting him to get his refills through his pharmacy in [place].

## 2019-07-31 NOTE — Telephone Encounter (Signed)
Wrong pool, re-routed to Refills.

## 2019-08-06 ENCOUNTER — Other Ambulatory Visit: Payer: Self-pay | Admitting: Family Medicine

## 2019-08-06 NOTE — Telephone Encounter (Signed)
Lvm for pt to call back in order to use coupon he would need to call pharmacy. Need to know if okay to send in

## 2019-08-06 NOTE — Telephone Encounter (Signed)
Medication:Sildenafil  Pharmacy: [facility]    Pt is requesting a call back before filling, he is wanting to use a coupon he found online.

## 2019-08-08 MED ORDER — SILDENAFIL CITRATE 100 MG PO TABS: 50.0000 mg | ORAL_TABLET | Freq: Every day | ORAL | 3 refills | Status: DC | PRN

## 2019-08-08 NOTE — Telephone Encounter (Signed)
An Rx request has been received and this prescription has been sent to the pharmacy [facility] per patient request.

## 2019-08-08 NOTE — Telephone Encounter (Signed)
[facility] , [address], [place], [place] [address]

## 2019-08-12 ENCOUNTER — Other Ambulatory Visit: Payer: Self-pay

## 2019-08-12 ENCOUNTER — Encounter: Payer: Self-pay | Admitting: Internal Medicine

## 2019-08-12 ENCOUNTER — Telehealth (INDEPENDENT_AMBULATORY_CARE_PROVIDER_SITE_OTHER): Payer: BC Managed Care – PPO | Admitting: Internal Medicine

## 2019-08-12 VITALS — Ht 74.0 in | Wt 200.0 lb

## 2019-08-12 DIAGNOSIS — I10 Essential (primary) hypertension: Secondary | ICD-10-CM

## 2019-08-12 DIAGNOSIS — D6869 Other thrombophilia: Secondary | ICD-10-CM

## 2019-08-12 DIAGNOSIS — I4819 Other persistent atrial fibrillation: Secondary | ICD-10-CM | POA: Diagnosis not present

## 2019-08-12 NOTE — Progress Notes (Signed)
Electrophysiology TeleHealth Note   Due to national recommendations of social distancing due to COVID 19, an audio/video telehealth visit is felt to be most appropriate for this patient at this time.  See MyChart message from today for the patient's consent to telehealth for [facility].  Date:  08/12/2019   ID:  Georgina Pillion, DOB 1960/09/18, MRN [id number]  Location: patient's home  Provider location:  [facility]  Evaluation Performed: Follow-up visit  PCP:  Eulas Post, MD   Electrophysiologist:  Dr Rayann Heman  Chief Complaint:  palpitations  History of Present Illness:    Detavious Bainbridge is a 59 y.o. male who presents via telehealth conferencing today.  Since his ablation, the patient reports doing very well. afib is well controlled.  Denies procedure related complications.  Today, he denies symptoms of palpitations, chest pain, shortness of breath,  lower extremity edema, dizziness, presyncope, or syncope.  The patient is otherwise without complaint today.  The patient denies symptoms of fevers, chills, cough, or new SOB worrisome for COVID 19.  Past Medical History:  Diagnosis Date  . Allergy   . Atrial fibrillation ([place])    a. diagnosed in 01/2017 --> s/p DCCV. Started on Xarelto for anticoagulation.   Marland Kitchen CAD (coronary artery disease)    a. s/p DES to LAD in 2005 b. patent by cath in 2007 with 30% LCx stenosis noted at that time c. normal NST in 05/2011  . Chronic kidney disease    1 kidney stone  . Diabetes mellitus without complication (HCC)    type 2, no meds needed  . Hyperlipidemia   . Hypertension   . Myocardial infarction [facility])     Past Surgical History:  Procedure Laterality Date  . ATRIAL FIBRILLATION ABLATION N/A 05/07/2019   Procedure: ATRIAL FIBRILLATION ABLATION;  Surgeon: Thompson Grayer, MD;  Location: [place] [facility];  Service: Cardiovascular;  Laterality: N/A;  . CARDIAC SURGERY  2005   stent  . CORONARY STENT PLACEMENT    . KNEE  ARTHROSCOPY     left 2003, right 2005  . LEFT HEART CATH AND CORONARY ANGIOGRAPHY N/A 12/15/2017   Procedure: LEFT HEART CATH AND CORONARY ANGIOGRAPHY;  Surgeon: Troy Sine, MD;  Location: [place] [facility];  Service: Cardiovascular;  Laterality: N/A;  . SHOULDER SURGERY      Current Outpatient Medications  Medication Sig Dispense Refill  . diltiazem (CARDIZEM) 30 MG tablet Take 1 tablet (30 mg total) by mouth daily as needed. 90 tablet 3  . ezetimibe-simvastatin (VYTORIN) 10-20 MG tablet Take 1 tablet by mouth daily. 90 tablet 1  . famotidine (PEPCID) 20 MG tablet Take 1 tablet (20 mg total) by mouth daily. 90 tablet 3  . lisinopril (ZESTRIL) 2.5 MG tablet Take 1 tablet (2.5 mg total) by mouth daily. 90 tablet 3  . metFORMIN (GLUCOPHAGE) 500 MG tablet Take 1 tablet (500 mg total) by mouth 2 (two) times daily with a meal. Please schedule follow up for further refills. [phone] 180 tablet 0  . metoprolol succinate (TOPROL XL) 25 MG 24 hr tablet Take 1 tablet (25 mg total) by mouth daily. 90 tablet 2  . Multiple Vitamins-Minerals (MULTIVITAMIN WITH MINERALS) tablet Take 1 tablet by mouth at bedtime.     . nitroGLYCERIN (NITROSTAT) 0.4 MG SL tablet Place 1 tablet (0.4 mg total) under the tongue every 5 (five) minutes as needed for chest pain. 25 tablet 2  . pantoprazole (PROTONIX) 40 MG tablet Take 1  tablet (40 mg total) by mouth daily. Take 30-60 min before first meal of the day 90 tablet 3  . PAZEO 0.7 % SOLN Place 1 drop into both eyes daily as needed (allergies).   4  . sildenafil (VIAGRA) 100 MG tablet Take 0.5-1 tablets (50-100 mg total) by mouth daily as needed for erectile dysfunction. 15 tablet 3  . XARELTO 20 MG TABS tablet TAKE 1 TABLET DAILY WITH   SUPPER 90 tablet 1   No current facility-administered medications for this visit.    Allergies:   Aleve [naproxen sodium], Oxycodone, Sulfa antibiotics, Trazodone and nefazodone, and Vicodin [hydrocodone-acetaminophen]   Social  History:  The patient  reports that he quit smoking about 32 years ago. He has a 8.00 pack-year smoking history. He has never used smokeless tobacco. He reports that he does not drink alcohol or use drugs.   ROS:  Please see the history of present illness.   All other systems are personally reviewed and negative.    Exam:    Vital Signs:  Ht 6\' 2"  (1.88 m)   Wt 200 lb (90.7 kg)   BMI 25.68 kg/m   Well sounding and appearing, alert and conversant, regular work of breathing,  good skin color Eyes- anicteric, neuro- grossly intact, skin- no apparent rash or lesions or cyanosis, mouth- oral mucosa is pink  Labs/Other Tests and Data Reviewed:    Recent Labs: 03/07/2019: Magnesium 2.2 04/12/2019: ALT 14; BUN 14; Creatinine, Ser 0.94; Hemoglobin 15.5; Platelets 215; Potassium 4.9; Sodium 140   Wt Readings from Last 3 Encounters:  08/12/19 200 lb (90.7 kg)  06/11/19 202 lb 12.8 oz (92 kg)  05/07/19 200 lb (90.7 kg)     ASSESSMENT & PLAN:    1.  Persistent atrial fibrillation Doing well post ablation off AAD therapy chads2vasc score is 3.  conitnue xarelto  2. HTN Stable No change required today   Follow-up:  3 months with me   Patient Risk:  after full review of this patients clinical status, I feel that they are at moderate risk at this time.  Today, I have spent 15 minutes with the patient with telehealth technology discussing arrhythmia management .    Army Fossa, MD  08/12/2019 9:15 AM     [facility] [address] [place] [address] [phone] (office) [phone] (fax)

## 2019-09-02 ENCOUNTER — Other Ambulatory Visit: Payer: Self-pay

## 2019-09-02 ENCOUNTER — Emergency Department (HOSPITAL_BASED_OUTPATIENT_CLINIC_OR_DEPARTMENT_OTHER)
Admission: EM | Admit: 2019-09-02 | Discharge: 2019-09-03 | Disposition: A | Discharge status: Other Acute Inpatient Hospital | Payer: BC Managed Care – PPO | Attending: Emergency Medicine | Admitting: Emergency Medicine

## 2019-09-02 ENCOUNTER — Encounter (HOSPITAL_BASED_OUTPATIENT_CLINIC_OR_DEPARTMENT_OTHER): Payer: Self-pay

## 2019-09-02 DIAGNOSIS — Z955 Presence of coronary angioplasty implant and graft: Secondary | ICD-10-CM | POA: Insufficient documentation

## 2019-09-02 DIAGNOSIS — Z7901 Long term (current) use of anticoagulants: Secondary | ICD-10-CM | POA: Diagnosis not present

## 2019-09-02 DIAGNOSIS — E119 Type 2 diabetes mellitus without complications: Secondary | ICD-10-CM | POA: Diagnosis not present

## 2019-09-02 DIAGNOSIS — N451 Epididymitis: Secondary | ICD-10-CM

## 2019-09-02 DIAGNOSIS — N50819 Testicular pain, unspecified: Secondary | ICD-10-CM

## 2019-09-02 DIAGNOSIS — N50812 Left testicular pain: Secondary | ICD-10-CM

## 2019-09-02 DIAGNOSIS — Z7984 Long term (current) use of oral hypoglycemic drugs: Secondary | ICD-10-CM | POA: Insufficient documentation

## 2019-09-02 DIAGNOSIS — I25119 Atherosclerotic heart disease of native coronary artery with unspecified angina pectoris: Secondary | ICD-10-CM | POA: Diagnosis not present

## 2019-09-02 DIAGNOSIS — Z79899 Other long term (current) drug therapy: Secondary | ICD-10-CM | POA: Insufficient documentation

## 2019-09-02 DIAGNOSIS — Z87891 Personal history of nicotine dependence: Secondary | ICD-10-CM | POA: Diagnosis not present

## 2019-09-02 LAB — CBC
HCT: 42.1 % (ref 39.0–52.0)
Hemoglobin: 14.5 g/dL (ref 13.0–17.0)
MCH: 31.8 pg (ref 26.0–34.0)
MCHC: 34.4 g/dL (ref 30.0–36.0)
MCV: 92.3 fL (ref 80.0–100.0)
Platelets: 212 10*3/uL (ref 150–400)
RBC: 4.56 MIL/uL (ref 4.22–5.81)
RDW: 12.6 % (ref 11.5–15.5)
WBC: 8.7 10*3/uL (ref 4.0–10.5)
nRBC: 0 % (ref 0.0–0.2)

## 2019-09-02 LAB — URINALYSIS, ROUTINE W REFLEX MICROSCOPIC
Bilirubin Urine: NEGATIVE
Glucose, UA: 100 mg/dL — AB
Hgb urine dipstick: NEGATIVE
Ketones, ur: NEGATIVE mg/dL
Leukocytes,Ua: NEGATIVE
Nitrite: NEGATIVE
Protein, ur: NEGATIVE mg/dL
Specific Gravity, Urine: 1.02 (ref 1.005–1.030)
pH: 7 (ref 5.0–8.0)

## 2019-09-02 LAB — COMPREHENSIVE METABOLIC PANEL
ALT: 16 U/L (ref 0–44)
AST: 19 U/L (ref 15–41)
Albumin: 4.4 g/dL (ref 3.5–5.0)
Alkaline Phosphatase: 58 U/L (ref 38–126)
Anion gap: 10 (ref 5–15)
BUN: 14 mg/dL (ref 6–20)
CO2: 27 mmol/L (ref 22–32)
Calcium: 9.3 mg/dL (ref 8.9–10.3)
Chloride: 100 mmol/L (ref 98–111)
Creatinine, Ser: 1.13 mg/dL (ref 0.61–1.24)
GFR calc Af Amer: >60 mL/min (ref >60)
GFR calc non Af Amer: >60 mL/min (ref >60)
Glucose, Bld: 114 mg/dL — ABNORMAL HIGH (ref 70–99)
Potassium: 3.8 mmol/L (ref 3.5–5.1)
Sodium: 137 mmol/L (ref 135–145)
Total Bilirubin: 0.5 mg/dL (ref 0.3–1.2)
Total Protein: 8 g/dL (ref 6.5–8.1)

## 2019-09-02 LAB — LIPASE, BLOOD: Lipase: 54 U/L — ABNORMAL HIGH (ref 11–51)

## 2019-09-02 MED ORDER — SODIUM CHLORIDE 0.9% FLUSH
3.0000 mL | Freq: Once | INTRAVENOUS | Status: DC
  Filled 2019-09-02: qty 3

## 2019-09-02 NOTE — ED Notes (Signed)
Patient directed to [facility] ED for ultrasound of left testicle. Patient instructed to avoid eating/drinking and do not stop on the way. Patient acknowledges.

## 2019-09-02 NOTE — ED Triage Notes (Addendum)
Pt c/o left side abd pain that radiates to left groin and testicle-sx started today-pt noticed swelling to left testicle when showered just PTA-NAD-steady gait

## 2019-09-02 NOTE — Discharge Instructions (Addendum)
Your ultrasound showed an infection around the testicle called epididymitis, no sign of torsion.  Take the antibiotic until it is completely gone.  You may take acetaminophen for pain.  Apply ice packs as needed.  Return if pain is getting worse or you start running a fever.

## 2019-09-02 NOTE — ED Notes (Signed)
Report called to Kallie Locks, Therapist, sports, Camera operator, at [facility] ED

## 2019-09-02 NOTE — ED Provider Notes (Signed)
[facility] Provider Note   CSN: [id number] Arrival date & time: 09/02/19  2158     History Chief Complaint  Patient presents with  . Abdominal Pain  . Testicle Pain    Michael Livingston is a 59 y.o. male.  He has a history of A. fib on Xarelto.  He is presenting to the emergency department with complaint of lower abdominal and left testicular pain that gradually progressed over the course of today.  There is no particular trauma although he said he did a new abdominal crunch workout either yesterday or the day before that left him with a lot of soreness.  No difficulty passing urine.  He said he has had a sore testicle before but is never lasted this long or been this severe so he never got it evaluated.  No fevers chills nausea vomiting.  The history is provided by the patient.  Male GU Problem Presenting symptoms: scrotal pain   Presenting symptoms: no dysuria   Context: spontaneously   Relieved by:  Nothing Worsened by:  Movement and tactile pressure Ineffective treatments:  None tried Associated symptoms: abdominal pain, groin pain and scrotal swelling   Associated symptoms: no fever, no flank pain, no genital rash, no hematuria, no nausea, no priapism, no urinary retention and no vomiting   Risk factors: no urinary catheter        Past Medical History:  Diagnosis Date  . Allergy   . Atrial fibrillation ([place])    a. diagnosed in 01/2017 --> s/p DCCV. Started on Xarelto for anticoagulation.   Marland Kitchen CAD (coronary artery disease)    a. s/p DES to LAD in 2005 b. patent by cath in 2007 with 30% LCx stenosis noted at that time c. normal NST in 05/2011  . Chronic kidney disease    1 kidney stone  . Diabetes mellitus without complication (HCC)    type 2, no meds needed  . Hyperlipidemia   . Hypertension   . Myocardial infarction [facility])     Patient Active Problem List   Diagnosis Date Noted  . Persistent atrial fibrillation ([place]) 06/11/2019  .  Secondary hypercoagulable state ([place]) 06/11/2019  . Facial cellulitis 03/15/2019  . Significant closed head trauma within past 3 months 03/15/2019  . 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 DOE (dyspnea on exertion) 12/05/2017  . Coronary artery disease involving native coronary artery of native heart with angina pectoris ([place])   . Paroxysmal atrial fibrillation ([place]) 01/30/2017  . Preoperative clearance 01/24/2015  . Dysmetabolic syndrome X 0000000  . Hyperlipidemia with target LDL less than 70 04/02/2013  . Type 2 diabetes mellitus without complication, without long-term current use of insulin ([place]) 07/16/2012  . Other and unspecified hyperlipidemia 07/16/2012  . Coronary atherosclerosis 02/06/2009  . Generalized abdominal pain 02/06/2009    Past Surgical History:  Procedure Laterality Date  . ATRIAL FIBRILLATION ABLATION N/A 05/07/2019   Procedure: ATRIAL FIBRILLATION ABLATION;  Surgeon: Thompson Grayer, MD;  Location: [place] [facility];  Service: Cardiovascular;  Laterality: N/A;  . CARDIAC SURGERY  2005   stent  . CORONARY STENT PLACEMENT    . KNEE ARTHROSCOPY     left 2003, right 2005  . LEFT HEART CATH AND CORONARY ANGIOGRAPHY N/A 12/15/2017   Procedure: LEFT HEART CATH AND CORONARY ANGIOGRAPHY;  Surgeon: Troy Sine, MD;  Location: [place] [facility];  Service: Cardiovascular;  Laterality: N/A;  . SHOULDER SURGERY         Family  History  Problem Relation Age of Onset  . Diabetes Mother   . Heart disease Father   . Diabetes Father   . Heart disease Paternal Grandfather   . Colon cancer Neg Hx   . Esophageal cancer Neg Hx   . Stomach cancer Neg Hx   . Rectal cancer Neg Hx     Social History   Tobacco Use  . Smoking status: Former Smoker    Packs/day: 1.00    Years: 8.00    Pack years: 8.00    Quit date: 07/17/1987    Years since quitting: 32.1  . Smokeless tobacco: Never Used  Substance Use Topics  . Alcohol use: No    Alcohol/week: 0.0 standard  drinks  . Drug use: No    Home Medications Prior to Admission medications   Medication Sig Start Date End Date Taking? Authorizing Provider  diltiazem (CARDIZEM) 30 MG tablet Take 1 tablet (30 mg total) by mouth daily as needed. 08/13/18 08/13/19  Burchette, Alinda Sierras, MD  ezetimibe-simvastatin (VYTORIN) 10-20 MG tablet Take 1 tablet by mouth daily. 11/22/18   Troy Sine, MD  famotidine (PEPCID) 20 MG tablet Take 1 tablet (20 mg total) by mouth daily. 07/09/19   Burchette, Alinda Sierras, MD  lisinopril (ZESTRIL) 2.5 MG tablet Take 1 tablet (2.5 mg total) by mouth daily. 05/14/19   Burchette, Alinda Sierras, MD  metFORMIN (GLUCOPHAGE) 500 MG tablet Take 1 tablet (500 mg total) by mouth 2 (two) times daily with a meal. Please schedule follow up for further refills. [phone] 07/19/19   Burchette, Alinda Sierras, MD  metoprolol succinate (TOPROL XL) 25 MG 24 hr tablet Take 1 tablet (25 mg total) by mouth daily. 07/31/19   Troy Sine, MD  Multiple Vitamins-Minerals (MULTIVITAMIN WITH MINERALS) tablet Take 1 tablet by mouth at bedtime.     [provider]  nitroGLYCERIN (NITROSTAT) 0.4 MG SL tablet Place 1 tablet (0.4 mg total) under the tongue every 5 (five) minutes as needed for chest pain. 01/02/18   Duke, Tami Lin, PA  pantoprazole (PROTONIX) 40 MG tablet Take 1 tablet (40 mg total) by mouth daily. Take 30-60 min before first meal of the day 08/13/18   Burchette, Alinda Sierras, MD  PAZEO 0.7 % SOLN Place 1 drop into both eyes daily as needed (allergies).  10/03/15   [provider]  sildenafil (VIAGRA) 100 MG tablet Take 0.5-1 tablets (50-100 mg total) by mouth daily as needed for erectile dysfunction. 08/08/19   Burchette, Alinda Sierras, MD  XARELTO 20 MG TABS tablet TAKE 1 TABLET DAILY WITH   SUPPER 03/27/19   Burchette, Alinda Sierras, MD    Allergies    Aleve [naproxen sodium], Oxycodone, Sulfa antibiotics, Trazodone and nefazodone, and Vicodin [hydrocodone-acetaminophen]  Review of Systems   Review of Systems   Constitutional: Negative for fever.  HENT: Negative for sore throat.   Eyes: Negative for visual disturbance.  Respiratory: Negative for shortness of breath.   Cardiovascular: Negative for chest pain.  Gastrointestinal: Positive for abdominal pain. Negative for nausea and vomiting.  Genitourinary: Positive for scrotal swelling and testicular pain. Negative for dysuria, flank pain and hematuria.  Musculoskeletal: Negative for neck pain.  Skin: Negative for rash.  Neurological: Negative for headaches.    Physical Exam Updated Vital Signs BP 136/73 (BP Location: Left Arm)   Pulse 70   Temp 98.5 F (36.9 C) (Oral)   Resp 18   Ht 6\' 2"  (1.88 m)   Wt 93.9 kg  SpO2 100%   BMI 26.58 kg/m   Physical Exam Vitals and nursing note reviewed.  Constitutional:      Appearance: He is well-developed.  HENT:     Head: Normocephalic and atraumatic.  Eyes:     Conjunctiva/sclera: Conjunctivae normal.  Cardiovascular:     Rate and Rhythm: Normal rate and regular rhythm.     Heart sounds: No murmur.  Pulmonary:     Effort: Pulmonary effort is normal. No respiratory distress.     Breath sounds: Normal breath sounds.  Abdominal:     Palpations: Abdomen is soft.     Tenderness: There is abdominal tenderness in the suprapubic area. There is no guarding or rebound.     Hernia: No hernia is present.  Genitourinary:    Testes:        Right: Mass, tenderness or swelling not present.        Left: Mass, tenderness and swelling present.  Musculoskeletal:     Cervical back: Neck supple.  Skin:    General: Skin is warm and dry.     Capillary Refill: Capillary refill takes less than 2 seconds.  Neurological:     General: No focal deficit present.     Mental Status: He is alert.     ED Results / Procedures / Treatments   Labs (all labs ordered are listed, but only abnormal results are displayed) Labs Reviewed  URINALYSIS, ROUTINE W REFLEX MICROSCOPIC - Abnormal; Notable for the following  components:      Result Value   Glucose, UA 100 (*)    All other components within normal limits  CBC  LIPASE, BLOOD  COMPREHENSIVE METABOLIC PANEL    EKG None  Radiology No results found.  Procedures Procedures (including critical care time)  Medications Ordered in ED Medications  sodium chloride flush (NS) 0.9 % injection 3 mL ( Intravenous Canceled Entry 09/02/19 2220)    ED Course  I have reviewed the triage vital signs and the nursing notes.  Pertinent labs & imaging results that were available during my care of the patient were reviewed by me and considered in my medical decision making (see chart for details).    MDM Rules/Calculators/A&P                     Patient presented to [facility] with left testicular pain has been going on all day getting progressively worse.  Unfortunately ultrasound has gone for the day.  I examined the patient and think that torsion is a possibility although this may be epididymitis or hydrocele or varicocele.  Ultimately he needs an ultrasound and we are unable to get one here.  I talked to Dr. Eulis Foster from [facility] long emergency department who accepts the patient in transfer.  The patient looks very stable and drove here so I think the fastest way for him to get there would be to go by his private vehicle to the emergency department. Patient agreeable to transfer. Labs were pending at time of discharge.   Final Clinical Impression(s) / ED Diagnoses Final diagnoses:  Testicular pain, left    Rx / DC Orders ED Discharge Orders    None       Hayden Rasmussen, MD 09/03/19 1024

## 2019-09-03 ENCOUNTER — Telehealth (INDEPENDENT_AMBULATORY_CARE_PROVIDER_SITE_OTHER): Payer: BC Managed Care – PPO | Admitting: Family Medicine

## 2019-09-03 ENCOUNTER — Telehealth: Payer: Self-pay | Admitting: Family Medicine

## 2019-09-03 ENCOUNTER — Emergency Department (HOSPITAL_COMMUNITY): Payer: BC Managed Care – PPO

## 2019-09-03 DIAGNOSIS — N50819 Testicular pain, unspecified: Secondary | ICD-10-CM | POA: Diagnosis not present

## 2019-09-03 DIAGNOSIS — R1030 Lower abdominal pain, unspecified: Secondary | ICD-10-CM

## 2019-09-03 DIAGNOSIS — N451 Epididymitis: Secondary | ICD-10-CM

## 2019-09-03 MED ORDER — DOXYCYCLINE HYCLATE 100 MG PO CAPS
100.0000 mg | ORAL_CAPSULE | Freq: Two times a day (BID) | ORAL | 0 refills | Status: DC
Start: 2019-09-03 — End: 2019-09-03

## 2019-09-03 MED ORDER — DOXYCYCLINE HYCLATE 100 MG PO TABS
100.0000 mg | ORAL_TABLET | Freq: Once | ORAL | Status: AC
  Administered 2019-09-03: 100 mg via ORAL
  Filled 2019-09-03: qty 1

## 2019-09-03 MED ORDER — DOXYCYCLINE HYCLATE 100 MG PO CAPS
100.0000 mg | ORAL_CAPSULE | Freq: Two times a day (BID) | ORAL | 0 refills | Status: DC
Start: 2019-09-03 — End: 2019-09-10

## 2019-09-03 NOTE — ED Notes (Signed)
Pt verbalized dc instructions and follow up care. Alert and ambulatory. No iv. 

## 2019-09-03 NOTE — Progress Notes (Signed)
Patient ID: Michael Livingston, male   DOB: [birth year], 59 y.o.   MRN: [id number]  This visit type was conducted due to national recommendations for restrictions regarding the COVID-19 pandemic in an effort to limit this patient's exposure and mitigate transmission in our community.   Virtual Visit via Telephone Note  I connected with Michael Livingston on 09/03/19 at  4:00 PM EDT by telephone and verified that I am speaking with the correct person using two identifiers.   I discussed the limitations, risks, security and privacy concerns of performing an evaluation and management service by telephone and the availability of in person appointments. I also discussed with the patient that there may be a patient responsible charge related to this service. The patient expressed understanding and agreed to proceed.  Location patient: home Location provider: work or home office Participants present for the call: patient, provider Patient did not have a visit in the prior 7 days to address this/these issue(s).   History of Present Illness: This is a ER follow-up visit.  Michael Livingston had gone to the ER yesterday at [facility] with complaints of some lower abdominal and left testicular pain that had progressed over the day.  There is no history of known trauma.  He did do a new abdominal crunch workout that left him with some soreness.  No dysuria.  Urinalysis was done which is unremarkable.  CBC normal white count.  Comprehensive metabolic panel normal.  Lipase minimally elevated at 54.  Patient was transferred over to [facility] for scrotal ultrasound.  Ultrasound showed findings consistent with likely mild left-sided epididymitis.  He had some bilateral varicoceles and hydroceles.  Patient is monogamous.  No burning with urination.  No penile discharge.  No history of STD.  He was concerned because he had read about possible affiliations with STD and epididymitis.  He was prescribed doxycycline which he has  not yet started.  His pain is relatively mild at this time   Observations/Objective: Patient sounds cheerful and well on the phone. I do not appreciate any SOB. Speech and thought processing are grossly intact. Patient reported vitals:  Assessment and Plan:  Probable left sided epididymitis.  We explained that there causes other than STD related.  He is at very low risk for STD and we explained that they are noninfectious causes and even autoimmune causes potentially.  -He will go ahead and complete the doxycycline -Discussed importance of good support briefs -Consider intermittent icing for control of inflammation and pain -He is not a good candidate for nonsteroidals with his cardiac history and anticoagulation history -Follow-up to reexamine if not resolving over the next 1 to 2 weeks  Follow Up Instructions:  -As above   99441 5-10 99442 11-20 99443 21-30 I did not refer this patient for an OV in the next 24 hours for this/these issue(s).  I discussed the assessment and treatment plan with the patient. The patient was provided an opportunity to ask questions and all were answered. The patient agreed with the plan and demonstrated an understanding of the instructions.   The patient was advised to call back or seek an in-person evaluation if the symptoms worsen or if the condition fails to improve as anticipated.  I provided 23 minutes of non-face-to-face time during this encounter.   Carolann Littler, MD

## 2019-09-03 NOTE — Telephone Encounter (Signed)
See about setting up [place] or phone follow up.

## 2019-09-03 NOTE — Telephone Encounter (Signed)
Please advise 

## 2019-09-03 NOTE — ED Provider Notes (Signed)
59 year old male anticoagulated on rivaroxaban is transferred here from [facility] to obtain scrotal ultrasound because of left testicular pain.  He is currently resting comfortably and is in no acute distress.  Ultrasound with Doppler has been ordered.  Ultrasound shows epididymitis.  He is discharged with prescription for doxycycline.  Recommended follow-up with PCP in 2 weeks.  Results for orders placed or performed during the hospital encounter of 09/02/19  Lipase, blood  Result Value Ref Range   Lipase 54 (H) 11 - 51 U/L  Comprehensive metabolic panel  Result Value Ref Range   Sodium 137 135 - 145 mmol/L   Potassium 3.8 3.5 - 5.1 mmol/L   Chloride 100 98 - 111 mmol/L   CO2 27 22 - 32 mmol/L   Glucose, Bld 114 (H) 70 - 99 mg/dL   BUN 14 6 - 20 mg/dL   Creatinine, Ser 1.13 0.61 - 1.24 mg/dL   Calcium 9.3 8.9 - 10.3 mg/dL   Total Protein 8.0 6.5 - 8.1 g/dL   Albumin 4.4 3.5 - 5.0 g/dL   AST 19 15 - 41 U/L   ALT 16 0 - 44 U/L   Alkaline Phosphatase 58 38 - 126 U/L   Total Bilirubin 0.5 0.3 - 1.2 mg/dL   GFR calc non Af Amer >60 >60 mL/min   GFR calc Af Amer >60 >60 mL/min   Anion gap 10 5 - 15  CBC  Result Value Ref Range   WBC 8.7 4.0 - 10.5 K/uL   RBC 4.56 4.22 - 5.81 MIL/uL   Hemoglobin 14.5 13.0 - 17.0 g/dL   HCT 42.1 39.0 - 52.0 %   MCV 92.3 80.0 - 100.0 fL   MCH 31.8 26.0 - 34.0 pg   MCHC 34.4 30.0 - 36.0 g/dL   RDW 12.6 11.5 - 15.5 %   Platelets 212 150 - 400 K/uL   nRBC 0.0 0.0 - 0.2 %  Urinalysis, Routine w reflex microscopic  Result Value Ref Range   Color, Urine YELLOW YELLOW   APPearance CLEAR CLEAR   Specific Gravity, Urine 1.020 1.005 - 1.030   pH 7.0 5.0 - 8.0   Glucose, UA 100 (A) NEGATIVE mg/dL   Hgb urine dipstick NEGATIVE NEGATIVE   Bilirubin Urine NEGATIVE NEGATIVE   Ketones, ur NEGATIVE NEGATIVE mg/dL   Protein, ur NEGATIVE NEGATIVE mg/dL   Nitrite NEGATIVE NEGATIVE   Leukocytes,Ua NEGATIVE NEGATIVE   US SCROTUM W/DOPPLER  Result  Date: 09/03/2019 CLINICAL DATA:  Left testicular pain x1 day. EXAM: SCROTAL ULTRASOUND DOPPLER ULTRASOUND OF THE TESTICLES TECHNIQUE: Complete ultrasound examination of the testicles, epididymis, and other scrotal structures was performed. Color and spectral Doppler ultrasound were also utilized to evaluate blood flow to the testicles. COMPARISON:  None. FINDINGS: Right testicle Measurements: 5.0 cm x 2.5 cm x 3.6 cm. No mass or microlithiasis visualized. Left testicle Measurements: 4.7 cm x 3.3 cm x 3.2 cm. No mass or microlithiasis visualized. Right epididymis:  Normal in size and appearance. Left epididymis: Enlarged and heterogeneous in appearance. Mildly increased flow is noted throughout the left epididymis. Hydrocele: Small bilateral hydroceles are seen, left greater than right. Varicocele:  Bilateral varicoceles are noted. Pulsed Doppler interrogation of both testes demonstrates normal low resistance arterial and venous waveforms bilaterally. IMPRESSION: 1. Findings consistent with mild left-sided epididymitis. 2. Small bilateral hydroceles, left greater than right. 3. Bilateral varicoceles. Electronically Signed   By: [name] M.D.   On: [id number]      [name],  [name], MD 09/03/19 [phone]

## 2019-09-03 NOTE — Telephone Encounter (Signed)
Pt went to ER last night and would like a call back to go over labs they did for him while there.

## 2019-09-03 NOTE — Telephone Encounter (Signed)
Appt has been made.

## 2019-09-10 ENCOUNTER — Telehealth (INDEPENDENT_AMBULATORY_CARE_PROVIDER_SITE_OTHER): Payer: BC Managed Care – PPO | Admitting: Physician Assistant

## 2019-09-10 ENCOUNTER — Telehealth: Payer: Self-pay

## 2019-09-10 ENCOUNTER — Encounter: Payer: Self-pay | Admitting: Physician Assistant

## 2019-09-10 VITALS — Ht 74.0 in | Wt 200.0 lb

## 2019-09-10 DIAGNOSIS — I10 Essential (primary) hypertension: Secondary | ICD-10-CM | POA: Diagnosis not present

## 2019-09-10 DIAGNOSIS — I4819 Other persistent atrial fibrillation: Secondary | ICD-10-CM

## 2019-09-10 DIAGNOSIS — I2510 Atherosclerotic heart disease of native coronary artery without angina pectoris: Secondary | ICD-10-CM

## 2019-09-10 DIAGNOSIS — E119 Type 2 diabetes mellitus without complications: Secondary | ICD-10-CM

## 2019-09-10 DIAGNOSIS — E785 Hyperlipidemia, unspecified: Secondary | ICD-10-CM | POA: Diagnosis not present

## 2019-09-10 MED ORDER — EZETIMIBE-SIMVASTATIN 10-20 MG PO TABS: 1.0000 | ORAL_TABLET | Freq: Every day | ORAL | 1 refills | Status: DC

## 2019-09-10 NOTE — Progress Notes (Signed)
Virtual Visit via Telephone Note   This visit type was conducted due to national recommendations for restrictions regarding the COVID-19 Pandemic (e.g. social distancing) in an effort to limit this patient's exposure and mitigate transmission in our community.  Due to his co-morbid illnesses, this patient is at least at moderate risk for complications without adequate follow up.  This format is felt to be most appropriate for this patient at this time.  The patient did not have access to video technology/had technical difficulties with video requiring transitioning to audio format only (telephone).  All issues noted in this document were discussed and addressed.  No physical exam could be performed with this format.  Please refer to the patient's chart for his  consent to telehealth for [facility].   The patient was identified using 2 identifiers.  Date:  09/10/2019   ID:  Michael Livingston, DOB Sep 26, 1960, MRN [id number]  Patient Location: Home Provider Location: Home  PCP:  Eulas Post, MD  Cardiologist:  Shelva Majestic, MD  Electrophysiologist:  None Dr. Rayann Heman  Evaluation Performed:  Follow-Up Visit  Chief Complaint:  followup  History of Present Illness:    Michael Livingston is a 59 y.o. male with PMH of CAD, atrial fibrillation, HTN, HLD and DM2.  He underwent stenting of proximal and mid LAD in October 2005.  Repeat cardiac catheterization in October 2007 revealed widely patent LAD stent, mild 30% left circumflex lesion.  Myoview in January 2013 showed normal perfusion.  He was first diagnosed with atrial fibrillation in September 2018 and went to [facility].  He underwent successful DC cardioversion at the time.  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  Unfortunately, he had a recurrent atrial fibrillation requiring  the reinitiation of Xarelto therapy during the same year.  Last cardiac catheterization performed in August 2019 showed patent overlapping proximal to mid LAD stents, otherwise no significant residual CAD.  He had several cardioversions and eventually underwent atrial fibrillation ablation by Dr. Rayann Heman on 05/07/2019.  Postprocedure, he was continued on Xarelto therapy.  He was last seen virtually by Dr. Rayann Heman on 08/12/2019 at which time he was doing well.  Patient presents today for cardiology office visit.  He denies any recent chest pain or prolonged palpitation.  He did have a episode of palpitation shortly after the ablation procedure in December, however no recurrence since then.  He is still on a very low-dose of metoprolol succinate 25 mg daily and also Xarelto.  He denies any recent bleeding issues such as blood in the stool or in the urine.  Overall, he stays quite active working and has been under some degree of stress.  But otherwise he is doing well from the heart perspective and can follow-up in 6 months with Dr. Claiborne Billings   The patient does not have symptoms concerning for COVID-19 infection (fever, chills, cough, or new shortness of breath).    Past Medical History:  Diagnosis Date  . Allergy   . Atrial fibrillation ([place])    a. diagnosed in 01/2017 --> s/p DCCV. Started on Xarelto for anticoagulation.   Marland Kitchen CAD (coronary artery disease)    a. s/p DES to LAD in 2005 b. patent by cath in 2007 with 30% LCx stenosis noted at that time c. normal NST in 05/2011  . Chronic kidney  disease    1 kidney stone  . Diabetes mellitus without complication (HCC)    type 2, no meds needed  . Hyperlipidemia   . Hypertension   . Myocardial infarction [facility])    Past Surgical History:  Procedure Laterality Date  . ATRIAL FIBRILLATION ABLATION N/A 05/07/2019   Procedure: ATRIAL FIBRILLATION ABLATION;  Surgeon: Thompson Grayer, MD;  Location: [facility];  Service: Cardiovascular;  Laterality: N/A;    . CARDIAC SURGERY  2005   stent  . CORONARY STENT PLACEMENT    . KNEE ARTHROSCOPY     left 2003, right 2005  . LEFT HEART CATH AND CORONARY ANGIOGRAPHY N/A 12/15/2017   Procedure: LEFT HEART CATH AND CORONARY ANGIOGRAPHY;  Surgeon: Troy Sine, MD;  Location: [facility];  Service: Cardiovascular;  Laterality: N/A;  . SHOULDER SURGERY       Current Meds  Medication Sig  . diltiazem (CARDIZEM) 30 MG tablet Take 1 tablet (30 mg total) by mouth daily as needed.  . ezetimibe-simvastatin (VYTORIN) 10-20 MG tablet Take 1 tablet by mouth daily.  . famotidine (PEPCID) 20 MG tablet Take 1 tablet (20 mg total) by mouth daily.  Marland Kitchen lisinopril (ZESTRIL) 2.5 MG tablet Take 1 tablet (2.5 mg total) by mouth daily.  . metFORMIN (GLUCOPHAGE) 500 MG tablet Take 1 tablet (500 mg total) by mouth 2 (two) times daily with a meal. Please schedule follow up for further refills. [phone]  . metoprolol succinate (TOPROL XL) 25 MG 24 hr tablet Take 1 tablet (25 mg total) by mouth daily.  . Multiple Vitamins-Minerals (MULTIVITAMIN WITH MINERALS) tablet Take 1 tablet by mouth at bedtime.   . pantoprazole (PROTONIX) 40 MG tablet Take 1 tablet (40 mg total) by mouth daily. Take 30-60 min before first meal of the day  . PAZEO 0.7 % SOLN Place 1 drop into both eyes daily as needed (allergies).   . sildenafil (VIAGRA) 100 MG tablet Take 0.5-1 tablets (50-100 mg total) by mouth daily as needed for erectile dysfunction.  Alveda Reasons 20 MG TABS tablet TAKE 1 TABLET DAILY WITH   SUPPER     Allergies:   Aleve [naproxen sodium], Oxycodone, Sulfa antibiotics, Trazodone and nefazodone, and Vicodin [hydrocodone-acetaminophen]   Social History   Tobacco Use  . Smoking status: Former Smoker    Packs/day: 1.00    Years: 8.00    Pack years: 8.00    Quit date: 07/17/1987    Years since quitting: 32.1  . Smokeless tobacco: Never Used  Substance Use Topics  . Alcohol use: No    Alcohol/week: 0.0 standard drinks  .  Drug use: No     Family Hx: The patient's family history includes Diabetes in his father and mother; Heart disease in his father and paternal grandfather. There is no history of Colon cancer, Esophageal cancer, Stomach cancer, or Rectal cancer.  ROS:   Please see the history of present illness.     All other systems reviewed and are negative.   Prior CV studies:   The following studies were reviewed today:  Cath 12/15/2017  Prox LAD to Mid LAD lesion is 20% stenosed.  The left ventricular systolic function is normal.  LV end diastolic pressure is normal.  The left ventricular ejection fraction is 55-65% by visual estimate.   No significant residual CAD with overlapping stents in the proximal to mid LAD with smooth intimal hyperplasia of 20%; normal ramus intermediate, dominant left circumflex, and nondominant RCA.  Normal LV  function with an ejection fraction at 55 to 60% without focal segmental wall motion abnormalities.  RECOMMENDATION: Medical therapy with recurrent PAF, consider  EP evaluation for consideration of candidacy for ablation. Recommend to resume Rivaroxaban, at currently prescribed dose and frequency, on 12/16/2017.  Concurrent antiplatelet therapy not recommended.    Echo 04/01/2019 IMPRESSIONS  1. Left ventricular ejection fraction, by visual estimation, is 60 to  65%. The left ventricle has normal function. There is no left ventricular  hypertrophy.  2. Global right ventricle has normal systolic function.The right  ventricular size is normal. No increase in right ventricular wall  thickness.  3. Left atrial size was normal.  4. Right atrial size was normal.  5. Trivial pericardial effusion is present.  6. The mitral valve is normal in structure. Trace mitral valve  regurgitation. No evidence of mitral stenosis.  7. The tricuspid valve is normal in structure. Tricuspid valve  regurgitation is not demonstrated.  8. The aortic valve is normal  in structure. Aortic valve regurgitation is  not visualized. No evidence of aortic valve sclerosis or stenosis.  9. The pulmonic valve was normal in structure. Pulmonic valve  regurgitation is trivial.  10. TR signal is inadequate for assessing pulmonary artery systolic  pressure.  11. The inferior vena cava is normal in size with <50% respiratory  variability, suggesting right atrial pressure of 8 mmHg.    Labs/Other Tests and Data Reviewed:    EKG:  An ECG dated 06/11/2019 was personally reviewed today and demonstrated:  Normal sinus rhythm without significant ST-T wave changes.  Recent Labs: 03/07/2019: Magnesium 2.2 09/02/2019: ALT 16; BUN 14; Creatinine, Ser 1.13; Hemoglobin 14.5; Platelets 212; Potassium 3.8; Sodium 137   Recent Lipid Panel Lab Results  Component Value Date/Time   CHOL 123 04/12/2019 08:28 AM   CHOL 99 04/02/2013 09:03 AM   TRIG 73 04/12/2019 08:28 AM   TRIG 59 04/02/2013 09:03 AM   HDL 42 04/12/2019 08:28 AM   HDL 42 04/02/2013 09:03 AM   CHOLHDL 2.9 04/12/2019 08:28 AM   CHOLHDL 3 10/12/2018 08:52 AM   LDLCALC 66 04/12/2019 08:28 AM   LDLCALC 45 04/02/2013 09:03 AM    Wt Readings from Last 3 Encounters:  09/10/19 200 lb (90.7 kg)  09/02/19 207 lb (93.9 kg)  08/12/19 200 lb (90.7 kg)     Objective:    Vital Signs:  Ht 6\' 2"  (1.88 m)   Wt 200 lb (90.7 kg)   BMI 25.68 kg/m    VITAL SIGNS:  reviewed  ASSESSMENT & PLAN:    1. CAD: Last cardiac catheterization was in 2019.  He denies any recent exertional chest pain.  Continue metoprolol and Vytorin  2. Persistent atrial fibrillation: Underwent atrial fibrillation ablation in December 2020.  He had a single episode of palpitation shortly afterward, however has not had any issues since.  Continue metoprolol and Xarelto  3. Hypertension: Continue on current therapy  4. Hyperlipidemia: Continue Vytorin  5. DM2: Managed by primary care provider   COVID-19 Education: The signs and symptoms of  COVID-19 were discussed with the patient and how to seek care for testing (follow up with PCP or arrange E-visit).  The importance of social distancing was discussed today.  Time:   Today, I have spent 7 minutes with the patient with telehealth technology discussing the above problems.     Medication Adjustments/Labs and Tests Ordered: Current medicines are reviewed at length with the patient today.  Concerns regarding medicines are outlined  above.   Tests Ordered: No orders of the defined types were placed in this encounter.   Medication Changes: No orders of the defined types were placed in this encounter.   Follow Up:  Either In Person or Virtual in 6 month(s)  Signed, Almyra Deforest, [place]  09/10/2019 10:31 AM    [place]

## 2019-09-10 NOTE — Telephone Encounter (Signed)
  Patient Consent for Virtual Visit         Michael Livingston has provided verbal consent on 09/10/2019 for a virtual visit (video or telephone).   CONSENT FOR VIRTUAL VISIT FOR:  Michael Livingston  By participating in this virtual visit I agree to the following:  I hereby voluntarily request, consent and authorize [facility] and its employed or contracted physicians, physician assistants, nurse practitioners or other licensed health care professionals (the Practitioner), to provide me with telemedicine health care services (the "Services") as deemed necessary by the treating Practitioner. I acknowledge and consent to receive the Services by the Practitioner via telemedicine. I understand that the telemedicine visit will involve communicating with the Practitioner through live audiovisual communication technology and the disclosure of certain medical information by electronic transmission. I acknowledge that I have been given the opportunity to request an in-person assessment or other available alternative prior to the telemedicine visit and am voluntarily participating in the telemedicine visit.  I understand that I have the right to withhold or withdraw my consent to the use of telemedicine in the course of my care at any time, without affecting my right to future care or treatment, and that the Practitioner or I may terminate the telemedicine visit at any time. I understand that I have the right to inspect all information obtained and/or recorded in the course of the telemedicine visit and may receive copies of available information for a reasonable fee.  I understand that some of the potential risks of receiving the Services via telemedicine include:  Marland Kitchen Delay or interruption in medical evaluation due to technological equipment failure or disruption; . Information transmitted may not be sufficient (e.g. poor resolution of images) to allow for appropriate medical decision making by the  Practitioner; and/or  . In rare instances, security protocols could fail, causing a breach of personal health information.  Furthermore, I acknowledge that it is my responsibility to provide information about my medical history, conditions and care that is complete and accurate to the best of my ability. I acknowledge that Practitioner's advice, recommendations, and/or decision may be based on factors not within their control, such as incomplete or inaccurate data provided by me or distortions of diagnostic images or specimens that may result from electronic transmissions. I understand that the practice of medicine is not an exact science and that Practitioner makes no warranties or guarantees regarding treatment outcomes. I acknowledge that a copy of this consent can be made available to me via my patient portal ([facility]), or I can request a printed copy by calling the office of [facility].    I understand that my insurance will be billed for this visit.   I have read or had this consent read to me. . I understand the contents of this consent, which adequately explains the benefits and risks of the Services being provided via telemedicine.  . I have been provided ample opportunity to ask questions regarding this consent and the Services and have had my questions answered to my satisfaction. . I give my informed consent for the services to be provided through the use of telemedicine in my medical care

## 2019-09-10 NOTE — Patient Instructions (Signed)
Medication Instructions:  °Your physician recommends that you continue on your current medications as directed. Please refer to the Current Medication list given to you today. ° °*If you need a refill on your cardiac medications before your next appointment, please call your pharmacy* ° °Lab Work: °NONE ordered at this time of appointment  ° °If you have labs (blood work) drawn today and your tests are completely normal, you will receive your results only by: °MyChart Message (if you have MyChart) OR °A paper copy in the mail °If you have any lab test that is abnormal or we need to change your treatment, we will call you to review the results. ° °Testing/Procedures: °NONE ordered at this time of appointment  ° °Follow-Up: °At [facility], you and your health needs are our priority.  As part of our continuing mission to provide you with exceptional heart care, we have created designated Provider Care Teams.  These Care Teams include your primary Cardiologist (physician) and Advanced Practice Providers (APPs -  Physician Assistants and Nurse Practitioners) who all work together to provide you with the care you need, when you need it. ° °Your next appointment:   °6 month(s) ° °The format for your next appointment:   °In Person ° °Provider:   °Thomas Kelly, MD   ° °Other Instructions ° ° °

## 2019-09-10 NOTE — Telephone Encounter (Signed)
Called patient to discuss AVS instructions gave Michael Livingston's recommendations and patient voiced understanding. AVS summary mailed to patient.    

## 2019-09-11 ENCOUNTER — Encounter: Payer: Self-pay | Admitting: Family Medicine

## 2019-09-13 ENCOUNTER — Other Ambulatory Visit: Payer: Self-pay

## 2019-09-16 ENCOUNTER — Other Ambulatory Visit: Payer: Self-pay

## 2019-09-16 ENCOUNTER — Ambulatory Visit (INDEPENDENT_AMBULATORY_CARE_PROVIDER_SITE_OTHER): Payer: BC Managed Care – PPO | Admitting: Family Medicine

## 2019-09-16 ENCOUNTER — Encounter: Payer: Self-pay | Admitting: Family Medicine

## 2019-09-16 VITALS — BP 126/66 | HR 70 | Temp 97.6°F | Wt 206.1 lb

## 2019-09-16 DIAGNOSIS — N451 Epididymitis: Secondary | ICD-10-CM | POA: Diagnosis not present

## 2019-09-16 DIAGNOSIS — I861 Scrotal varices: Secondary | ICD-10-CM | POA: Diagnosis not present

## 2019-09-16 DIAGNOSIS — E119 Type 2 diabetes mellitus without complications: Secondary | ICD-10-CM | POA: Diagnosis not present

## 2019-09-16 DIAGNOSIS — N433 Hydrocele, unspecified: Secondary | ICD-10-CM

## 2019-09-16 LAB — POCT GLYCOSYLATED HEMOGLOBIN (HGB A1C): Hemoglobin A1C: 5.7 % — AB (ref 4.0–5.6)

## 2019-09-16 NOTE — Patient Instructions (Signed)
Hydrocele, Adult  A hydrocele is a collection of fluid in the loose pouch of skin that holds the testicles (scrotum). This may happen because:  The amount of fluid produced in the scrotum is not absorbed by the rest of the body.  Fluid from the abdomen fills the scrotum. Normally, the testicles develop in the abdomen then move (drop) into to the scrotum before birth. The tube that the testicles travel through usually closes after the testicles drop. If the tube does not close, fluid from the abdomen can fill the scrotum. This is less common in adults.  What are the causes?  The cause of a hydrocele in adults is usually not known. However, it may be caused by:  An injury to the scrotum.  An infection (epididymitis).  Decreased blood flow to the scrotum.  Twisting of a testicle (testicular torsion).  A birth defect.  A tumor or cancer of the testicle.  What are the signs or symptoms?  A hydrocele feels like a water-filled balloon. It may also feel heavy. Other symptoms include:  Swelling of the scrotum. The swelling may decrease when you lie down. You may also notice more swelling at night than in the morning.  Swelling of the groin.  Mild discomfort in the scrotum.  Pain. This can develop if the hydrocele was caused by infection or twisting. The larger the hydrocele, the more likely you are to have pain.  How is this diagnosed?  This condition may be diagnosed based on:  Physical exam.  Medical history.  You may also have other tests, including:  Imaging tests, such as ultrasound.  Blood or urine tests.  How is this treated?  Most hydroceles go away on their own. If you have no discomfort or pain, your health care provider may suggest close monitoring of your condition (called watch and wait or watchful waiting) until the condition goes away or symptoms develop. If treatment is needed, it may include:  Treating an underlying condition. This may include using an antibiotic medicine to treat an infection.  Surgery to  stop fluid from collecting in the scrotum.  Surgery to drain the fluid. Options include:  Needle aspiration. A needle is used to drain fluid. However, the fluid buildup will come back quickly.  Hydrocelectomy. For this procedure, an incision is made in the scrotum to remove the fluid sac.  Follow these instructions at home:  Watch the hydrocele for any changes.  Take over-the-counter and prescription medicines only as told by your health care provider.  If you were prescribed an antibiotic medicine, use it as told by your health care provider. Do not stop taking the antibiotic even if you start to feel better.  Keep all follow-up visits as told by your health care provider. This is important.  Contact a health care provider if:  You notice any changes in the hydrocele.  The swelling in your scrotum or groin gets worse.  The hydrocele becomes red, firm, painful, or tender to the touch.  You have a fever.  Get help right away if you:  Develop a lot of pain, or your pain becomes worse.  Summary  A hydrocele is a collection of fluid in the loose pouch of skin that holds the testicles (scrotum).  Hydroceles can cause swelling, discomfort, and sometimes pain.  In adults, the cause of a hydrocele usually is not known. However, it is sometimes caused by an infection or a rotation and twisting of the scrotum.  Treatment is usually not   may be given to ease the pain. This information is not intended to replace advice given to you by your health care provider. Make sure you discuss any questions you have with your health care provider. Document Revised: 05/06/2017 Document Reviewed: 05/06/2017 Elsevier Patient Education  [place]. Varicocele  A varicocele is a swelling of veins in the scrotum. The scrotum is the sac that contains the testicles. Varicoceles can  occur on either side of the scrotum, but they are more common on the left side. They occur most often in teenage boys and young men. In most cases, varicoceles are not a serious problem. They are usually small and painless and do not require treatment. Tests may be done to confirm the diagnosis. Treatment may be needed if:  A varicocele is large, causes a lot of pain, or causes pain when exercising.  Varicoceles are found on both sides of the scrotum.  A varicocele causes a decrease in the size of the testicle in a growing adolescent.  The person has fertility problems. What are the causes? This condition is the result of valves in the veins not working properly. Valves in the veins help to return blood from the scrotum and testicles to the heart. If these valves do not work well, blood flows backward and backs up into the veins, which causes the veins to swell. This is similar to what happens when varicose veins form in the leg. What are the signs or symptoms? Most varicoceles do not cause any symptoms. If symptoms do occur, they may include:  Swelling on one side of the scrotum. The swelling may be more obvious when you are standing up.  A lumpy feeling in the scrotum.  A heavy feeling on one side of the scrotum.  A dull ache in the scrotum, especially after exercise or prolonged standing or sitting.  Slower growth or reduced size of the testicle on the side of the varicocele (in young males).  Problems with fathering a child (fertility). This can occur if the testicle does not grow normally or if the condition causes problems with the sperm, such as a low sperm count or sperm that are not able to reach the egg (poor motility). How is this diagnosed? This condition is diagnosed based on:  Your medical history.  A physical exam. Your health care provider may inspect and feel (palpate) the scrotal area to check for swollen or enlarged veins.  An ultrasound. This may be done to  confirm the diagnosis and to help rule out other causes of the swelling. How is this treated? Treatment is usually not needed for this condition. If you have any pain, your health care provider may prescribe or recommend medicine to help relieve it. You may need regular exams so your health care provider can monitor the varicocele to ensure that it does not cause problems. When further treatment is needed, it may involve one of these options:  Varicocelectomy. This is a surgery in which the swollen veins are tied off so that the flow of blood goes to other veins instead.  Embolization. In this procedure, a small, thin tube (catheter) is used to place metal coils or other blocking items in the veins. This cuts off the blood flow to the swollen veins. Follow these instructions at home:  Take over-the-counter and prescription medicines only as told by your health care provider.  Wear supportive underwear.  Use an athletic supporter when participating in sports activities.  Keep all follow-up visits as  told by your health care provider. This is important. Contact a health care provider if:  Your pain is increasing.  You have redness in the affected area.  Your testicle becomes enlarged, swollen, or painful.  You have swelling that does not decrease when you are lying down.  One of your testicles is smaller than the other. Get help right away if:  You develop swelling in your legs.  You have difficulty breathing. Summary  Varicocele is a condition in which the veins in the scrotum are swollen or enlarged.  In most cases, varicoceles do not require treatment.  Treatment may be needed if you have pain, have problems with infertility, or have a smaller testicle associated with the varicocele.  In some cases, the condition may be treated with a procedure to cut off the flow of blood to the swollen veins. This information is not intended to replace advice given to you by your health  care provider. Make sure you discuss any questions you have with your health care provider. Document Revised: 07/13/2018 Document Reviewed: 07/13/2018 Elsevier Patient Education  [place].

## 2019-09-16 NOTE — Progress Notes (Signed)
Subjective:     Patient ID: Michael Livingston, male   DOB: [birth year], 59 y.o.   MRN: [id number]  HPI   Michael Livingston seen for recent ER follow-up and medical follow-up.  He has history of CAD, paroxysmal atrial fibrillation, pulmonary sarcoidosis, type 2 diabetes, dyslipidemia.  Presented to ER recently with scrotal pain left side.  Ended up with ultrasound which showed evidence for acute epididymitis.  He also had evidence for bilateral varicocele and hydroceles.  He states he has always had some prominence of the left side of the scrotum compared to the right.  No prior history of epididymitis.  He is monogamous.  He was treated with doxycycline and symptoms did improve following that.  He had urinalysis in the ER which was unremarkable.  He has no pain at this time.  No dysuria.  Denies any BPH symptoms or slow stream  Type 2 diabetes.  Last A1c 6.5% several months ago.  Does not monitor blood sugars regularly.  Treated with Metformin.  He has history of chronic atrial fibrillation and is maintained on Xarelto.  No recent dizziness.  No chest pains.  Past Medical History:  Diagnosis Date  . Allergy   . Atrial fibrillation ([place])    a. diagnosed in 01/2017 --> s/p DCCV. Started on Xarelto for anticoagulation.   Marland Kitchen CAD (coronary artery disease)    a. s/p DES to LAD in 2005 b. patent by cath in 2007 with 30% LCx stenosis noted at that time c. normal NST in 05/2011  . Chronic kidney disease    1 kidney stone  . Diabetes mellitus without complication (HCC)    type 2, no meds needed  . Hyperlipidemia   . Hypertension   . Myocardial infarction [facility])    Past Surgical History:  Procedure Laterality Date  . ATRIAL FIBRILLATION ABLATION N/A 05/07/2019   Procedure: ATRIAL FIBRILLATION ABLATION;  Surgeon: Thompson Grayer, MD;  Location: [place] [facility];  Service: Cardiovascular;  Laterality: N/A;  . CARDIAC SURGERY  2005   stent  . CORONARY STENT PLACEMENT    . KNEE ARTHROSCOPY     left 2003, right  2005  . LEFT HEART CATH AND CORONARY ANGIOGRAPHY N/A 12/15/2017   Procedure: LEFT HEART CATH AND CORONARY ANGIOGRAPHY;  Surgeon: Troy Sine, MD;  Location: [place] [facility];  Service: Cardiovascular;  Laterality: N/A;  . SHOULDER SURGERY      reports that he quit smoking about 32 years ago. He has a 8.00 pack-year smoking history. He has never used smokeless tobacco. He reports that he does not drink alcohol or use drugs. family history includes Diabetes in his father and mother; Heart disease in his father and paternal grandfather. Allergies  Allergen Reactions  . Aleve [Naproxen Sodium] Swelling and Other (See Comments)    Mouth swells  . Oxycodone Other (See Comments)    Rendered the patient unable to fall asleep AND he "almost passed out"  . Sulfa Antibiotics Hives  . Trazodone And Nefazodone Other (See Comments)    Seeing colored lights  . Vicodin [Hydrocodone-Acetaminophen] Other (See Comments)    Rendered the patient unable to fall asleep AND he "almost passed out"  '  Review of Systems  Constitutional: Negative for fatigue.  Eyes: Negative for visual disturbance.  Respiratory: Negative for cough, chest tightness and shortness of breath.   Cardiovascular: Negative for chest pain, palpitations and leg swelling.  Genitourinary: Negative for discharge, dysuria, genital sores, hematuria and testicular pain.  Neurological: Negative  for dizziness, syncope, weakness, light-headedness and headaches.       Objective:   Physical Exam Vitals reviewed.  Constitutional:      Appearance: Normal appearance.  Cardiovascular:     Rate and Rhythm: Normal rate and regular rhythm.  Pulmonary:     Effort: Pulmonary effort is normal.     Breath sounds: Normal breath sounds.  Genitourinary:    Comments: Testes are normal.  He has hydrocele left greater than right.  Also has some very mild tenderness left epididymis but no masses palpated there Neurological:     Mental Status: He is  alert.        Assessment:     #1 recent left-sided acute epididymitis.  We explained that they are infectious, traumatic, and autoimmune causes.  He does not have significant risk factors for STD and we explained an older gentleman coliform bacteria for example and Pseudomonas may be more likely pathogens versus chlamydia more often seen in younger patients with multiple sexual partners.Marland Kitchen  He is improved following recent doxycycline course  #2 type 2 diabetes which has been well controlled on Metformin.  A1c today 5.7% which is improved compared to last check  #3 benign hydroceles and varicoceles noted on recent ultrasound    Plan:     -Discussed recent findings on ultrasound and in review of his recent labs in the ER.  He is reassured that varicoceles and hydroceles do not need any further intervention unless they are enlarging or becoming symptomatic  -No further antibiotics at this time  -Continue low glycemic diet  -Routine follow-up in 6 months to reassess A1c sooner as needed  Eulas Post MD [facility]

## 2019-09-18 ENCOUNTER — Other Ambulatory Visit: Payer: Self-pay

## 2019-09-18 ENCOUNTER — Other Ambulatory Visit: Payer: Self-pay | Admitting: Family Medicine

## 2019-09-18 DIAGNOSIS — R0609 Other forms of dyspnea: Secondary | ICD-10-CM

## 2019-09-18 MED ORDER — RIVAROXABAN 20 MG PO TABS: ORAL_TABLET | ORAL | 1 refills | Status: DC

## 2019-11-18 ENCOUNTER — Ambulatory Visit: Payer: BC Managed Care – PPO | Admitting: Internal Medicine

## 2019-11-27 ENCOUNTER — Other Ambulatory Visit: Payer: Self-pay

## 2019-11-27 ENCOUNTER — Ambulatory Visit (INDEPENDENT_AMBULATORY_CARE_PROVIDER_SITE_OTHER): Payer: BC Managed Care – PPO | Admitting: Physician Assistant

## 2019-11-27 VITALS — BP 138/68 | HR 72 | Ht 74.0 in | Wt 207.0 lb

## 2019-11-27 DIAGNOSIS — I4819 Other persistent atrial fibrillation: Secondary | ICD-10-CM | POA: Diagnosis not present

## 2019-11-27 DIAGNOSIS — I2510 Atherosclerotic heart disease of native coronary artery without angina pectoris: Secondary | ICD-10-CM | POA: Diagnosis not present

## 2019-11-27 DIAGNOSIS — I10 Essential (primary) hypertension: Secondary | ICD-10-CM | POA: Diagnosis not present

## 2019-11-27 NOTE — Patient Instructions (Signed)

## 2019-11-27 NOTE — Progress Notes (Signed)
Cardiology Office Note Date:  11/27/2019  Patient ID:  Michael Livingston, Michael Livingston 1960-05-21, MRN [id number] PCP:  Eulas Post, MD  Cardiologist:  Dr. Claiborne Billings EP: Dr. Rayann Heman     Chief Complaint: 6 mo  History of Present Illness: Michael Livingston is a 59 y.o. male with history of CAD (PCI to LAD 2005, last cardiac catheterization performed in August 2019 showed patent overlapping proximal to mid LAD stents, otherwise no significant residual CAD ), HTN, HLD, DM, AFib  He comes in today to be seen for Dr. Rayann Heman, last seen by him 08/12/2019 via tele health visit doing well, no changes were made.  More recently had a tele health visit Sharlotte Alamo, PA 09/10/19, doing well, no changes were made.  He is doing quite well.  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  He and his wife/son walk or bike about 3-4x week with very good exertional capacity  No CP, SOB, or DOE. He last night after back in bed from using the BR had about an hour of palpitations, not fast but could feel his heart beat skip/extra best every few beats, did not make him feel poorly.  He thought about getting a PRN dilt but did not and it stopped after about an hour.  This is the first he thinks since his ablation.  No dizzy spells, no near syncope or syncope. He reports compliance with his xarelto and denies any bleeding or signs of bleeding   Afib hx Diagnosed 2018 PVI ablation 05/07/2019 AAD None to date  Past Medical History:  Diagnosis Date  . Allergy   . Atrial fibrillation ([place])    a. diagnosed in 01/2017 --> s/p DCCV. Started on Xarelto for anticoagulation.   Marland Kitchen CAD (coronary artery disease)    a. s/p DES to LAD in 2005 b. patent by cath in 2007 with 30% LCx stenosis noted at that time c. normal NST in 05/2011  . Chronic kidney disease    1 kidney stone  . Diabetes mellitus without complication (HCC)    type 2, no meds needed    . Hyperlipidemia   . Hypertension   . Myocardial infarction [facility])     Past Surgical History:  Procedure Laterality Date  . ATRIAL FIBRILLATION ABLATION N/A 05/07/2019   Procedure: ATRIAL FIBRILLATION ABLATION;  Surgeon: Thompson Grayer, MD;  Location: [place] [facility];  Service: Cardiovascular;  Laterality: N/A;  . CARDIAC SURGERY  2005   stent  . CORONARY STENT PLACEMENT    . KNEE ARTHROSCOPY     left 2003, right 2005  . LEFT HEART CATH AND CORONARY ANGIOGRAPHY N/A 12/15/2017   Procedure: LEFT HEART CATH AND CORONARY ANGIOGRAPHY;  Surgeon: Troy Sine, MD;  Location: [place] [facility];  Service: Cardiovascular;  Laterality: N/A;  . SHOULDER SURGERY      Current Outpatient Medications  Medication Sig Dispense Refill  . diltiazem (CARDIZEM) 30 MG tablet Take 1 tablet (30 mg total) by mouth daily as needed. 90 tablet 3  . ezetimibe-simvastatin (VYTORIN) 10-20 MG tablet Take 1 tablet by mouth daily. (Patient taking differently: Take 0.5 tablets by mouth daily. Take half tablet by mouth daily) 90 tablet 1  . famotidine (PEPCID) 20 MG tablet Take 1 tablet (20 mg total) by mouth daily. 90 tablet 3  . lisinopril (ZESTRIL) 2.5 MG tablet Take 1 tablet (2.5 mg total) by mouth daily.  90 tablet 3  . metFORMIN (GLUCOPHAGE) 500 MG tablet Take 1 tablet (500 mg total) by mouth 2 (two) times daily with a meal. Please schedule follow up for further refills. [phone] 180 tablet 0  . metoprolol succinate (TOPROL XL) 25 MG 24 hr tablet Take 1 tablet (25 mg total) by mouth daily. 90 tablet 2  . Multiple Vitamins-Minerals (MULTIVITAMIN WITH MINERALS) tablet Take 1 tablet by mouth at bedtime.     . nitroGLYCERIN (NITROSTAT) 0.4 MG SL tablet Place 1 tablet (0.4 mg total) under the tongue every 5 (five) minutes as needed for chest pain. 25 tablet 2  . pantoprazole (PROTONIX) 40 MG tablet TAKE 1 TABLET DAILY 30 TO  60 MINUTES BEFORE FIRST    MEAL OF THE DAY 90 tablet 3  . PAZEO 0.7 % SOLN Place 1 drop  into both eyes daily as needed (allergies).   4  . rivaroxaban (XARELTO) 20 MG TABS tablet TAKE 1 TABLET DAILY WITH   SUPPER 90 tablet 1  . sildenafil (VIAGRA) 100 MG tablet Take 0.5-1 tablets (50-100 mg total) by mouth daily as needed for erectile dysfunction. 15 tablet 3   No current facility-administered medications for this visit.    Allergies:   Aleve [naproxen sodium], Oxycodone, Sulfa antibiotics, Trazodone and nefazodone, and Vicodin [hydrocodone-acetaminophen]   Social History:  The patient  reports that he quit smoking about 32 years ago. He has a 8.00 pack-year smoking history. He has never used smokeless tobacco. He reports that he does not drink alcohol and does not use drugs.   Family History:  The patient's family history includes Diabetes in his father and mother; Heart disease in his father and paternal grandfather.  ROS:  Please see the history of present illness.  All other systems are reviewed and otherwise negative.   PHYSICAL EXAM:  VS:  BP 138/68   Pulse 72   Ht 6\' 2"  (1.88 m)   Wt 207 lb (93.9 kg)   SpO2 97%   BMI 26.58 kg/m  BMI: Body mass index is 26.58 kg/m. Well nourished, well developed, in no acute distress  HEENT: normocephalic, atraumatic  Neck: no JVD, carotid bruits or masses Cardiac:  RRR; no significant murmurs, no rubs, or gallops Lungs:  CTA b/l, no wheezing, rhonchi or rales  Abd: soft, nontender MS: no deformity or atrophy Ext: no edema  Skin: warm and dry, no rash Neuro:  No gross deficits appreciated Psych: euthymic mood, full affect   EKG:  Done today and reviewed by myself:  SR 68bpm, normal intervals   12/29.2020: EPS/Ablation CONCLUSIONS: 1. Sinus rhythm upon presentation.   2. Intracardiac echo reveals a moderate sized left atrium with four separate pulmonary veins without evidence of pulmonary vein stenosis. 3. Successful electrical isolation and anatomical encircling of all four pulmonary veins with radiofrequency  current. 4. No inducible arrhythmias following ablation both on and off of Isuprel 5. No early apparent complications.    Cath 12/15/2017  Prox LAD to Mid LAD lesion is 20% stenosed.  The left ventricular systolic function is normal.  LV end diastolic pressure is normal.  The left ventricular ejection fraction is 55-65% by visual estimate.  No significant residual CAD with overlapping stents in the proximal to mid LAD with smooth intimal hyperplasia of 20%; normal ramus intermediate, dominant left circumflex, and nondominant RCA.  Normal LV function with an ejection fraction at 55 to 60% without focal segmental wall motion abnormalities.  RECOMMENDATION: Medical therapy with recurrent PAF, consider EP  evaluation for consideration of candidacy for ablation. Recommend to resume Rivaroxaban, at currently prescribed dose and frequency, on 12/16/2017. Concurrent antiplatelet therapy not recommended.    Echo 04/01/2019 IMPRESSIONS  1. Left ventricular ejection fraction, by visual estimation, is 60 to  65%. The left ventricle has normal function. There is no left ventricular  hypertrophy.  2. Global right ventricle has normal systolic function.The right  ventricular size is normal. No increase in right ventricular wall  thickness.  3. Left atrial size was normal.  4. Right atrial size was normal.  5. Trivial pericardial effusion is present.  6. The mitral valve is normal in structure. Trace mitral valve  regurgitation. No evidence of mitral stenosis.  7. The tricuspid valve is normal in structure. Tricuspid valve  regurgitation is not demonstrated.  8. The aortic valve is normal in structure. Aortic valve regurgitation is  not visualized. No evidence of aortic valve sclerosis or stenosis.  9. The pulmonic valve was normal in structure. Pulmonic valve  regurgitation is trivial.  10. TR signal is inadequate for assessing pulmonary artery systolic  pressure.  11.  The inferior vena cava is normal in size with <50% respiratory  variability, suggesting right atrial pressure of 8 mmHg.    Recent Labs: 03/07/2019: Magnesium 2.2 09/02/2019: ALT 16; BUN 14; Creatinine, Ser 1.13; Hemoglobin 14.5; Platelets 212; Potassium 3.8; Sodium 137  04/12/2019: Chol/HDL Ratio 2.9; Cholesterol, Total 123; HDL 42; LDL Chol Calc (NIH) 66; Triglycerides 73   CrCl cannot be calculated (Patient's most recent lab result is older than the maximum 21 days allowed.).   Wt Readings from Last 3 Encounters:  11/27/19 207 lb (93.9 kg)  09/16/19 206 lb 1.6 oz (93.5 kg)  09/10/19 200 lb (90.7 kg)     Other studies reviewed: Additional studies/records reviewed today include: summarized above  ASSESSMENT AND PLAN:  1. Persistent Afib     CHA2DS2Vasc is 3, on Xarelto, appropriately dosed     S/p PVI ablation Dec 2020      An hour of some palpitations last night, none otherwise He is very happy with his ablation result, should he have an increase in burden he will let us know,   2. CAD     No symptoms     On BB , statin, no ASA with his xarelto   3. HTN     No changes   Disposition: He sees cardiology in November, we will see him back in a year, sooner if needed    Current medicines are reviewed at length with the patient today.  The patient did not have any concerns regarding medicines.    Venetia Night, PA-C 11/27/2019 4:00 PM     [address] [place] [address] [phone] (office)  [phone] (fax)

## 2019-11-28 NOTE — Addendum Note (Signed)
Addended by: Claude Manges on: 11/28/2019 04:45 PM   Modules accepted: Orders

## 2019-12-10 IMAGING — CR DG HIP (WITH OR WITHOUT PELVIS) 2-3V LEFT
3 series · 3 of 3 positions shown · non-contrast
Comparison: CT Abdomen and Pelvis 02/09/2015.

CLINICAL DATA: 57-year-old male status post fall this morning down
stairs. Left lateral hip hematoma, on Xarelto.

EXAM:
DG HIP (WITH OR WITHOUT PELVIS) 2-3V LEFT

[t hip ap left (1 of 2)]
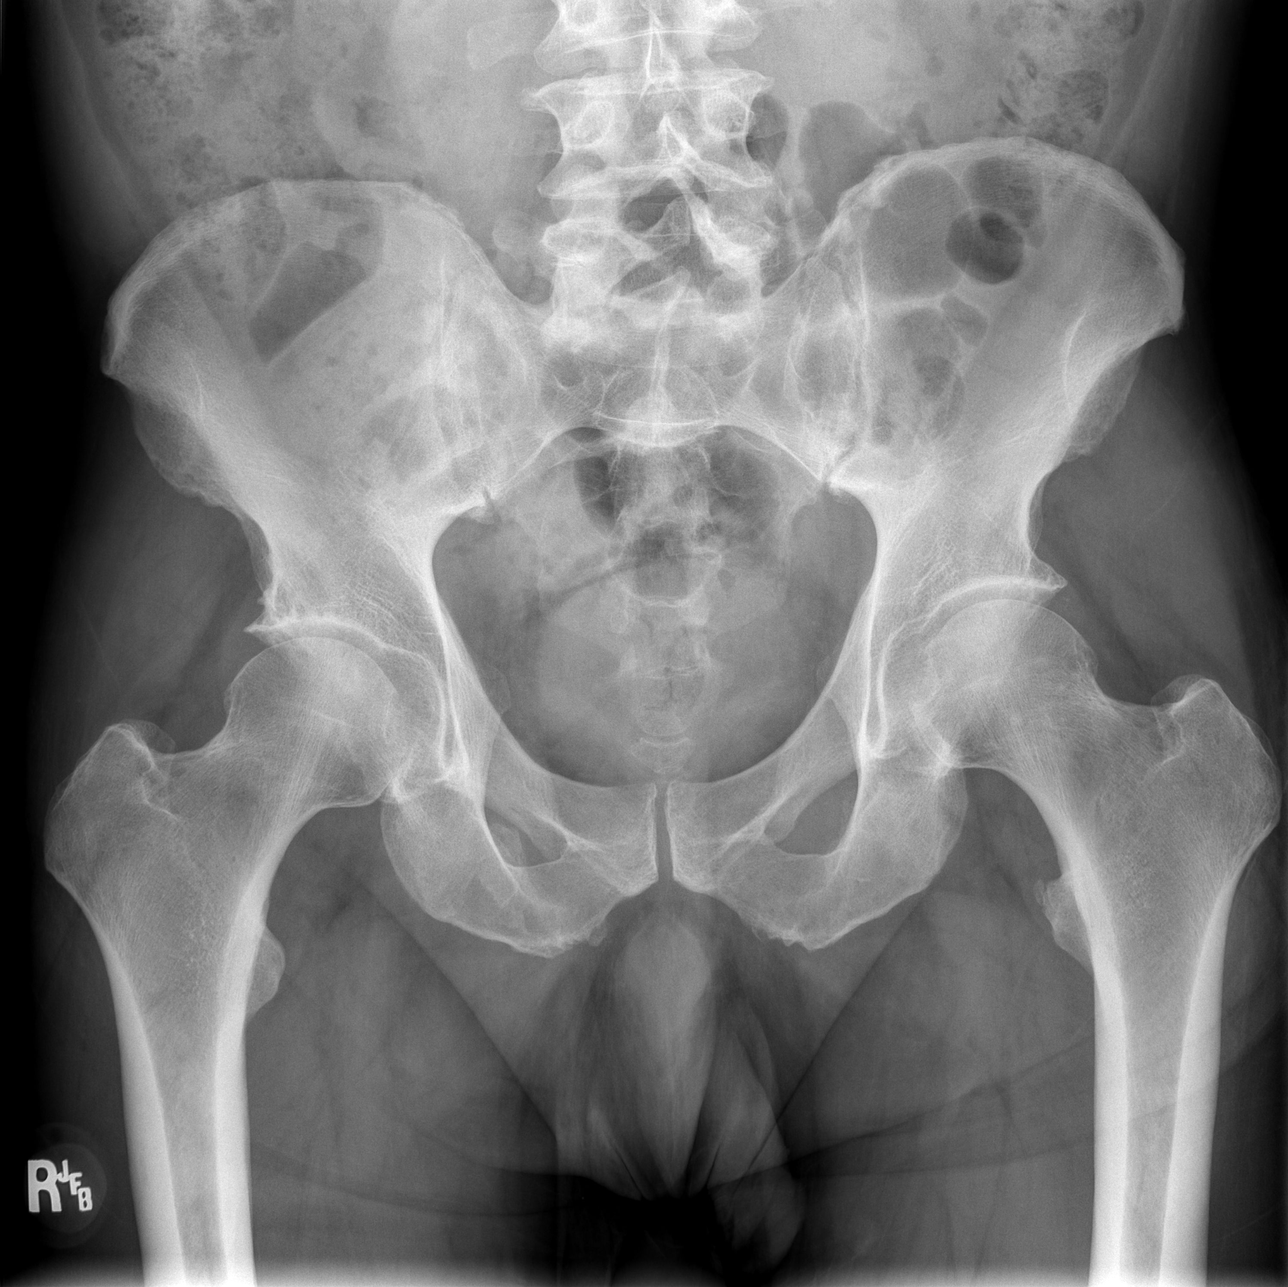

[t hip frog leg left]
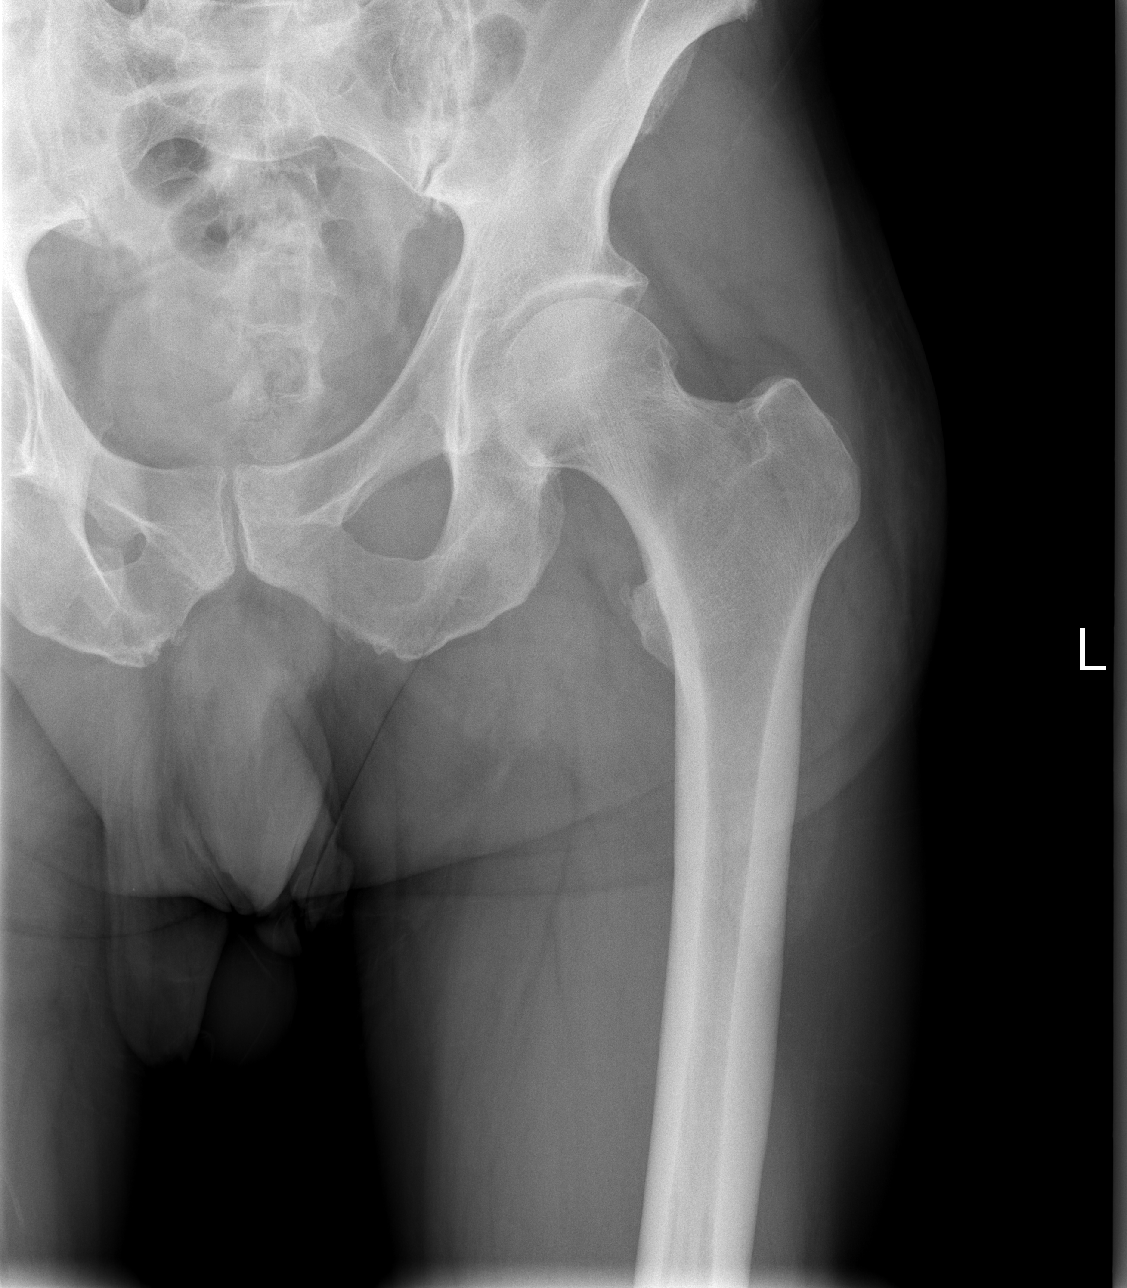

[t hip ap left (2 of 2)]
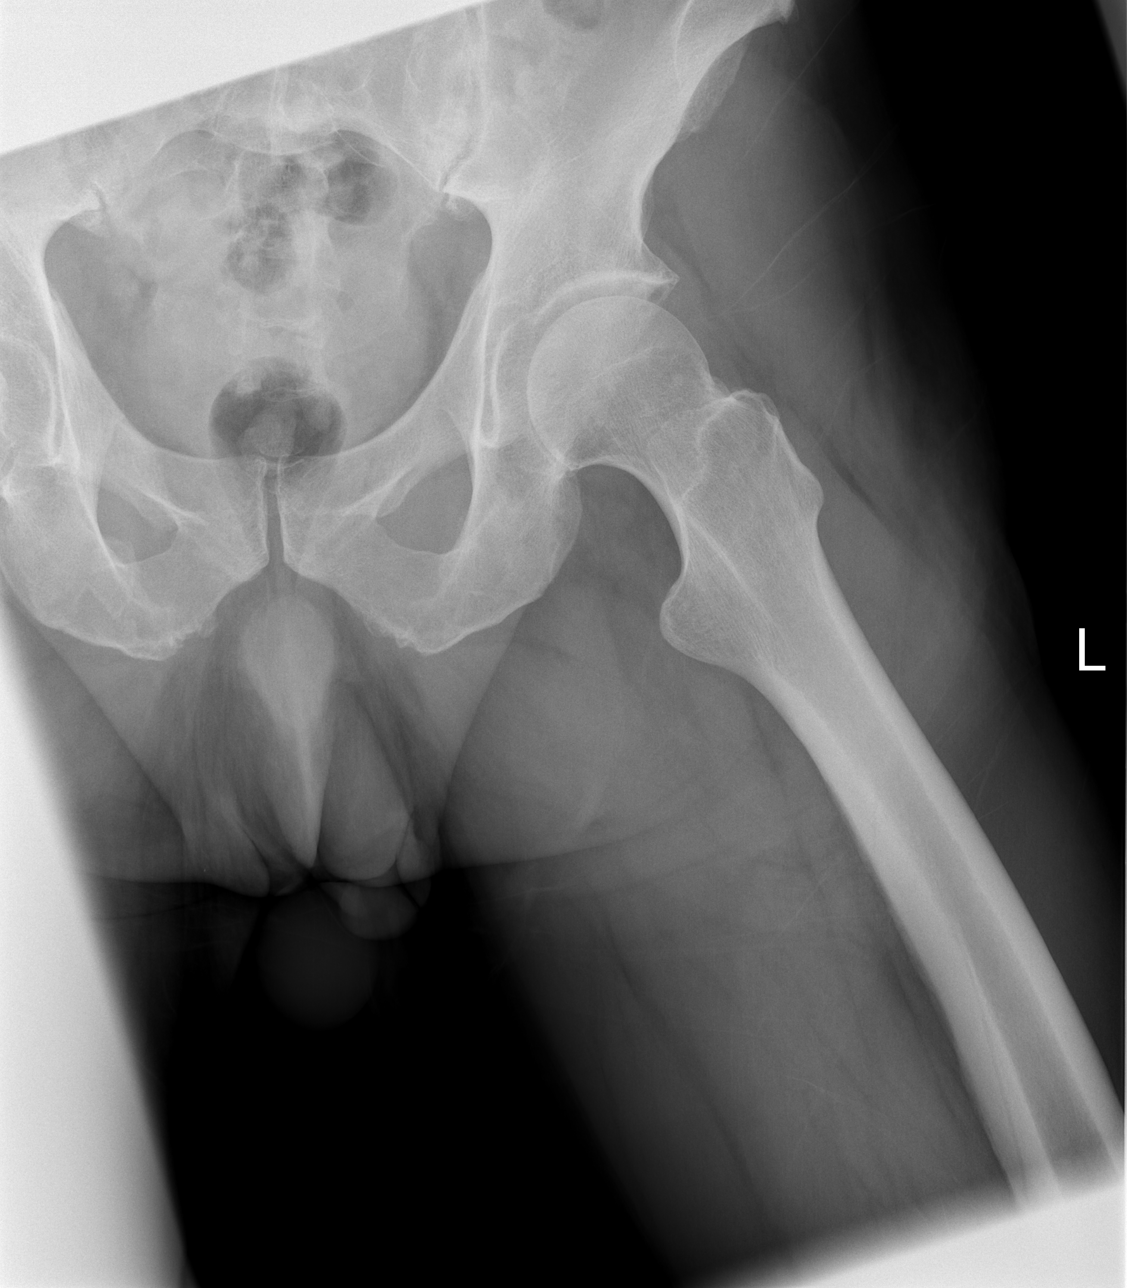

[3 of 3 positions shown; findings below may reference images not displayed]

FINDINGS: Femoral heads are normally located. There is mild asymmetric hip
joint space loss on the right. Asymmetric acetabular spurring
greater on the right. Proximal left femur is intact. No pelvis
fracture identified. Grossly intact proximal right femur. Negative
visible lower abdominal and pelvic visceral contours.
IMPRESSION: 1. No acute fracture or dislocation identified about the left hip or
pelvis.
2. Asymmetric right hip joint degeneration.

## 2019-12-24 ENCOUNTER — Other Ambulatory Visit: Payer: Self-pay

## 2019-12-24 MED ORDER — RIVAROXABAN 20 MG PO TABS: ORAL_TABLET | ORAL | 2 refills | Status: DC

## 2020-01-22 ENCOUNTER — Other Ambulatory Visit: Payer: Self-pay | Admitting: Family Medicine

## 2020-02-18 ENCOUNTER — Other Ambulatory Visit: Payer: Self-pay

## 2020-02-18 ENCOUNTER — Ambulatory Visit (INDEPENDENT_AMBULATORY_CARE_PROVIDER_SITE_OTHER): Payer: BC Managed Care – PPO | Admitting: Family Medicine

## 2020-02-18 VITALS — BP 122/80 | HR 58 | Temp 97.8°F | Ht 74.0 in | Wt 212.9 lb

## 2020-02-18 DIAGNOSIS — Z125 Encounter for screening for malignant neoplasm of prostate: Secondary | ICD-10-CM

## 2020-02-18 DIAGNOSIS — E119 Type 2 diabetes mellitus without complications: Secondary | ICD-10-CM | POA: Diagnosis not present

## 2020-02-18 DIAGNOSIS — Z79899 Other long term (current) drug therapy: Secondary | ICD-10-CM | POA: Diagnosis not present

## 2020-02-18 DIAGNOSIS — Z23 Encounter for immunization: Secondary | ICD-10-CM

## 2020-02-18 DIAGNOSIS — E785 Hyperlipidemia, unspecified: Secondary | ICD-10-CM | POA: Diagnosis not present

## 2020-02-18 NOTE — Progress Notes (Signed)
Established Patient Office Visit  Subjective:  Patient ID: Michael Livingston, male    DOB: Jan 17, 1961  Age: 59 y.o. MRN: [id number]  CC:  Chief Complaint  Patient presents with  . Annual Exam    HPI Michael Livingston presents for medical follow-up.  He has history of CAD, atrial fibrillation, past history of pulmonary sarcoidosis, type 2 diabetes, dyslipidemia.  He is on several medications including Xarelto for his A. fib.  Followed closely by cardiology.  No recent chest pains.  He had some recent issues with low libido.  He had testosterone last year was 354.  He was seen by endocrinology and given reassurance.  He feels his relationship is fairly good currently with his wife.  He does not complain of any major fatigue issues.  No major stress issues.  Does not take any serotonin medications or other medications that would likely adversely impact libido.  Type 2 diabetes.  This has been well controlled.  A1c last May was 5.7%.  He is due for follow-up labs.  He takes Vytorin for hyperlipidemia but has been scaling back to half tablet daily.  Remains on Metformin for his type 2 diabetes.  He has received Covid vaccine but does not have dates with him.  No history of shingles vaccine.  Flu vaccine received today.  He has eye exam scheduled  Past Medical History:  Diagnosis Date  . Allergy   . Atrial fibrillation ([place])    a. diagnosed in 01/2017 --> s/p DCCV. Started on Xarelto for anticoagulation.   [place] CAD (coronary artery disease)    a. s/p DES to LAD in 2005 b. patent by cath in 2007 with 30% LCx stenosis noted at that time c. normal NST in 05/2011  . Chronic kidney disease    1 kidney stone  . Diabetes mellitus without complication (HCC)    type 2, no meds needed  . Hyperlipidemia   . Hypertension   . Myocardial infarction [facility])     Past Surgical History:  Procedure Laterality Date  . ATRIAL FIBRILLATION ABLATION N/A 05/07/2019   Procedure: ATRIAL FIBRILLATION ABLATION;   Surgeon: Thompson Grayer, MD;  Location: [facility];  Service: Cardiovascular;  Laterality: N/A;  . CARDIAC SURGERY  2005   stent  . CORONARY STENT PLACEMENT    . KNEE ARTHROSCOPY     left 2003, right 2005  . LEFT HEART CATH AND CORONARY ANGIOGRAPHY N/A 12/15/2017   Procedure: LEFT HEART CATH AND CORONARY ANGIOGRAPHY;  Surgeon: Troy Sine, MD;  Location: [facility];  Service: Cardiovascular;  Laterality: N/A;  . SHOULDER SURGERY      Family History  Problem Relation Age of Onset  . Diabetes Mother   . Heart disease Father   . Diabetes Father   . Heart disease Paternal Grandfather   . Colon cancer Neg Hx   . Esophageal cancer Neg Hx   . Stomach cancer Neg Hx   . Rectal cancer Neg Hx     Social History   Socioeconomic History  . Marital status: Married    Spouse name: Not on file  . Number of children: Not on file  . Years of education: Not on file  . Highest education level: Not on file  Occupational History  . Not on file  Tobacco Use  . Smoking status: Former Smoker    Packs/day: 1.00    Years: 8.00    Pack years: 8.00    Quit date: 07/17/1987  Years since quitting: 32.6  . Smokeless tobacco: Never Used  Vaping Use  . Vaping Use: Never used  Substance and Sexual Activity  . Alcohol use: No    Alcohol/week: 0.0 standard drinks  . Drug use: No  . Sexual activity: Not on file  Other Topics Concern  . Not on file  Social History Narrative  . Not on file   Social Determinants of Health   Financial Resource Strain:   . Difficulty of Paying Living Expenses: Not on file  Food Insecurity:   . Worried About Charity fundraiser in the Last Year: Not on file  . Ran Out of Food in the Last Year: Not on file  Transportation Needs:   . Lack of Transportation (Medical): Not on file  . Lack of Transportation (Non-Medical): Not on file  Physical Activity:   . Days of Exercise per Week: Not on file  . Minutes of Exercise per Session: Not on file   Stress:   . Feeling of Stress : Not on file  Social Connections:   . Frequency of Communication with Friends and Family: Not on file  . Frequency of Social Gatherings with Friends and Family: Not on file  . Attends Religious Services: Not on file  . Active Member of Clubs or Organizations: Not on file  . Attends Archivist Meetings: Not on file  . Marital Status: Not on file  Intimate Partner Violence:   . Fear of Current or Ex-Partner: Not on file  . Emotionally Abused: Not on file  . Physically Abused: Not on file  . Sexually Abused: Not on file    Outpatient Medications Prior to Visit  Medication Sig Dispense Refill  . ezetimibe-simvastatin (VYTORIN) 10-20 MG tablet Take 1 tablet by mouth daily. (Patient taking differently: Take 0.5 tablets by mouth daily. Take half tablet by mouth daily) 90 tablet 1  . famotidine (PEPCID) 20 MG tablet Take 1 tablet (20 mg total) by mouth daily. 90 tablet 3  . lisinopril (ZESTRIL) 2.5 MG tablet Take 1 tablet (2.5 mg total) by mouth daily. 90 tablet 3  . metFORMIN (GLUCOPHAGE) 500 MG tablet TAKE 1 TABLET TWICE DAILY  WITH MEALS 180 tablet 0  . metoprolol succinate (TOPROL XL) 25 MG 24 hr tablet Take 1 tablet (25 mg total) by mouth daily. 90 tablet 2  . Multiple Vitamins-Minerals (MULTIVITAMIN WITH MINERALS) tablet Take 1 tablet by mouth at bedtime.     . nitroGLYCERIN (NITROSTAT) 0.4 MG SL tablet Place 1 tablet (0.4 mg total) under the tongue every 5 (five) minutes as needed for chest pain. 25 tablet 2  . pantoprazole (PROTONIX) 40 MG tablet TAKE 1 TABLET DAILY 30 TO  60 MINUTES BEFORE FIRST    MEAL OF THE DAY 90 tablet 3  . PAZEO 0.7 % SOLN Place 1 drop into both eyes daily as needed (allergies).   4  . rivaroxaban (XARELTO) 20 MG TABS tablet TAKE 1 TABLET DAILY WITH   SUPPER 90 tablet 2  . sildenafil (VIAGRA) 100 MG tablet Take 0.5-1 tablets (50-100 mg total) by mouth daily as needed for erectile dysfunction. 15 tablet 3  . diltiazem  (CARDIZEM) 30 MG tablet Take 1 tablet (30 mg total) by mouth daily as needed. 90 tablet 3   No facility-administered medications prior to visit.    Allergies  Allergen Reactions  . Aleve [Naproxen Sodium] Swelling and Other (See Comments)    Mouth swells  . Oxycodone Other (See Comments)  Rendered the patient unable to fall asleep AND he "almost passed out"  . Sulfa Antibiotics Hives  . Trazodone And Nefazodone Other (See Comments)    Seeing colored lights  . Vicodin [Hydrocodone-Acetaminophen] Other (See Comments)    Rendered the patient unable to fall asleep AND he "almost passed out"    ROS Review of Systems  Constitutional: Negative for appetite change, chills, fatigue, fever and unexpected weight change.  Eyes: Negative for visual disturbance.  Respiratory: Negative for cough, chest tightness and shortness of breath.   Cardiovascular: Negative for chest pain, palpitations and leg swelling.  Gastrointestinal: Negative for abdominal pain.  Endocrine: Negative for polydipsia and polyuria.  Genitourinary: Negative for dysuria.  Neurological: Negative for dizziness, syncope, weakness, light-headedness and headaches.  Hematological: Negative for adenopathy.      Objective:    Physical Exam Vitals reviewed.  Constitutional:      Appearance: Normal appearance.  Cardiovascular:     Rate and Rhythm: Normal rate and regular rhythm.  Pulmonary:     Effort: Pulmonary effort is normal.     Breath sounds: Normal breath sounds.  Abdominal:     Palpations: Abdomen is soft.     Tenderness: There is no abdominal tenderness.     Hernia: No hernia is present.  Musculoskeletal:     Cervical back: Neck supple.     Right lower leg: No edema.     Left lower leg: No edema.  Lymphadenopathy:     Cervical: No cervical adenopathy.  Neurological:     Mental Status: He is alert.     BP 122/80   Pulse (!) 58   Temp 97.8 F (36.6 C)   Ht 6\' 2"  (1.88 m)   Wt 212 lb 14.4 oz (96.6  kg)   SpO2 97%   BMI 27.33 kg/m  Wt Readings from Last 3 Encounters:  02/18/20 212 lb 14.4 oz (96.6 kg)  11/27/19 207 lb (93.9 kg)  09/16/19 206 lb 1.6 oz (93.5 kg)     Health Maintenance Due  Topic Date Due  . OPHTHALMOLOGY EXAM  01/17/2019    There are no preventive care reminders to display for this patient.  Lab Results  Component Value Date   TSH 1.71 12/05/2017   Lab Results  Component Value Date   WBC 8.7 09/02/2019   HGB 14.5 09/02/2019   HCT 42.1 09/02/2019   MCV 92.3 09/02/2019   PLT 212 09/02/2019   Lab Results  Component Value Date   NA 137 09/02/2019   K 3.8 09/02/2019   CO2 27 09/02/2019   GLUCOSE 114 (H) 09/02/2019   BUN 14 09/02/2019   CREATININE 1.13 09/02/2019   BILITOT 0.5 09/02/2019   ALKPHOS 58 09/02/2019   AST 19 09/02/2019   ALT 16 09/02/2019   PROT 8.0 09/02/2019   ALBUMIN 4.4 09/02/2019   CALCIUM 9.3 09/02/2019   ANIONGAP 10 09/02/2019   GFR 78.81 12/05/2017   Lab Results  Component Value Date   CHOL 123 04/12/2019   Lab Results  Component Value Date   HDL 42 04/12/2019   Lab Results  Component Value Date   LDLCALC 66 04/12/2019   Lab Results  Component Value Date   TRIG 73 04/12/2019   Lab Results  Component Value Date   CHOLHDL 2.9 04/12/2019   Lab Results  Component Value Date   HGBA1C 5.7 (A) 09/16/2019      Assessment & Plan:   #1 type 2 diabetes.  History of good control -  Recheck A1c today -Continue Metformin -Eye exam overdue but scheduled  #2 hypertension stable and at goal  #3 chronic anticoagulation secondary to A. fib history.  Patient on Xarelto -Check CBC  #4 dyslipidemia.  History of CAD.  Goal LDL less than 70 -Recheck fasting lipid panel and CMP  #5 low libido.  Previous testosterone level 354.  Discussed other factors affecting libido such as stress and inadequate rest  #6 health maintenance.  Covid vaccine already given.  Flu vaccine given today.  We discussed shingles vaccine he will  check on insurance coverage and consider.  He has not had recent PSA we discussed PSA screening.  Colonoscopy up-to-date.   No orders of the defined types were placed in this encounter.   Follow-up: No follow-ups on file.    Carolann Littler, MD

## 2020-02-18 NOTE — Patient Instructions (Addendum)
     If you have lab work done today you will be contacted with your lab results within the next 2 weeks.  If you have not heard from [place] then please contact us. The fastest way to get your results is to register for My Chart.   IF you received an x-ray today, you will receive an invoice from [facility]. Please contact [facility] at [phone] with questions or concerns regarding your invoice.   IF you received labwork today, you will receive an invoice from [facility]. Please contact [facility] at [phone] with questions or concerns regarding your invoice.   Our billing staff will not be able to assist you with questions regarding bills from these companies.  You will be contacted with the lab results as soon as they are available. The fastest way to get your results is to activate your My Chart account. Instructions are located on the last page of this paperwork. If you have not heard from [place] regarding the results in 2 weeks, please contact this office.    Consider Shingles vaccine (Shingrix) and let us know if interested after checking with insurance.

## 2020-02-19 ENCOUNTER — Encounter: Payer: Self-pay | Admitting: Family Medicine

## 2020-02-19 LAB — COMPLETE METABOLIC PANEL WITH GFR
AG Ratio: 1.5 (calc) (ref 1.0–2.5)
ALT: 14 U/L (ref 9–46)
AST: 18 U/L (ref 10–35)
Albumin: 4.6 g/dL (ref 3.6–5.1)
Alkaline phosphatase (APISO): 52 U/L (ref 35–144)
BUN: 18 mg/dL (ref 7–25)
CO2: 26 mmol/L (ref 20–32)
Calcium: 9.8 mg/dL (ref 8.6–10.3)
Chloride: 102 mmol/L (ref 98–110)
Creat: 0.83 mg/dL (ref 0.70–1.33)
GFR, Est African American: 113 mL/min/{1.73_m2} (ref >=60)
GFR, Est Non African American: 98 mL/min/{1.73_m2} (ref >=60)
Globulin: 3.1 g/dL (calc) (ref 1.9–3.7)
Glucose, Bld: 104 mg/dL — ABNORMAL HIGH (ref 65–99)
Potassium: 4.8 mmol/L (ref 3.5–5.3)
Sodium: 138 mmol/L (ref 135–146)
Total Bilirubin: 0.8 mg/dL (ref 0.2–1.2)
Total Protein: 7.7 g/dL (ref 6.1–8.1)

## 2020-02-19 LAB — CBC WITH DIFFERENTIAL/PLATELET
Absolute Monocytes: 560 cells/uL (ref 200–950)
Basophils Absolute: 50 cells/uL (ref 0–200)
Basophils Relative: 0.9 %
Eosinophils Absolute: 140 cells/uL (ref 15–500)
Eosinophils Relative: 2.5 %
HCT: 43.7 % (ref 38.5–50.0)
Hemoglobin: 15 g/dL (ref 13.2–17.1)
Lymphs Abs: 1467 cells/uL (ref 850–3900)
MCH: 31.8 pg (ref 27.0–33.0)
MCHC: 34.3 g/dL (ref 32.0–36.0)
MCV: 92.8 fL (ref 80.0–100.0)
MPV: 9.6 fL (ref 7.5–12.5)
Monocytes Relative: 10 %
Neutro Abs: 3382 cells/uL (ref 1500–7800)
Neutrophils Relative %: 60.4 %
Platelets: 234 10*3/uL (ref 140–400)
RBC: 4.71 10*6/uL (ref 4.20–5.80)
RDW: 12 % (ref 11.0–15.0)
Total Lymphocyte: 26.2 %
WBC: 5.6 10*3/uL (ref 3.8–10.8)

## 2020-02-19 LAB — LIPID PANEL
Cholesterol: 141 mg/dL (ref <200)
HDL: 45 mg/dL (ref >=40)
LDL Cholesterol (Calc): 78 mg/dL (calc)
Non-HDL Cholesterol (Calc): 96 mg/dL (calc) (ref <130)
Total CHOL/HDL Ratio: 3.1 (calc) (ref <5.0)
Triglycerides: 94 mg/dL (ref <150)

## 2020-02-19 LAB — HEMOGLOBIN A1C
Hgb A1c MFr Bld: 5.9 % of total Hgb — ABNORMAL HIGH (ref <5.7)
Mean Plasma Glucose: 123 (calc)
eAG (mmol/L): 6.8 (calc)

## 2020-02-19 LAB — PSA: PSA: 0.26 ng/mL (ref <=4.0)

## 2020-03-24 ENCOUNTER — Encounter: Payer: Self-pay | Admitting: Cardiovascular Disease

## 2020-03-24 ENCOUNTER — Ambulatory Visit: Payer: BC Managed Care – PPO | Admitting: Cardiovascular Disease

## 2020-03-24 ENCOUNTER — Other Ambulatory Visit: Payer: Self-pay

## 2020-03-24 DIAGNOSIS — I4819 Other persistent atrial fibrillation: Secondary | ICD-10-CM | POA: Diagnosis not present

## 2020-03-24 DIAGNOSIS — E785 Hyperlipidemia, unspecified: Secondary | ICD-10-CM | POA: Diagnosis not present

## 2020-03-24 DIAGNOSIS — I2510 Atherosclerotic heart disease of native coronary artery without angina pectoris: Secondary | ICD-10-CM

## 2020-03-24 DIAGNOSIS — E119 Type 2 diabetes mellitus without complications: Secondary | ICD-10-CM

## 2020-03-24 DIAGNOSIS — I10 Essential (primary) hypertension: Secondary | ICD-10-CM | POA: Diagnosis not present

## 2020-03-24 NOTE — Patient Instructions (Signed)
Medication Instructions:  Your physician recommends that you continue on your current medications as directed. Please refer to the Current Medication list given to you today.   *If you need a refill on your cardiac medications before your next appointment, please call your pharmacy*  Lab Work: NONE  Testing/Procedures: NONE  Follow-Up: At [facility], you and your health needs are our priority.  As part of our continuing mission to provide you with exceptional heart care, we have created designated Provider Care Teams.  These Care Teams include your primary Cardiologist (physician) and Advanced Practice Providers (APPs -  Physician Assistants and Nurse Practitioners) who all work together to provide you with the care you need, when you need it.  We recommend signing up for the patient portal called "MyChart".  Sign up information is provided on this After Visit Summary.  MyChart is used to connect with patients for Virtual Visits (Telemedicine).  Patients are able to view lab/test results, encounter notes, upcoming appointments, etc.  Non-urgent messages can be sent to your provider as well.   To learn more about what you can do with MyChart, go to [email or web address]://[email or web address].    Your next appointment:   12 month(s)  The format for your next appointment:   In Person  Provider:   You may see Thomas Kelly, MD or one of the following Advanced Practice Providers on your designated Care Team:    Hao Meng, PA-C  Angela Duke, PA-C or   Krista Kroeger, PA-C      

## 2020-03-24 NOTE — Progress Notes (Signed)
Patient ID: Michael Livingston, male   DOB: Sep 10, 1960, 59 y.o.   MRN: [id number]     HPI: Michael Livingston is a 59 y.o. male presents to the office for a 88-monthfollow-up cardiology evaluation.  Mr. WCihlarhas CAD and in October 2005 underwent stenting to his proximal to mid left anterior descending artery. His last catheterization was in October 2007 and his LAD was  was widely patent. He did have mild 30% circumflex stenosis. A nuclear perfusion study in January 2013 continued to show normal perfusion.  Michael Livingston a history of palpitations which have been controlled with beta blocker therapy. He has a history of hyperlipidemia with an atherogenic lipid panel with low HDL levels. An NMR lipoprofile in 2014 demonstrated increased insulin resistance and an increased wall LDL particles.  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 Since that time, he has lost over 20 pounds. He denies recent chest pain. He denies significant increase in palpitations except a rare episode.   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  He denies palpitations.  He denies PND, orthopnea.  Last year he underwent orthopedic surgery to his shoulder and tolerated this well.    He was started on metformin for diabetes mellitus.  In June 2017 hemoglobin A1c was 6.4, and most recently in January 2018  improved to 5.7.  I last saw him, he did note mild shortness of breath with arm raising and denied exertional chest pain or palpitations.  He was on aspirin and Plavix for dual antiplatelet therapy.  He was on ramipril 5 mg, metoprolol 25 mg twice a day for hypertension and Vytorin 1044.  Hyperlipidemia.  Heme notes mild shortness of breath when he raises his arms.  He denies exertional chest pain symptoms or exertional dyspnea.  He continues to be on  aspirin and Plavix for dual antiplatelet therapy.  He continues to be on ram a pro-5 mg, metoprolol 25 mg twice a day for hypertension.  He is on Vytorin 10/40 for hyperlipidemia, as well as lovaza.  Since I last saw him, he developed new onset atrial fibrillation on 01/19/2017.  [facility]  CBC be met were normal as was his TSH.  He underwent successful DC cardioversion at 150 J with restoration of sinus rhythm.  His Lopressor dose was increased to 30 mg twice a day, and he was started on Xarelto 20 mg for a cha2ds2score of 3 (CAD, hypertension, diabetes mellitus.  His Plavix was discontinued.  He saw BBernerd Phofor office follow-up.  He was doing well without recurrence.  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  The month previously, he had significantly banged his head on a piling intrahepatic and as result was very concerned about work-related trauma, particularly while being on Xarelto. He was unaware of any recurrent episodes of arrhythmia.  We had a lengthy discussion about risk-benefit ratio of anticoagulation, particularly with his job.  He preferred not continue Xarelto due to his job risk.  As result, Xarelto was discontinued and he was resumed back on aspirin 81 mg and Plavix 75 mg. I increased metoprolol recommended he continue to take lisinopril 20 mg daily.  If he  were to develop any recurrent episodes of AFeinitiation of anticoagulation would be necessary.  When I saw him in the office in March 2019 he apparently was only taking metoprolol tartrate once a day.  As result, I recommended switching him to metoprolol succinate to allow for improved sustained action.  He had been doing well was unaware of any arrhythmias until October 10, 2017.  It was extremely hot, he was working on a roof, he was dehydrated, and he noticed his heart rate speeding up.  He quickly got off the roof.  He was seen by  Dr. Laurance Flatten and presented to [facility] ER where he was found to be in AF with ventricular rate in the 150s to 160s.  I saw him in the emergency room and in the ER he underwent successful cardioversion with restoration of sinus rhythm.  At that time he was advised to increase metoprolol succinate to 37.5 mg and instead of taking the medication at bedtime to take it in the morning.  He was also given a prescription for short acting diltiazem 30 mg to take on a as needed basis.  I saw him in the office 1 week later on October 18, 2017 at which time he was doing well and was not experiencing any recurrent arrhythmia.    He was seen as an add-on in the office on December 11, 2017 by Fabian Sharp, [place].  At that time he had complaints of shortness of breath with activity as well as vague chest fullness and tightness with minimal work.  He was set up for repeat cardiac catheterization by me on December 15, 2017.  This did not reveal any significant residual CAD and he had overlapping stents in the proximal to mid LAD with smooth intimal hyperplasia of 20%.  He had a normal ramus intermediate, dominant left circumflex nondominant RCA.  Medical therapy was recommended and if he had recurrent PAF consider potential candidacy for ablation.  He was restarted back on Xarelto.  He was seen for hospital follow-up with Fabian Sharp, [place] on January 02, 2018 and he was doing well.  He has occasional chest pain twinges had largely resolved with Protonix.  I last saw him on July 26, 2018 at which time he was feeling well and denied any recurrent episodes of arrhythmia.  He had previously been evaluated on  on June 18, 2018 by Jory Sims, nurse practitioner because of concerns for possible blood pressure elevation with monitoring of his home blood pressure unit.  Lisinopril was initiated.  During my evaluation he was on lisinopril 5 mg, metoprolol succinate 37.5 mg daily and has a prescription for diltiazem 30 mg to take as needed if recurrent  palpitations.  He  not required any additional diltiazem supplementation.  He continued to take Protonix for GERD.  He is on Xarelto for anticoagulation.  He continued to be on Vytorin 10/40 for hyperlipidemia.    Michael Livingston states that he had done fairly well without recurrent arrhythmia until March 07, 2019.  On that day, he had taken his father to our office and he had a syncopal spell outside while waiting for the elevator.  As result, only had to take his father to the emergency room and ultimately he required a permanent pacemaker.  However later that night upon coming home from the hospital he was eating dinner late and developed recurrent tachycardia.  He had taken diltiazem 30 mg x 2 without benefit and ultimately presented to the emergency room where again  he was found to be in atrial fibrillation.  Since he was anticoagulated on Xarelto he underwent another successful cardioversion in the emergency room with restoration of sinus rhythm.  He denied any associated anginal symptomatology.  He has continued to take metoprolol succinate 37.5 mg daily, lisinopril 2.5 mg, and is on ezetimibe simvastatin 10/20.  He continues to be on Xarelto 20 mg without bleeding.    At his last evaluation with me in November 2020 I referred him to Dr. Rayann Heman for consideration of ablation and on May 07, 2019 underwent atrial fibrillation ablation with successful electrical isolation and anatomical encircling of all 4 pulmonary veins.  He was seen by Almyra Deforest, PA in follow-up in May 2021 which time he continued to do well.  He continued on very low-dose metoprolol succinate 25 mg as well as Xarelto.  He was not having any chest pain.  He was seen by Tommye Standard in EP clinic in July.  He was stable with reference to his prior ablation.  Presently, he feels well.  He is unaware of any recurrent atrial fibrillation or palpitations.  He is not going up on the roof as much as he had in the past.  Remotely palpitations  would occur during periods of increased stress associated with dehydration.  He denies any chest pain PND orthopnea.  He is on metoprolol succinate 25 mg daily and lisinopril 2.5 mg for hypertension and his PAF.  He is on Vytorin 10/20 for hyperlipidemia.  He is on Metformin for type 2 diabetes mellitus.  He continues to be on Xarelto 20 mg without bleeding.  He presents for evaluation..  Past Medical History:  Diagnosis Date  . Allergy   . Atrial fibrillation ([place])    a. diagnosed in 01/2017 --> s/p DCCV. Started on Xarelto for anticoagulation.   Marland Kitchen CAD (coronary artery disease)    a. s/p DES to LAD in 2005 b. patent by cath in 2007 with 30% LCx stenosis noted at that time c. normal NST in 05/2011  . Chronic kidney disease    1 kidney stone  . Diabetes mellitus without complication (HCC)    type 2, no meds needed  . Hyperlipidemia   . Hypertension   . Myocardial infarction [facility])     Past Surgical History:  Procedure Laterality Date  . ATRIAL FIBRILLATION ABLATION N/A 05/07/2019   Procedure: ATRIAL FIBRILLATION ABLATION;  Surgeon: Thompson Grayer, MD;  Location: [place] [facility];  Service: Cardiovascular;  Laterality: N/A;  . CARDIAC SURGERY  2005   stent  . CORONARY STENT PLACEMENT    . KNEE ARTHROSCOPY     left 2003, right 2005  . LEFT HEART CATH AND CORONARY ANGIOGRAPHY N/A 12/15/2017   Procedure: LEFT HEART CATH AND CORONARY ANGIOGRAPHY;  Surgeon: Troy Sine, MD;  Location: [place] [facility];  Service: Cardiovascular;  Laterality: N/A;  . SHOULDER SURGERY      Allergies  Allergen Reactions  . Aleve [Naproxen Sodium] Swelling and Other (See Comments)    Mouth swells  . Oxycodone Other (See Comments)    Rendered the patient unable to fall asleep AND he "almost passed out"  . Sulfa Antibiotics Hives  . Trazodone And Nefazodone Other (See Comments)    Seeing colored lights  . Vicodin [Hydrocodone-Acetaminophen] Other (See Comments)    Rendered the patient unable to fall  asleep AND he "almost passed out"    Current Outpatient Medications  Medication Sig Dispense Refill  . ezetimibe-simvastatin (VYTORIN) 10-20  MG tablet Take 1 tablet by mouth daily. (Patient taking differently: Take 0.5 tablets by mouth daily. Take half tablet by mouth daily) 90 tablet 1  . famotidine (PEPCID) 20 MG tablet Take 1 tablet (20 mg total) by mouth daily. 90 tablet 3  . lisinopril (ZESTRIL) 2.5 MG tablet Take 1 tablet (2.5 mg total) by mouth daily. 90 tablet 3  . metFORMIN (GLUCOPHAGE) 500 MG tablet TAKE 1 TABLET TWICE DAILY  WITH MEALS 180 tablet 0  . metoprolol succinate (TOPROL XL) 25 MG 24 hr tablet Take 1 tablet (25 mg total) by mouth daily. 90 tablet 2  . Multiple Vitamins-Minerals (MULTIVITAMIN WITH MINERALS) tablet Take 1 tablet by mouth at bedtime.     . nitroGLYCERIN (NITROSTAT) 0.4 MG SL tablet Place 1 tablet (0.4 mg total) under the tongue every 5 (five) minutes as needed for chest pain. 25 tablet 2  . pantoprazole (PROTONIX) 40 MG tablet TAKE 1 TABLET DAILY 30 TO  60 MINUTES BEFORE FIRST    MEAL OF THE DAY 90 tablet 3  . PAZEO 0.7 % SOLN Place 1 drop into both eyes daily as needed (allergies).   4  . rivaroxaban (XARELTO) 20 MG TABS tablet TAKE 1 TABLET DAILY WITH   SUPPER 90 tablet 2  . sildenafil (VIAGRA) 100 MG tablet Take 0.5-1 tablets (50-100 mg total) by mouth daily as needed for erectile dysfunction. 15 tablet 3  . diltiazem (CARDIZEM) 30 MG tablet Take 1 tablet (30 mg total) by mouth daily as needed. 90 tablet 3   No current facility-administered medications for this visit.    Social History   Socioeconomic History  . Marital status: Married    Spouse name: Not on file  . Number of children: Not on file  . Years of education: Not on file  . Highest education level: Not on file  Occupational History  . Not on file  Tobacco Use  . Smoking status: Former Smoker    Packs/day: 1.00    Years: 8.00    Pack years: 8.00    Quit date: 07/17/1987    Years since  quitting: 32.7  . Smokeless tobacco: Never Used  Vaping Use  . Vaping Use: Never used  Substance and Sexual Activity  . Alcohol use: No    Alcohol/week: 0.0 standard drinks  . Drug use: No  . Sexual activity: Not on file  Other Topics Concern  . Not on file  Social History Narrative  . Not on file   Social Determinants of Health   Financial Resource Strain:   . Difficulty of Paying Living Expenses: Not on file  Food Insecurity:   . Worried About Charity fundraiser in the Last Year: Not on file  . Ran Out of Food in the Last Year: Not on file  Transportation Needs:   . Lack of Transportation (Medical): Not on file  . Lack of Transportation (Non-Medical): Not on file  Physical Activity:   . Days of Exercise per Week: Not on file  . Minutes of Exercise per Session: Not on file  Stress:   . Feeling of Stress : Not on file  Social Connections:   . Frequency of Communication with Friends and Family: Not on file  . Frequency of Social Gatherings with Friends and Family: Not on file  . Attends Religious Services: Not on file  . Active Member of Clubs or Organizations: Not on file  . Attends Archivist Meetings: Not on file  . Marital  Status: Not on file  Intimate Partner Violence:   . Fear of Current or Ex-Partner: Not on file  . Emotionally Abused: Not on file  . Physically Abused: Not on file  . Sexually Abused: Not on file    Family History  Problem Relation Age of Onset  . Diabetes Mother   . Heart disease Father   . Diabetes Father   . Heart disease Paternal Grandfather   . Colon cancer Neg Hx   . Esophageal cancer Neg Hx   . Stomach cancer Neg Hx   . Rectal cancer Neg Hx    Social history is notable that he is married and has 2 children. There is no tobacco or alcohol use. He has an autistic son.   ROS General: Negative; No fevers, chills, or night sweats; there is purposeful weight loss HEENT: Negative; No changes in vision or hearing, sinus  congestion, difficulty swallowing Pulmonary: Negative; No cough, wheezing, shortness of breath, hemoptysis Cardiovascular: See history of present illness:  GI: GERD GU: Negative; No dysuria, hematuria, or difficulty voiding Musculoskeletal: Left shoulder labrum tear Hematologic/Oncology: Negative; no easy bruising, bleeding Endocrine: Negative; no heat/cold intolerance; no diabetes Neuro: Negative; no changes in balance, headaches Skin: Negative; No rashes or skin lesions Psychiatric: Negative; No behavioral problems, depression Sleep: Negative; No snoring, daytime sleepiness, hypersomnolence, bruxism, restless legs, hypnogognic hallucinations, no cataplexy Other comprehensive 14 point system review is negative.   PE BP 126/70 (BP Location: Left Arm, Patient Position: Sitting)   Pulse 64   Ht _0  (1.88 m)   Wt 218 lb 3.2 oz (99 kg)   SpO2 98%   BMI 28.02 kg/m    Repeat blood pressure by me was 130/70  Wt Readings from Last 3 Encounters:  03/24/20 218 lb 3.2 oz (99 kg)  02/18/20 212 lb 14.4 oz (96.6 kg)  11/27/19 207 lb (93.9 kg)   General: Alert, oriented, no distress.  Skin: normal turgor, no rashes, warm and dry HEENT: Normocephalic, atraumatic. Pupils equal round and reactive to light; sclera anicteric; extraocular muscles intact;  Nose without nasal septal hypertrophy Mouth/Parynx benign; Mallinpatti scale 3 Neck: No JVD, no carotid bruits; normal carotid upstroke Lungs: clear to ausculatation and percussion; no wheezing or rales Chest wall: without tenderness to palpitation Heart: PMI not displaced, RRR, s1 s2 normal, 1/6 systolic murmur, no diastolic murmur, no rubs, gallops, thrills, or heaves Abdomen: soft, nontender; no hepatosplenomehaly, BS+; abdominal aorta nontender and not dilated by palpation. Back: no CVA tenderness Pulses 2+ Musculoskeletal: full range of motion, normal strength, no joint deformities Extremities: no clubbing cyanosis or edema, Homan's  sign negative  Neurologic: grossly nonfocal; Cranial nerves grossly wnl Psychologic: Normal mood and affect  ECG (independently read by me): Normal sinus rhythm at 64 bpm.  No ectopy.  Normal intervals.  QTc interval 414 ms.  November 2020 ECG (independently read by me): NSR at 60; Normal intervals  October 18, 2017 ECG (independently read by me): Sinus rhythm at 64 bpm.  Normal intervals.  No ectopy.  March 2018 ECG (independently read by me): normal sinus rhythm at 61 bpm.  Normal intervals.  No ectopy.  No ST segment changes.  December 2018 ECG (independently read by me):  Sinus bradycardia at 55 bpm.  PR interal 168 ms.  QTc inteval 417 ms. No ST-T changes.  February 2018 ECG (independently read by me): Sinus bradycardia at 48 bpm.  No ST segment changes.  Normal intervals.  June 2017 ECG (independently read by me):  Sinus bradycardia 58 bpm.  Normal intervals.  No ST segment changes.  September 2016 ECG (independently read by me): Sinus bradycardia 58 bpm.  Mild RV conduction delay.  September 2015 ECG:( Independently read by me): Sinus bradycardia 50 beats per minute.  No ST segment changes.  Normal intervals.   November 2014 ECG: Sinus bradycardia at 45 beats per minute. No significant ST-T change.  LABS: BMP Latest Ref Rng & Units 02/18/2020 09/02/2019 04/12/2019  Glucose 65 - 99 mg/dL 104(H) 114(H) 108(H)  BUN 7 - 25 mg/dL _0 Creatinine 0.70 - 1.33 mg/dL 0.83 1.13 0.94  BUN/Creat Ratio 6 - 22 (calc) NOT APPLICABLE - 15  Sodium 135 - 146 mmol/L 138 137 140  Potassium 3.5 - 5.3 mmol/L 4.8 3.8 4.9  Chloride 98 - 110 mmol/L 102 100 102  CO2 20 - 32 mmol/L _1 Calcium 8.6 - 10.3 mg/dL 9.8 9.3 9.5   Hepatic Function Latest Ref Rng & Units 02/18/2020 09/02/2019 04/12/2019  Total Protein 6.1 - 8.1 g/dL 7.7 8.0 7.4  Albumin 3.5 - 5.0 g/dL - 4.4 4.6  AST 10 - 35 U/L _2 ALT 9 - 46 U/L _3 Alk Phosphatase 38 - 126 U/L - 58 65  Total Bilirubin 0.2 - 1.2 mg/dL  0.8 0.5 0.9  Bilirubin, Direct 0.0 - 0.3 mg/dL - - -   CBC Latest Ref Rng & Units 02/18/2020 09/02/2019 04/12/2019  WBC 3.8 - 10.8 Thousand/uL 5.6 8.7 6.1  Hemoglobin 13.2 - 17.1 g/dL 15.0 14.5 15.5  Hematocrit 38 - 50 % 43.7 42.1 43.6  Platelets 140 - 400 Thousand/uL 234 212 215   Lab Results  Component Value Date   MCV 92.8 02/18/2020   MCV 92.3 09/02/2019   MCV 91 04/12/2019   Lab Results  Component Value Date   TSH 1.71 12/05/2017   Lab Results  Component Value Date   HGBA1C 5.9 (H) 02/18/2020   Lipid Panel     Component Value Date/Time   CHOL 141 02/18/2020 1023   CHOL 123 04/12/2019 0828   CHOL 99 04/02/2013 0903   TRIG 94 02/18/2020 1023   TRIG 59 04/02/2013 0903   HDL 45 02/18/2020 1023   HDL 42 04/12/2019 0828   HDL 42 04/02/2013 0903   CHOLHDL 3.1 02/18/2020 1023   VLDL 19.4 10/12/2018 0852   LDLCALC 78 02/18/2020 1023   LDLCALC 45 04/02/2013 0903     RADIOLOGY: No results found.  IMPRESSION:  1. Persistent atrial fibrillation [facility]): s/p successful atrial fibrillation ablation December 2020   2. Essential hypertension   3. CAD in native artery: Status post stenting to his proximal LAD October 2005.   4. Hyperlipidemia with target LDL less than 70   5. Controlled type 2 diabetes mellitus without complication, without long-term current use of insulin [facility])    ASSESSMENT AND PLAN: Michael Livingston is a 59 year old white male who is 16 years status post stenting to his proximal to mid LAD in October 2005.  Catheterization in October 2007 demonstrated a widely patent LAD stent and he had mild 30% narrowing.  A nuclear perfusion study in January 2013 continued to show normal perfusion.  He has not had any recurrent anginal symptomatology.  Since 2018 he has had episodes of recurrent paroxysmal atrial fibrillation and at times palpitations were precipitated during he and stress in the setting of dehydration.  Due to increased frequency of PAF, I ultimately  referred him  to Dr. Rayann Heman and he underwent successful atrial fibrillation ablation on May 07, 2019 by Dr. Rayann Heman.  Subsequently, he has been maintaining sinus rhythm.  He continues to be on low-dose metoprolol 25 mg daily in addition to lisinopril 2.5 mg for blood pressure and palpitations.  He continues to be on Xarelto for anticoagulation and is not having any bleeding.  He has not had any anginal symptomatology and is on Vytorin 10/20 for hyperlipidemia.  Lipid studies recently in October 2021 showed a total cholesterol 141, HDL 45, triglycerides 94, and LDL 78.  He has mild diabetes and most recent hemoglobin A1c was 5.9 on his Metformin 500 mg once a day.  He remains active on his land where he built his house and now has added some additions to his garage.  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  He will continue current therapy as prescribed.  I will see him in 1 year or sooner as needed.   03/29/2020 6:52 PM

## 2020-03-29 ENCOUNTER — Encounter: Payer: Self-pay | Admitting: Cardiovascular Disease

## 2020-03-31 ENCOUNTER — Other Ambulatory Visit: Payer: Self-pay | Admitting: Family Medicine

## 2020-04-24 ENCOUNTER — Telehealth: Payer: Self-pay | Admitting: Family Medicine

## 2020-04-24 NOTE — Telephone Encounter (Signed)
Pt is calling in stating that his life insurance company [facility]) has sent over some forms and he would like to see if they can be filled out and sent back in as soon as possible.  I have not seen the paperwork as of today in is folder.  Pt is aware of the 3-5 business day policy for any paperwork.

## 2020-05-10 ENCOUNTER — Other Ambulatory Visit: Payer: Self-pay | Admitting: Cardiovascular Disease

## 2020-05-19 ENCOUNTER — Other Ambulatory Visit: Payer: Self-pay | Admitting: Family Medicine

## 2020-05-28 ENCOUNTER — Other Ambulatory Visit: Payer: Self-pay | Admitting: Family Medicine

## 2020-06-11 NOTE — Telephone Encounter (Signed)
Pt called to let Arbie Cookey know that the insurance company have the pw and she does not need to be on the look out for it.

## 2020-08-26 ENCOUNTER — Other Ambulatory Visit: Payer: Self-pay | Admitting: Physician Assistant

## 2020-09-21 ENCOUNTER — Other Ambulatory Visit: Payer: Self-pay | Admitting: Cardiovascular Disease

## 2020-09-21 NOTE — Telephone Encounter (Signed)
48m, 99kg, scr 0.83 02/18/20, lovw/kelly 03/24/20, ccr 137.5

## 2020-10-26 ENCOUNTER — Other Ambulatory Visit: Payer: Self-pay | Admitting: Family Medicine

## 2020-11-27 ENCOUNTER — Other Ambulatory Visit: Payer: Self-pay

## 2020-11-27 ENCOUNTER — Telehealth: Payer: BC Managed Care – PPO | Admitting: Family Medicine

## 2020-11-30 ENCOUNTER — Ambulatory Visit: Payer: BC Managed Care – PPO | Admitting: Family Medicine

## 2020-12-02 ENCOUNTER — Encounter: Payer: Self-pay | Admitting: Family Medicine

## 2020-12-02 ENCOUNTER — Ambulatory Visit: Payer: BC Managed Care – PPO | Admitting: Family Medicine

## 2020-12-02 ENCOUNTER — Other Ambulatory Visit: Payer: Self-pay

## 2020-12-02 VITALS — BP 140/90 | HR 56 | Temp 97.7°F | Wt 213.8 lb

## 2020-12-02 DIAGNOSIS — E785 Hyperlipidemia, unspecified: Secondary | ICD-10-CM

## 2020-12-02 DIAGNOSIS — R0609 Other forms of dyspnea: Secondary | ICD-10-CM

## 2020-12-02 DIAGNOSIS — R0600 Dyspnea, unspecified: Secondary | ICD-10-CM

## 2020-12-02 DIAGNOSIS — K219 Gastro-esophageal reflux disease without esophagitis: Secondary | ICD-10-CM | POA: Diagnosis not present

## 2020-12-02 DIAGNOSIS — E119 Type 2 diabetes mellitus without complications: Secondary | ICD-10-CM

## 2020-12-02 LAB — BASIC METABOLIC PANEL
BUN: 18 mg/dL (ref 6–23)
CO2: 27 mEq/L (ref 19–32)
Calcium: 9.4 mg/dL (ref 8.4–10.5)
Chloride: 102 mEq/L (ref 96–112)
Creatinine, Ser: 0.99 mg/dL (ref 0.40–1.50)
GFR: 84.29 mL/min (ref >60.00)
Glucose, Bld: 108 mg/dL — ABNORMAL HIGH (ref 70–99)
Potassium: 4.3 mEq/L (ref 3.5–5.1)
Sodium: 138 mEq/L (ref 135–145)

## 2020-12-02 LAB — HEPATIC FUNCTION PANEL
ALT: 17 U/L (ref 0–53)
AST: 19 U/L (ref 0–37)
Albumin: 4.4 g/dL (ref 3.5–5.2)
Alkaline Phosphatase: 52 U/L (ref 39–117)
Bilirubin, Direct: 0.2 mg/dL (ref 0.0–0.3)
Total Bilirubin: 1 mg/dL (ref 0.2–1.2)
Total Protein: 7.5 g/dL (ref 6.0–8.3)

## 2020-12-02 LAB — POCT GLYCOSYLATED HEMOGLOBIN (HGB A1C): Hemoglobin A1C: 5.4 % (ref 4.0–5.6)

## 2020-12-02 LAB — LIPID PANEL
Cholesterol: 121 mg/dL (ref 0–200)
HDL: 34.8 mg/dL — ABNORMAL LOW (ref >39.00)
LDL Cholesterol: 58 mg/dL (ref 0–99)
NonHDL: 86.06
Total CHOL/HDL Ratio: 3
Triglycerides: 139 mg/dL (ref 0.0–149.0)
VLDL: 27.8 mg/dL (ref 0.0–40.0)

## 2020-12-02 MED ORDER — NITROGLYCERIN 0.4 MG SL SUBL: 0.4000 mg | SUBLINGUAL_TABLET | SUBLINGUAL | 2 refills | Status: AC | PRN

## 2020-12-02 MED ORDER — METOPROLOL SUCCINATE ER 25 MG PO TB24: 25.0000 mg | ORAL_TABLET | Freq: Every day | ORAL | 3 refills | Status: DC

## 2020-12-02 MED ORDER — PANTOPRAZOLE SODIUM 40 MG PO TBEC: DELAYED_RELEASE_TABLET | ORAL | 3 refills | Status: DC

## 2020-12-02 MED ORDER — LISINOPRIL 2.5 MG PO TABS: 2.5000 mg | ORAL_TABLET | Freq: Every day | ORAL | 1 refills | Status: DC

## 2020-12-02 MED ORDER — SILDENAFIL CITRATE 100 MG PO TABS: 50.0000 mg | ORAL_TABLET | Freq: Every day | ORAL | 3 refills | Status: DC | PRN

## 2020-12-02 MED ORDER — FAMOTIDINE 20 MG PO TABS: 20.0000 mg | ORAL_TABLET | Freq: Every day | ORAL | 1 refills | Status: DC

## 2020-12-02 MED ORDER — EZETIMIBE-SIMVASTATIN 10-20 MG PO TABS: 0.5000 | ORAL_TABLET | Freq: Every day | ORAL | 1 refills | Status: DC

## 2020-12-02 MED ORDER — METFORMIN HCL 500 MG PO TABS: 500.0000 mg | ORAL_TABLET | Freq: Two times a day (BID) | ORAL | 1 refills | Status: DC

## 2020-12-02 MED ORDER — RIVAROXABAN 20 MG PO TABS: 20.0000 mg | ORAL_TABLET | Freq: Every day | ORAL | 3 refills | Status: DC

## 2020-12-02 NOTE — Progress Notes (Signed)
Established Patient Office Visit  Subjective:  Patient ID: Michael Livingston, male    DOB: [birth year]  Age: 60 y.o. MRN: [id number]  CC:  Chief Complaint  Patient presents with   Diabetes   Hyperlipidemia   Hypertension    HPI [name] presents for medical follow-up.  He has history of CAD, atrial fibrillation, past history of pulmonary sarcoidosis, type 2 diabetes, hyperlipidemia.  He apparently had started going this past year to a alternative health type clinic and is getting testosterone replacements although previous testosterone levels here 2 years ago were normal.  Generally doing well.  Blood sugars been stable on metformin alone.  Does not monitor regularly.  No polyuria or polydipsia.  He has history of atrial fibrillation has had previous ablation.  He remains on Xarelto but is not aware of any recent flareups with his atrial fibrillation.  He is still followed regularly by cardiology.  He is requesting to go ahead and get labs today in terms of lipids and chemistries.  Last labs were done last October.  He has obtained some labs through the [facility] care clinic that he is going to for testosterone including PSA.  Requesting refills of basically all of his medications today.  He does take Viagra periodically for erectile dysfunction and he knows not to mix this with nitroglycerin.  Past Medical History:  Diagnosis Date   Allergy    Atrial fibrillation ([place])    a. diagnosed in 01/2017 --> s/p DCCV. Started on Xarelto for anticoagulation.    CAD (coronary artery disease)    a. s/p DES to LAD in 2005 b. patent by cath in 2007 with 30% LCx stenosis noted at that time c. normal NST in 05/2011   Chronic kidney disease    1 kidney stone   Diabetes mellitus without complication ([place])    type 2, no meds needed   Hyperlipidemia    Hypertension    Myocardial infarction [facility])     Past Surgical History:  Procedure Laterality Date   ATRIAL FIBRILLATION ABLATION N/A  05/07/2019   Procedure: ATRIAL FIBRILLATION ABLATION;  Surgeon: Thompson Grayer, MD;  Location: [place] [facility];  Service: Cardiovascular;  Laterality: N/A;   CARDIAC SURGERY  2005   stent   CORONARY STENT PLACEMENT     KNEE ARTHROSCOPY     left 2003, right 2005   LEFT HEART CATH AND CORONARY ANGIOGRAPHY N/A 12/15/2017   Procedure: LEFT HEART CATH AND CORONARY ANGIOGRAPHY;  Surgeon: Troy Sine, MD;  Location: [place] [facility];  Service: Cardiovascular;  Laterality: N/A;   SHOULDER SURGERY      Family History  Problem Relation Age of Onset   Diabetes Mother    Heart disease Father    Diabetes Father    Heart disease Paternal Grandfather    Colon cancer Neg Hx    Esophageal cancer Neg Hx    Stomach cancer Neg Hx    Rectal cancer Neg Hx     Social History   Socioeconomic History   Marital status: Married    Spouse name: Not on file   Number of children: Not on file   Years of education: Not on file   Highest education level: Not on file  Occupational History   Not on file  Tobacco Use   Smoking status: Former    Packs/day: 1.00    Years: 8.00    Pack years: 8.00    Types: Cigarettes    Quit date:  07/17/1987    Years since quitting: 33.4   Smokeless tobacco: Never  Vaping Use   Vaping Use: Never used  Substance and Sexual Activity   Alcohol use: No    Alcohol/week: 0.0 standard drinks   Drug use: No   Sexual activity: Not on file  Other Topics Concern   Not on file  Social History Narrative   Not on file   Social Determinants of Health   Financial Resource Strain: Not on file  Food Insecurity: Not on file  Transportation Needs: Not on file  Physical Activity: Not on file  Stress: Not on file  Social Connections: Not on file  Intimate Partner Violence: Not on file    Outpatient Medications Prior to Visit  Medication Sig Dispense Refill   diltiazem (CARDIZEM) 30 MG tablet Take 1 tablet (30 mg total) by mouth daily as needed. 90 tablet 3   Multiple  Vitamins-Minerals (MULTIVITAMIN WITH MINERALS) tablet Take 1 tablet by mouth at bedtime.      PAZEO 0.7 % SOLN Place 1 drop into both eyes daily as needed (allergies).   4   ezetimibe-simvastatin (VYTORIN) 10-20 MG tablet Take 0.5 tablets by mouth daily. Take half tablet by mouth daily 30 tablet 1   famotidine (PEPCID) 20 MG tablet TAKE 1 TABLET DAILY 90 tablet 1   lisinopril (ZESTRIL) 2.5 MG tablet TAKE 1 TABLET DAILY 90 tablet 3   metFORMIN (GLUCOPHAGE) 500 MG tablet TAKE 1 TABLET TWICE DAILY  WITH MEALS 180 tablet 1   metoprolol succinate (TOPROL-XL) 25 MG 24 hr tablet TAKE 1 TABLET BY MOUTH EVERY DAY 90 tablet 2   nitroGLYCERIN (NITROSTAT) 0.4 MG SL tablet Place 1 tablet (0.4 mg total) under the tongue every 5 (five) minutes as needed for chest pain. 25 tablet 2   pantoprazole (PROTONIX) 40 MG tablet TAKE 1 TABLET DAILY 30 TO  60 MINUTES BEFORE FIRST    MEAL OF THE DAY 90 tablet 3   sildenafil (VIAGRA) 100 MG tablet Take 0.5-1 tablets (50-100 mg total) by mouth daily as needed for erectile dysfunction. 15 tablet 3   XARELTO 20 MG TABS tablet TAKE 1 TABLET BY MOUTH DAILY WITH SUPPER 90 tablet 1   No facility-administered medications prior to visit.    Allergies  Allergen Reactions   Aleve [Naproxen Sodium] Swelling and Other (See Comments)    Mouth swells   Oxycodone Other (See Comments)    Rendered the patient unable to fall asleep AND he "almost passed out"   Sulfa Antibiotics Hives   Trazodone And Nefazodone Other (See Comments)    Seeing colored lights   Vicodin [Hydrocodone-Acetaminophen] Other (See Comments)    Rendered the patient unable to fall asleep AND he "almost passed out"    ROS Review of Systems  Constitutional:  Negative for fatigue and unexpected weight change.  Eyes:  Negative for visual disturbance.  Respiratory:  Negative for cough, chest tightness and shortness of breath.   Cardiovascular:  Negative for chest pain, palpitations and leg swelling.  Endocrine:  Negative for polydipsia and polyuria.  Neurological:  Negative for dizziness, syncope, weakness, light-headedness and headaches.     Objective:    Physical Exam Constitutional:      Appearance: He is well-developed.  HENT:     Right Ear: External ear normal.     Left Ear: External ear normal.  Eyes:     Pupils: Pupils are equal, round, and reactive to light.  Neck:     Thyroid: No  thyromegaly.  Cardiovascular:     Rate and Rhythm: Normal rate and regular rhythm.  Pulmonary:     Effort: Pulmonary effort is normal. No respiratory distress.     Breath sounds: Normal breath sounds. No wheezing or rales.  Musculoskeletal:     Cervical back: Neck supple.     Right lower leg: No edema.     Left lower leg: No edema.  Neurological:     Mental Status: He is alert and oriented to person, place, and time.    BP 140/90 (BP Location: Left Arm, Patient Position: Sitting)   Pulse (!) 56   Temp 97.7 F (36.5 C) (Oral)   Wt 213 lb 12.8 oz (97 kg)   SpO2 98%   BMI 27.45 kg/m  Wt Readings from Last 3 Encounters:  12/02/20 213 lb 12.8 oz (97 kg)  03/24/20 218 lb 3.2 oz (99 kg)  02/18/20 212 lb 14.4 oz (96.6 kg)     Health Maintenance Due  Topic Date Due   FOOT EXAM  Never done   OPHTHALMOLOGY EXAM  01/17/2019    There are no preventive care reminders to display for this patient.  Lab Results  Component Value Date   TSH 1.71 12/05/2017   Lab Results  Component Value Date   WBC 5.6 02/18/2020   HGB 15.0 02/18/2020   HCT 43.7 02/18/2020   MCV 92.8 02/18/2020   PLT 234 02/18/2020   Lab Results  Component Value Date   NA 138 02/18/2020   K 4.8 02/18/2020   CO2 26 02/18/2020   GLUCOSE 104 (H) 02/18/2020   BUN 18 02/18/2020   CREATININE 0.83 02/18/2020   BILITOT 0.8 02/18/2020   ALKPHOS 58 09/02/2019   AST 18 02/18/2020   ALT 14 02/18/2020   PROT 7.7 02/18/2020   ALBUMIN 4.4 09/02/2019   CALCIUM 9.8 02/18/2020   ANIONGAP 10 09/02/2019   GFR 78.81 12/05/2017   Lab  Results  Component Value Date   CHOL 141 02/18/2020   Lab Results  Component Value Date   HDL 45 02/18/2020   Lab Results  Component Value Date   LDLCALC 78 02/18/2020   Lab Results  Component Value Date   TRIG 94 02/18/2020   Lab Results  Component Value Date   CHOLHDL 3.1 02/18/2020   Lab Results  Component Value Date   HGBA1C 5.4 12/02/2020      Assessment & Plan:   #1 type 2 diabetes well controlled with A1c 5.4%  -We discussed option of possibly dropping back his metformin to once daily and recheck A1c in 6 months -Recommend continue with yearly eye exams  #2 history of atrial fibrillation.  Patient currently sinus rhythm.  Previous ablation. -Continue follow-up with cardiology-unless instructed otherwise by cardiology.  Continue Xarelto with refills given  #3 hyperlipidemia.  Goal LDL less than 70.  Patient on Vytorin -Refill Vytorin for 1 year -Check lipid and hepatic panel  #4 erectile dysfunction -Refill Viagra.  He knows this cannot be mixed with nitroglycerin  #5 history of GERD stable on Protonix -Protonix refilled for 1 year   Meds ordered this encounter  Medications   ezetimibe-simvastatin (VYTORIN) 10-20 MG tablet    Sig: Take 0.5 tablets by mouth daily. Take half tablet by mouth daily    Dispense:  90 tablet    Refill:  1   famotidine (PEPCID) 20 MG tablet    Sig: Take 1 tablet (20 mg total) by mouth daily.    Dispense:  90 tablet    Refill:  1   lisinopril (ZESTRIL) 2.5 MG tablet    Sig: Take 1 tablet (2.5 mg total) by mouth daily.    Dispense:  90 tablet    Refill:  1   metFORMIN (GLUCOPHAGE) 500 MG tablet    Sig: Take 1 tablet (500 mg total) by mouth 2 (two) times daily with a meal.    Dispense:  180 tablet    Refill:  1   metoprolol succinate (TOPROL-XL) 25 MG 24 hr tablet    Sig: Take 1 tablet (25 mg total) by mouth daily.    Dispense:  90 tablet    Refill:  3   nitroGLYCERIN (NITROSTAT) 0.4 MG SL tablet    Sig: Place 1 tablet  (0.4 mg total) under the tongue every 5 (five) minutes as needed for chest pain.    Dispense:  25 tablet    Refill:  2   pantoprazole (PROTONIX) 40 MG tablet    Sig: TAKE 1 TABLET DAILY 30 TO  60 MINUTES BEFORE FIRST    MEAL OF THE DAY    Dispense:  90 tablet    Refill:  3   DISCONTD: sildenafil (VIAGRA) 100 MG tablet    Sig: Take 0.5-1 tablets (50-100 mg total) by mouth daily as needed for erectile dysfunction.    Dispense:  15 tablet    Refill:  3   rivaroxaban (XARELTO) 20 MG TABS tablet    Sig: Take 1 tablet (20 mg total) by mouth daily with supper.    Dispense:  90 tablet    Refill:  3   sildenafil (VIAGRA) 100 MG tablet    Sig: Take 0.5-1 tablets (50-100 mg total) by mouth daily as needed for erectile dysfunction.    Dispense:  15 tablet    Refill:  3    Follow-up: Return in about 6 months (around 06/04/2021).    Carolann Littler, MD

## 2020-12-15 ENCOUNTER — Telehealth: Payer: Self-pay

## 2020-12-15 NOTE — Telephone Encounter (Signed)
Patient is following up as he still hasn't heard back regarding testosterone therapy. Please advise when able.

## 2020-12-15 NOTE — Telephone Encounter (Signed)
Not needed

## 2020-12-16 ENCOUNTER — Telehealth: Payer: Self-pay

## 2020-12-16 NOTE — Telephone Encounter (Signed)
From Mychart message: Daylynn, Richie, MD 12 days ago  Thank you for letting me know.  Rockne Menghini, RPH-CPP  Michael Livingston 12 days ago  Michael Livingston   Dr. Claiborne Billings will be back in the office late next week.  I'll have him review this question at that time.   Thank you, Tommy Medal PharmD  Kathaleen Grinder, MD 13 days ago (12-02-20)  Is it safe for me to get testosterone therapy to increase libido, energy? If so, what is safest amount and form for me pellet or shot? Thank you,  Pt is calling back for status of this message.

## 2020-12-16 NOTE — Telephone Encounter (Signed)
See Mychart note 

## 2020-12-16 NOTE — Telephone Encounter (Signed)
I have the pt on the line following up on a phone note yesterday... I see that you responded not needed but im not able to see the pts original message.. hes just wanting you to elaborate a bit

## 2020-12-17 NOTE — Telephone Encounter (Signed)
Patient calling back again. He states he has called a total of 5 times.

## 2020-12-17 NOTE — Telephone Encounter (Signed)
The patient called in again needing an answer about the Testerone. He stated that he already had one testosterone pellet placed and is due for another tomorrow. He has a history of afib and would like to know if it is safe for him.  After this next pellet treatment, he may switch to weekly injections due to the pain of the pellet injection.

## 2020-12-18 NOTE — Telephone Encounter (Signed)
I do not know what his testosterone levels are.  If he is to receive treatment he would need to be monitored with follow-up testosterone levels.  I would aim to keep his level adequate to perform but not too high.  He will also need to monitor his heart rate and blood pressure.

## 2020-12-18 NOTE — Telephone Encounter (Signed)
Called patient, gave response from MD.  Patient verbalized understanding.   

## 2020-12-18 NOTE — Telephone Encounter (Signed)
Attempted to contact patient to discuss further.  LVM 08/12

## 2021-04-07 ENCOUNTER — Other Ambulatory Visit: Payer: Self-pay | Admitting: Family Medicine

## 2021-04-07 DIAGNOSIS — R0609 Other forms of dyspnea: Secondary | ICD-10-CM

## 2021-05-12 ENCOUNTER — Other Ambulatory Visit: Payer: Self-pay | Admitting: Family Medicine

## 2021-05-25 ENCOUNTER — Telehealth (INDEPENDENT_AMBULATORY_CARE_PROVIDER_SITE_OTHER): Payer: BC Managed Care – PPO | Admitting: Family Medicine

## 2021-05-25 DIAGNOSIS — U071 COVID-19: Secondary | ICD-10-CM

## 2021-05-25 MED ORDER — MOLNUPIRAVIR EUA 200MG CAPSULE: 4.0000 | ORAL_CAPSULE | Freq: Two times a day (BID) | ORAL | 0 refills | Status: AC

## 2021-05-25 NOTE — Progress Notes (Signed)
Virtual Visit via Video Note  I connected with Michael Livingston  on 05/25/21 at  4:20 PM EST by a video enabled telemedicine application and verified that I am speaking with the correct person using two identifiers.  Location patient: [place] Location provider:work or home office Persons participating in the virtual visit: patient, provider  I discussed the limitations and requested verbal permission for telemedicine visit. The patient expressed understanding and agreed to proceed.   HPI:  Acute telemedicine visit for Covid19: -Onset: 3 days ago; tested positive yesterday -Symptoms include: nasal congestion, feels very mild SOB, some diarrhea -Denies: fever, CP, vomiting -drinking fluids, able to get up and around -Pertinent past medical history: see below, never had covid before -Pertinent medication allergies: Allergies  Allergen Reactions   Aleve [Naproxen Sodium] Swelling and Other (See Comments)    Mouth swells   Oxycodone Other (See Comments)    Rendered the patient unable to fall asleep AND he "almost passed out"   Sulfa Antibiotics Hives   Trazodone And Nefazodone Other (See Comments)    Seeing colored lights   Vicodin [Hydrocodone-Acetaminophen] Other (See Comments)    Rendered the patient unable to fall asleep AND he "almost passed out"  -COVID-19 vaccine status: 2 doses of mrna Immunization History  Administered Date(s) Administered   Influenza Inj Mdck Quad Pf 02/25/2017   Influenza, Seasonal, Injecte, Preservative Fre 02/28/2013, 01/24/2014   Influenza,inj,Quad PF,6+ Mos 02/03/2015, 03/05/2018, 02/05/2019, 02/18/2020, 02/25/2021   Influenza-Unspecified 12/18/2015   Pneumococcal Polysaccharide-23 03/07/2018, 02/05/2019   Td 12/29/2016   Tdap 05/18/2009     ROS: See pertinent positives and negatives per HPI.  Past Medical History:  Diagnosis Date   Allergy    Atrial fibrillation ([place])    a. diagnosed in 01/2017 --> s/p DCCV. Started on Xarelto for anticoagulation.    CAD  (coronary artery disease)    a. s/p DES to LAD in 2005 b. patent by cath in 2007 with 30% LCx stenosis noted at that time c. normal NST in 05/2011   Chronic kidney disease    1 kidney stone   Diabetes mellitus without complication ([place])    type 2, no meds needed   Hyperlipidemia    Hypertension    Myocardial infarction [facility])     Past Surgical History:  Procedure Laterality Date   ATRIAL FIBRILLATION ABLATION N/A 05/07/2019   Procedure: ATRIAL FIBRILLATION ABLATION;  Surgeon: Thompson Grayer, MD;  Location: [place];  Service: Cardiovascular;  Laterality: N/A;   CARDIAC SURGERY  2005   stent   CORONARY STENT PLACEMENT     KNEE ARTHROSCOPY     left 2003, right 2005   LEFT HEART CATH AND CORONARY ANGIOGRAPHY N/A 12/15/2017   Procedure: LEFT HEART CATH AND CORONARY ANGIOGRAPHY;  Surgeon: Troy Sine, MD;  Location: [place] [facility];  Service: Cardiovascular;  Laterality: N/A;   SHOULDER SURGERY       Current Outpatient Medications:    molnupiravir EUA (LAGEVRIO) 200 mg CAPS capsule, Take 4 capsules (800 mg total) by mouth 2 (two) times daily for 5 days., Disp: 40 capsule, Rfl: 0   diltiazem (CARDIZEM) 30 MG tablet, Take 1 tablet (30 mg total) by mouth daily as needed., Disp: 90 tablet, Rfl: 3   ezetimibe-simvastatin (VYTORIN) 10-20 MG tablet, Take 0.5 tablets by mouth daily. Take half tablet by mouth daily, Disp: 90 tablet, Rfl: 1   famotidine (PEPCID) 20 MG tablet, Take 1 tablet (20 mg total) by mouth daily., Disp: 90 tablet, Rfl:  1   lisinopril (ZESTRIL) 2.5 MG tablet, Take 1 tablet (2.5 mg total) by mouth daily., Disp: 90 tablet, Rfl: 1   metFORMIN (GLUCOPHAGE) 500 MG tablet, Take 1 tablet (500 mg total) by mouth 2 (two) times daily with a meal., Disp: 180 tablet, Rfl: 1   metoprolol succinate (TOPROL-XL) 25 MG 24 hr tablet, Take 1 tablet (25 mg total) by mouth daily., Disp: 90 tablet, Rfl: 3   Multiple Vitamins-Minerals (MULTIVITAMIN WITH MINERALS) tablet, Take 1 tablet  by mouth at bedtime. , Disp: , Rfl:    nitroGLYCERIN (NITROSTAT) 0.4 MG SL tablet, Place 1 tablet (0.4 mg total) under the tongue every 5 (five) minutes as needed for chest pain., Disp: 25 tablet, Rfl: 2   pantoprazole (PROTONIX) 40 MG tablet, TAKE 1 TABLET DAILY 30 TO  60 MINUTES BEFORE FIRST    MEAL OF THE DAY, Disp: 90 tablet, Rfl: 3   PAZEO 0.7 % SOLN, Place 1 drop into both eyes daily as needed (allergies). , Disp: , Rfl: 4   rivaroxaban (XARELTO) 20 MG TABS tablet, Take 1 tablet (20 mg total) by mouth daily with supper., Disp: 90 tablet, Rfl: 3   sildenafil (VIAGRA) 100 MG tablet, Take 0.5-1 tablets (50-100 mg total) by mouth daily as needed for erectile dysfunction., Disp: 15 tablet, Rfl: 3  EXAM:  VITALS per patient if applicable:  GENERAL: alert, oriented, appears well and in no acute distress  HEENT: atraumatic, conjunttiva clear, no obvious abnormalities on inspection of external nose and ears  NECK: normal movements of the head and neck  LUNGS: on inspection no signs of respiratory distress, breathing rate appears normal, no obvious gross SOB, gasping or wheezing  CV: no obvious cyanosis  MS: moves all visible extremities without noticeable abnormality  PSYCH/NEURO: pleasant and cooperative, no obvious depression or anxiety, speech and thought processing grossly intact  ASSESSMENT AND PLAN:  Discussed the following assessment and plan:  COVID-19   Discussed treatment options and risk of drug interactions, ideal treatment window, potential complications, isolation and precautions for COVID-19. After lengthy discussion, the patient opted for treatment with Legevrio due to being higher risk for complications of covid or severe disease and other factors. Discussed EUA status of this drug and the fact that there is preliminary limited knowledge of risks/interactions/side effects per EUA document vs possible benefits and precautions. This information was shared with patient  during the visit and also was provided in patient instructions. Other symptomatic care measures summarized in patient instructions. Advised to seek prompt virtual visit or in person care if worsening, new symptoms arise, or if is not improving with treatment as expected per our conversation of expected course. I discussed the assessment and treatment plan with the patient. The patient was provided an opportunity to ask questions and all were answered. The patient agreed with the plan and demonstrated an understanding of the instructions.     Lucretia Kern, DO

## 2021-05-25 NOTE — Patient Instructions (Signed)
HOME CARE TIPS:   -I sent the medication(s) we discussed to your pharmacy: Meds ordered this encounter  Medications   molnupiravir EUA (LAGEVRIO) 200 mg CAPS capsule    Sig: Take 4 capsules (800 mg total) by mouth 2 (two) times daily for 5 days.    Dispense:  40 capsule    Refill:  0     -I sent in the [facility] treatment or referral you requested per our discussion. Please see the information provided below and discuss further with the pharmacist/treatment team.    -there is a chance of rebound illness after finishing your treatment. If you become sick again please isolate for an additional 5 days, plus 5 more days of masking.   -can use tylenol if needed for fevers, aches and pains per instructions  -can use nasal saline a few times per day if you have nasal congestion  -stay hydrated, drink plenty of fluids and eat small healthy meals - avoid dairy  -stay home while sick, except to seek medical care. If you have COVID19, you will likely be contagious for 7-10 days. Flu or Influenza is likely contagious for about 7 days. Other respiratory viral infections remain contagious for 5-10+ days depending on the virus and many other factors. Wear a good mask that fits snugly (such as N95 or KN95) if around others to reduce the risk of transmission.  It was nice to meet you today, and I really hope you are feeling better soon. I help [facility] out with telemedicine visits on Tuesdays and Thursdays and am happy to help if you need a follow up virtual visit on those days. Otherwise, if you have any concerns or questions following this visit please schedule a follow up visit with your Primary Care doctor or seek care at a local urgent care clinic to avoid delays in care.    Seek in person care or schedule a follow up video visit promptly if your symptoms worsen, new concerns arise or you are not improving with treatment. Call 911 and/or seek emergency care if your symptoms are severe or life  threatening.    Fact Sheet for Patients And Caregivers Emergency Use Authorization (EUA) Of LAGEVRIO (molnupiravir) capsules For Coronavirus Disease 2019 (COVID-19)  What is the most important information I should know about LAGEVRIO? LAGEVRIO may cause serious side effects, including: ? LAGEVRIO may cause harm to your unborn baby. It is not known if LAGEVRIO will harm your baby if you take LAGEVRIO during pregnancy. o LAGEVRIO is not recommended for use in pregnancy. o LAGEVRIO has not been studied in pregnancy. LAGEVRIO was studied in pregnant animals only. When LAGEVRIO was given to pregnant animals, LAGEVRIO caused harm to their unborn babies. o You and your healthcare provider may decide that you should take LAGEVRIO during pregnancy if there are no other COVID-19 treatment options approved or authorized by the FDA that are accessible or clinically appropriate for you. o If you and your healthcare provider decide that you should take LAGEVRIO during pregnancy, you and your healthcare provider should discuss the known and potential benefits and the potential risks of taking LAGEVRIO during pregnancy. For individuals who are able to become pregnant: ? You should use a reliable method of birth control (contraception) consistently and correctly during treatment with LAGEVRIO and for 4 days after the last dose of LAGEVRIO. Talk to your healthcare provider about reliable birth control methods. ? Before starting treatment with [facility] your healthcare provider may do a pregnancy test to see  if you are pregnant before starting treatment with LAGEVRIO. ? Tell your healthcare provider right away if you become pregnant or think you may be pregnant during treatment with LAGEVRIO. Pregnancy Surveillance Program: ? There is a pregnancy surveillance program for individuals who take LAGEVRIO during pregnancy. The purpose of this program is to collect information about the health of you and your  baby. Talk to your healthcare provider about how to take part in this program. ? If you take LAGEVRIO during pregnancy and you agree to participate in the pregnancy surveillance program and allow your healthcare provider to share your information with [place], then your healthcare provider will report your use of [place] during pregnancy to [place]. by calling [phone] or PeacefulBlog.es. For individuals who are sexually active with partners who are able to become pregnant: ? It is not known if LAGEVRIO can affect sperm. While the risk is regarded as low, animal studies to fully assess the potential for LAGEVRIO to affect the babies of males treated with LAGEVRIO have not been completed. A reliable method of birth control (contraception) should be used consistently and correctly during treatment with LAGEVRIO and for at least 3 months after the last dose. The risk to sperm beyond 3 months is not known. Studies to understand the risk to sperm beyond 3 months are ongoing. Talk to your healthcare provider about reliable birth control methods. Talk to your healthcare provider if you have questions or concerns about how LAGEVRIO may affect sperm. You are being given this fact sheet because your healthcare provider believes it is necessary to provide you with LAGEVRIO for the treatment of adults with mild-to-moderate coronavirus disease 2019 (COVID-19) with positive results of direct SARS-CoV-2 viral testing, and who are at high risk for progression to severe COVID-19 including hospitalization or death, and for whom other COVID-19 treatment options approved or authorized by the FDA are not accessible or clinically appropriate. The U.S. Food and Drug Administration (FDA) has issued an Emergency Use Authorization (EUA) to make LAGEVRIO available during the COVID-19 pandemic (for more details about an EUA please see What is an Emergency Use  Authorization? at the end of this document). LAGEVRIO is not an FDA-approved medicine in the [place]. Read this Fact Sheet for information about LAGEVRIO. Talk to your healthcare provider about your options if you have any questions. It is your choice to take LAGEVRIO.  What is COVID-19? COVID-19 is caused by a virus called a coronavirus. You can get COVID-19 through close contact with another person who has the virus. COVID-19 illnesses have ranged from very mild-to-severe, including illness resulting in death. While information so far suggests that most COVID-19 illness is mild, serious illness can happen and may cause some of your other medical conditions to become worse. Older people and people of all ages with severe, long lasting (chronic) medical conditions like heart disease, lung disease and diabetes, for example seem to be at higher risk of being hospitalized for COVID-19.  What is LAGEVRIO? LAGEVRIO is an investigational medicine used to treat mild-to-moderate COVID-19 in adults: ? with positive results of direct SARS-CoV-2 viral testing, and ? who are at high risk for progression to severe COVID-19 including hospitalization or death, and for whom other COVID-19 treatment options approved or authorized by the FDA are not accessible or clinically appropriate. The FDA has authorized the emergency use of LAGEVRIO for the treatment of mild-tomoderate COVID-19 in adults under an EUA. For more information on EUA,  see the What is an Emergency Use Authorization (EUA)? section at the end of this Fact Sheet. LAGEVRIO is not authorized: ? for use in people less than 20 years of age. ? for prevention of COVID-19. ? for people needing hospitalization for COVID-19. ? for use for longer than 5 consecutive days.  What should I tell my healthcare provider before I take LAGEVRIO? Tell your healthcare provider if you: ? Have any allergies ? Are breastfeeding or plan to breastfeed ?  Have any serious illnesses ? Are taking any medicines (prescription, over-the-counter, vitamins, or herbal products).  How do I take LAGEVRIO? ? Take LAGEVRIO exactly as your healthcare provider tells you to take it. ? Take 4 capsules of LAGEVRIO every 12 hours (for example, at 8 am and at 8 pm) ? Take LAGEVRIO for 5 days. It is important that you complete the full 5 days of treatment with LAGEVRIO. Do not stop taking LAGEVRIO before you complete the full 5 days of treatment, even if you feel better. ? Take LAGEVRIO with or without food. ? You should stay in isolation for as long as your healthcare provider tells you to. Talk to your healthcare provider if you are not sure about how to properly isolate while you have COVID-19. ? Swallow LAGEVRIO capsules whole. Do not open, break, or crush the capsules. If you cannot swallow capsules whole, tell your healthcare provider. ? What to do if you miss a dose: o If it has been less than 10 hours since the missed dose, take it as soon as you remember o If it has been more than 10 hours since the missed dose, skip the missed dose and take your dose at the next scheduled time. ? Do not double the dose of LAGEVRIO to make up for a missed dose.  What are the important possible side effects of LAGEVRIO? ? See, What is the most important information I should know about LAGEVRIO? ? Allergic Reactions. Allergic reactions can happen in people taking LAGEVRIO, even after only 1 dose. Stop taking LAGEVRIO and call your healthcare provider right away if you get any of the following symptoms of an allergic reaction: o hives o rapid heartbeat o trouble swallowing or breathing o swelling of the mouth, lips, or face o throat tightness o hoarseness o skin rash The most common side effects of LAGEVRIO are: ? diarrhea ? nausea ? dizziness These are not all the possible side effects of LAGEVRIO. Not many people have taken LAGEVRIO. Serious and  unexpected side effects may happen. This medicine is still being studied, so it is possible that all of the risks are not known at this time.  What other treatment choices are there?  Veklury (remdesivir) is FDA-approved as an intravenous (IV) infusion for the treatment of mildto-moderate XOVAN-19 in certain adults and children. Talk with your doctor to see if Marijean Heath is appropriate for you. Like LAGEVRIO, FDA may also allow for the emergency use of other medicines to treat people with COVID-19. Go to LacrosseProperties.si for more information. It is your choice to be treated or not to be treated with LAGEVRIO. Should you decide not to take it, it will not change your standard medical care.  What if I am breastfeeding? Breastfeeding is not recommended during treatment with LAGEVRIO and for 4 days after the last dose of LAGEVRIO. If you are breastfeeding or plan to breastfeed, talk to your healthcare provider about your options and specific situation before taking LAGEVRIO.  How do I report  side effects with LAGEVRIO? Contact your healthcare provider if you have any side effects that bother you or do not go away. Report side effects to FDA MedWatch at SmoothHits.hu or call 1-800-FDA-1088 (1- ([phone] 591-1841).  How should I store [place]? ? Store LAGEVRIO capsules at room temperature between 34F to 37F (20C to 25C). ? Keep LAGEVRIO and all medicines out of the reach of children and pets. How can I learn more about COVID-19? ? Ask your healthcare provider. ? Visit SeekRooms.co.uk ? Contact your local or state public health department. ? Call [place] at [phone] (toll free in the [place] ? Visit [email or web address].com  What Is an Emergency Use Authorization (EUA)? The [place] FDA has made [place] available under an emergency access mechanism called an  Emergency Use Authorization (EUA) The EUA is supported by a Presenter, broadcasting Health and Human Service (HHS) declaration that circumstances exist to justify emergency use of drugs and biological products during the COVID-19 pandemic. LAGEVRIO for the treatment of mild-to-moderate COVID-19 in adults with positive results of direct SARS-CoV-2 viral testing, who are at high risk for progression to severe COVID-19, including hospitalization or death, and for whom alternative COVID-19 treatment options approved or authorized by FDA are not accessible or clinically appropriate, has not undergone the same type of review as an FDA-approved product. In issuing an EUA under the IPJAS-50 public health emergency, the FDA has determined, among other things, that based on the total amount of scientific evidence available including data from adequate and well-controlled clinical trials, if available, it is reasonable to believe that the product may be effective for diagnosing, treating, or preventing COVID-19, or a serious or life-threatening disease or condition caused by COVID-19; that the known and potential benefits of the product, when used to diagnose, treat, or prevent such disease or condition, outweigh the known and potential risks of such product; and that there are no adequate, approved, and available alternatives.  All of these criteria must be met to allow for the product to be used in the treatment of patients during the COVID-19 pandemic. The EUA for LAGEVRIO is in effect for the duration of the COVID-19 declaration justifying emergency use of LAGEVRIO, unless terminated or revoked (after which LAGEVRIO may no longer be used under the EUA). For patent information: http://rogers.info/ Copyright  2021-2022 [place], [place] and its affiliates. All rights reserved. [id number] Revised: March 2022

## 2021-06-15 ENCOUNTER — Ambulatory Visit: Payer: BC Managed Care – PPO | Admitting: Family Medicine

## 2021-06-15 VITALS — BP 130/70 | HR 75 | Temp 98.0°F | Wt 223.4 lb

## 2021-06-15 DIAGNOSIS — E785 Hyperlipidemia, unspecified: Secondary | ICD-10-CM | POA: Diagnosis not present

## 2021-06-15 DIAGNOSIS — H02846 Edema of left eye, unspecified eyelid: Secondary | ICD-10-CM | POA: Diagnosis not present

## 2021-06-15 DIAGNOSIS — E119 Type 2 diabetes mellitus without complications: Secondary | ICD-10-CM

## 2021-06-15 LAB — POCT GLYCOSYLATED HEMOGLOBIN (HGB A1C): Hemoglobin A1C: 5.9 % — AB (ref 4.0–5.6)

## 2021-06-15 MED ORDER — EZETIMIBE-SIMVASTATIN 10-20 MG PO TABS: 0.5000 | ORAL_TABLET | Freq: Every day | ORAL | 3 refills | Status: DC

## 2021-06-15 MED ORDER — LISINOPRIL 2.5 MG PO TABS: 2.5000 mg | ORAL_TABLET | Freq: Every day | ORAL | 3 refills | Status: DC

## 2021-06-15 NOTE — Progress Notes (Signed)
Established Patient Office Visit  Subjective:  Patient ID: Michael Livingston, male    DOB: 17-Jun-1960  Age: 61 y.o. MRN: [id number]  CC:  Chief Complaint  Patient presents with   Belepharitis    Red, swollen, sore feeling, x 1 day    HPI Michael Livingston presents for swelling left upper lid with onset today.  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  No blurred vision.  No eye drainage.  Has some swelling and mild pain of the left upper lid.  Type 2 diabetes.  Treated with metformin.  Last A1c was excellent at 5.4.  Does not monitor blood sugars.  No polyuria or polydipsia.  On Vytorin for hyperlipidemia.  Needs refills of Vytorin.  Last labs were in July.  Tolerating well with no side effects  Past Medical History:  Diagnosis Date   Allergy    Atrial fibrillation ([place])    a. diagnosed in 01/2017 --> s/p DCCV. Started on Xarelto for anticoagulation.    CAD (coronary artery disease)    a. s/p DES to LAD in 2005 b. patent by cath in 2007 with 30% LCx stenosis noted at that time c. normal NST in 05/2011   Chronic kidney disease    1 kidney stone   Diabetes mellitus without complication ([place])    type 2, no meds needed   Hyperlipidemia    Hypertension    Myocardial infarction [facility])     Past Surgical History:  Procedure Laterality Date   ATRIAL FIBRILLATION ABLATION N/A 05/07/2019   Procedure: ATRIAL FIBRILLATION ABLATION;  Surgeon: Thompson Grayer, MD;  Location: [facility];  Service: Cardiovascular;  Laterality: N/A;   CARDIAC SURGERY  2005   stent   CORONARY STENT PLACEMENT     KNEE ARTHROSCOPY     left 2003, right 2005   LEFT HEART CATH AND CORONARY ANGIOGRAPHY N/A 12/15/2017   Procedure: LEFT HEART CATH AND CORONARY ANGIOGRAPHY;  Surgeon: Troy Sine, MD;  Location: [facility];   Service: Cardiovascular;  Laterality: N/A;   SHOULDER SURGERY      Family History  Problem Relation Age of Onset   Diabetes Mother    Heart disease Father    Diabetes Father    Heart disease Paternal Grandfather    Colon cancer Neg Hx    Esophageal cancer Neg Hx    Stomach cancer Neg Hx    Rectal cancer Neg Hx     Social History   Socioeconomic History   Marital status: Married    Spouse name: Not on file   Number of children: Not on file   Years of education: Not on file   Highest education level: Not on file  Occupational History   Not on file  Tobacco Use   Smoking status: Former    Packs/day: 1.00    Years: 8.00    Pack years: 8.00    Types: Cigarettes    Quit date: 07/17/1987    Years since quitting: 33.9   Smokeless tobacco: Never  Vaping Use   Vaping Use: Never used  Substance and Sexual Activity   Alcohol use: No    Alcohol/week: 0.0 standard drinks   Drug use: No   Sexual activity: Not on file  Other Topics Concern   Not on file  Social History Narrative   Not on file   Social Determinants of Health   Financial Resource Strain: Not on file  Food Insecurity: Not on file  Transportation Needs: Not on file  Physical Activity: Not on file  Stress: Not on file  Social Connections: Not on file  Intimate Partner Violence: Not on file    Outpatient Medications Prior to Visit  Medication Sig Dispense Refill   famotidine (PEPCID) 20 MG tablet Take 1 tablet (20 mg total) by mouth daily. 90 tablet 1   metFORMIN (GLUCOPHAGE) 500 MG tablet Take 1 tablet (500 mg total) by mouth 2 (two) times daily with a meal. 180 tablet 1   metoprolol succinate (TOPROL-XL) 25 MG 24 hr tablet Take 1 tablet (25 mg total) by mouth daily. 90 tablet 3   Multiple Vitamins-Minerals (MULTIVITAMIN WITH MINERALS) tablet Take 1 tablet by mouth at bedtime.      nitroGLYCERIN (NITROSTAT) 0.4 MG SL tablet Place 1 tablet (0.4 mg total) under the tongue every 5 (five) minutes as needed for  chest pain. 25 tablet 2   pantoprazole (PROTONIX) 40 MG tablet TAKE 1 TABLET DAILY 30 TO  60 MINUTES BEFORE FIRST    MEAL OF THE DAY 90 tablet 3   PAZEO 0.7 % SOLN Place 1 drop into both eyes daily as needed (allergies).   4   rivaroxaban (XARELTO) 20 MG TABS tablet Take 1 tablet (20 mg total) by mouth daily with supper. 90 tablet 3   sildenafil (VIAGRA) 100 MG tablet Take 0.5-1 tablets (50-100 mg total) by mouth daily as needed for erectile dysfunction. 15 tablet 3   ezetimibe-simvastatin (VYTORIN) 10-20 MG tablet Take 0.5 tablets by mouth daily. Take half tablet by mouth daily 90 tablet 1   lisinopril (ZESTRIL) 2.5 MG tablet Take 1 tablet (2.5 mg total) by mouth daily. 90 tablet 1   diltiazem (CARDIZEM) 30 MG tablet Take 1 tablet (30 mg total) by mouth daily as needed. 90 tablet 3   No facility-administered medications prior to visit.    Allergies  Allergen Reactions   Aleve [Naproxen Sodium] Swelling and Other (See Comments)    Mouth swells   Oxycodone Other (See Comments)    Rendered the patient unable to fall asleep AND he "almost passed out"   Sulfa Antibiotics Hives   Trazodone And Nefazodone Other (See Comments)    Seeing colored lights   Vicodin [Hydrocodone-Acetaminophen] Other (See Comments)    Rendered the patient unable to fall asleep AND he "almost passed out"    ROS Review of Systems  Constitutional:  Negative for fatigue.  HENT:  Negative for sinus pressure and sinus pain.   Eyes:  Positive for pain. Negative for photophobia, discharge and visual disturbance.  Respiratory:  Negative for cough, chest tightness and shortness of breath.   Cardiovascular:  Negative for chest pain, palpitations and leg swelling.  Neurological:  Negative for dizziness, syncope, weakness, light-headedness and headaches.     Objective:    Physical Exam Vitals reviewed.  Eyes:     Extraocular Movements: Extraocular movements intact.     Conjunctiva/sclera: Conjunctivae normal.      Pupils: Pupils are equal, round, and reactive to light.     Comments: Cornea appears normal.    Left upper eyelid mild edema and mild erythema.   No obvious stye.    Cardiovascular:     Rate and Rhythm: Normal rate.  Pulmonary:  Effort: Pulmonary effort is normal.     Breath sounds: Normal breath sounds.  Musculoskeletal:     Right lower leg: No edema.     Left lower leg: No edema.  Neurological:     Mental Status: He is alert.    BP 130/70 (BP Location: Left Arm, Patient Position: Sitting, Cuff Size: Normal)    Pulse 75    Temp 98 F (36.7 C) (Oral)    Wt 223 lb 6.4 oz (101.3 kg)    SpO2 98%    BMI 28.68 kg/m  Wt Readings from Last 3 Encounters:  06/15/21 223 lb 6.4 oz (101.3 kg)  12/02/20 213 lb 12.8 oz (97 kg)  03/24/20 218 lb 3.2 oz (99 kg)     Health Maintenance Due  Topic Date Due   COVID-19 Vaccine (1) Never done   FOOT EXAM  Never done   HIV Screening  Never done   Hepatitis C Screening  Never done   Zoster Vaccines- Shingrix (1 of 2) Never done   OPHTHALMOLOGY EXAM  01/17/2019    There are no preventive care reminders to display for this patient.  Lab Results  Component Value Date   TSH 1.71 12/05/2017   Lab Results  Component Value Date   WBC 5.6 02/18/2020   HGB 15.0 02/18/2020   HCT 43.7 02/18/2020   MCV 92.8 02/18/2020   PLT 234 02/18/2020   Lab Results  Component Value Date   NA 138 12/02/2020   K 4.3 12/02/2020   CO2 27 12/02/2020   GLUCOSE 108 (H) 12/02/2020   BUN 18 12/02/2020   CREATININE 0.99 12/02/2020   BILITOT 1.0 12/02/2020   ALKPHOS 52 12/02/2020   AST 19 12/02/2020   ALT 17 12/02/2020   PROT 7.5 12/02/2020   ALBUMIN 4.4 12/02/2020   CALCIUM 9.4 12/02/2020   ANIONGAP 10 09/02/2019   GFR 84.29 12/02/2020   Lab Results  Component Value Date   CHOL 121 12/02/2020   Lab Results  Component Value Date   HDL 34.80 (L) 12/02/2020   Lab Results  Component Value Date   LDLCALC 58 12/02/2020   Lab Results  Component Value  Date   TRIG 139.0 12/02/2020   Lab Results  Component Value Date   CHOLHDL 3 12/02/2020   Lab Results  Component Value Date   HGBA1C 5.9 (A) 06/15/2021      Assessment & Plan:   #1 left upper eyelid edema.   No cellulitis changes.  No conjunctivitis.   No stye.  Recent exposure to dust/debris but no FB seen.   Suspect poor tear duct drainage  Warm compresses several times daily Be in touch if not resolving in one week  #2  Type 2 DM- well controlled with A1C of 5.9%  continue with current medication regimen.    #3  hyperlipidemia with goal LDL < 70. Refill Vytorin.   Will need follow up labs by July.   Meds ordered this encounter  Medications   ezetimibe-simvastatin (VYTORIN) 10-20 MG tablet    Sig: Take 0.5 tablets by mouth daily. Take half tablet by mouth daily    Dispense:  45 tablet    Refill:  3   lisinopril (ZESTRIL) 2.5 MG tablet    Sig: Take 1 tablet (2.5 mg total) by mouth daily.    Dispense:  90 tablet    Refill:  3    Follow-up: No follow-ups on file.    Carolann Littler, MD

## 2021-06-15 NOTE — Patient Instructions (Signed)
Use warm compresses to left eye several times daily   Let me know if left upper lid edema not improving over the next week or two.

## 2021-08-17 ENCOUNTER — Other Ambulatory Visit: Payer: Self-pay

## 2021-08-17 MED ORDER — FAMOTIDINE 20 MG PO TABS: 20.0000 mg | ORAL_TABLET | Freq: Every day | ORAL | 1 refills | Status: DC

## 2021-08-30 ENCOUNTER — Telehealth: Payer: Self-pay | Admitting: Family Medicine

## 2021-08-30 DIAGNOSIS — M25552 Pain in left hip: Secondary | ICD-10-CM

## 2021-08-30 NOTE — Telephone Encounter (Signed)
Pt c/o left hip pain x months, comes and goes. Patient referral to ortho for care of hip ?

## 2021-08-30 NOTE — Telephone Encounter (Signed)
Referral placed and pt is aware. 

## 2021-10-25 ENCOUNTER — Other Ambulatory Visit: Payer: Self-pay | Admitting: Family Medicine

## 2021-12-28 ENCOUNTER — Other Ambulatory Visit: Payer: Self-pay | Admitting: Family Medicine

## 2022-01-09 ENCOUNTER — Other Ambulatory Visit: Payer: Self-pay | Admitting: Family Medicine

## 2022-01-09 DIAGNOSIS — R0609 Other forms of dyspnea: Secondary | ICD-10-CM

## 2022-03-07 ENCOUNTER — Encounter: Payer: Self-pay | Admitting: Family Medicine

## 2022-03-07 ENCOUNTER — Ambulatory Visit: Payer: BC Managed Care – PPO | Admitting: Family Medicine

## 2022-03-07 VITALS — BP 132/78 | HR 67 | Temp 98.2°F | Ht 74.0 in | Wt 222.5 lb

## 2022-03-07 DIAGNOSIS — Z79899 Other long term (current) drug therapy: Secondary | ICD-10-CM | POA: Diagnosis not present

## 2022-03-07 DIAGNOSIS — E119 Type 2 diabetes mellitus without complications: Secondary | ICD-10-CM | POA: Diagnosis not present

## 2022-03-07 DIAGNOSIS — Z23 Encounter for immunization: Secondary | ICD-10-CM

## 2022-03-07 DIAGNOSIS — E785 Hyperlipidemia, unspecified: Secondary | ICD-10-CM | POA: Diagnosis not present

## 2022-03-07 LAB — CBC WITH DIFFERENTIAL/PLATELET
Basophils Absolute: 0 10*3/uL (ref 0.0–0.1)
Basophils Relative: 0.5 % (ref 0.0–3.0)
Eosinophils Absolute: 0.1 10*3/uL (ref 0.0–0.7)
Eosinophils Relative: 2 % (ref 0.0–5.0)
HCT: 43.1 % (ref 39.0–52.0)
Hemoglobin: 14.8 g/dL (ref 13.0–17.0)
Lymphocytes Relative: 24.5 % (ref 12.0–46.0)
Lymphs Abs: 1.7 10*3/uL (ref 0.7–4.0)
MCHC: 34.4 g/dL (ref 30.0–36.0)
MCV: 91.9 fl (ref 78.0–100.0)
Monocytes Absolute: 0.6 10*3/uL (ref 0.1–1.0)
Monocytes Relative: 9.3 % (ref 3.0–12.0)
Neutro Abs: 4.4 10*3/uL (ref 1.4–7.7)
Neutrophils Relative %: 63.7 % (ref 43.0–77.0)
Platelets: 239 10*3/uL (ref 150.0–400.0)
RBC: 4.69 Mil/uL (ref 4.22–5.81)
RDW: 13 % (ref 11.5–15.5)
WBC: 7 10*3/uL (ref 4.0–10.5)

## 2022-03-07 LAB — LIPID PANEL
Cholesterol: 164 mg/dL (ref 0–200)
HDL: 34.7 mg/dL — ABNORMAL LOW (ref >39.00)
NonHDL: 129.11
Total CHOL/HDL Ratio: 5
Triglycerides: 360 mg/dL — ABNORMAL HIGH (ref 0.0–149.0)
VLDL: 72 mg/dL — ABNORMAL HIGH (ref 0.0–40.0)

## 2022-03-07 LAB — BASIC METABOLIC PANEL
BUN: 20 mg/dL (ref 6–23)
CO2: 26 mEq/L (ref 19–32)
Calcium: 9.6 mg/dL (ref 8.4–10.5)
Chloride: 100 mEq/L (ref 96–112)
Creatinine, Ser: 0.97 mg/dL (ref 0.40–1.50)
GFR: 85.62 mL/min (ref >60.00)
Glucose, Bld: 111 mg/dL — ABNORMAL HIGH (ref 70–99)
Potassium: 4.1 mEq/L (ref 3.5–5.1)
Sodium: 134 mEq/L — ABNORMAL LOW (ref 135–145)

## 2022-03-07 LAB — HEPATIC FUNCTION PANEL
ALT: 18 U/L (ref 0–53)
AST: 19 U/L (ref 0–37)
Albumin: 4.6 g/dL (ref 3.5–5.2)
Alkaline Phosphatase: 55 U/L (ref 39–117)
Bilirubin, Direct: 0.2 mg/dL (ref 0.0–0.3)
Total Bilirubin: 0.8 mg/dL (ref 0.2–1.2)
Total Protein: 7.8 g/dL (ref 6.0–8.3)

## 2022-03-07 LAB — POCT GLYCOSYLATED HEMOGLOBIN (HGB A1C): Hemoglobin A1C: 6.3 % — AB (ref 4.0–5.6)

## 2022-03-07 LAB — LDL CHOLESTEROL, DIRECT: Direct LDL: 101 mg/dL

## 2022-03-07 MED ORDER — POLYMYXIN B-TRIMETHOPRIM 10000-0.1 UNIT/ML-% OP SOLN: 2.0000 [drp] | OPHTHALMIC | 0 refills | Status: AC

## 2022-03-07 NOTE — Progress Notes (Signed)
Established Patient Office Visit  Subjective   Patient ID: Michael Livingston, male    DOB: 1961/04/26  Age: 61 y.o. MRN: [id number]  Chief Complaint  Patient presents with   Eye Problem    Patient complains of right pain, eye redness and swelling x2 days ago, states he noticed symptoms after large amount of pond water went into the right eye while fishing    HPI   [name] is seen with right eye pain and a little redness of the right upper lid.  He states that [place] and as he was winding in the line some pond water basically came off the line and hit him in the right eye.  He noticed some soreness to the lid afterwards.  He has applied some warm compresses.  He felt that he may be developing a "stye ".  No blurred vision.  Denies any other actual eye injury.  Type 2 diabetes.  Is on metformin.  No recent A1c.  So medical problems include history of atrial fibrillation, CAD, sarcoidosis, hyperlipidemia.  Current medications include Vytorin, lisinopril, metformin, metoprolol succinate, Protonix, and Xarelto.  He is requesting follow-up labs including lipids today.  Past Medical History:  Diagnosis Date   Allergy    Atrial fibrillation ([place])    a. diagnosed in 01/2017 --> s/p DCCV. Started on Xarelto for anticoagulation.    CAD (coronary artery disease)    a. s/p DES to LAD in 2005 b. patent by cath in 2007 with 30% LCx stenosis noted at that time c. normal NST in 05/2011   Chronic kidney disease    1 kidney stone   Diabetes mellitus without complication ([place])    type 2, no meds needed   Hyperlipidemia    Hypertension    Myocardial infarction [facility])    Past Surgical History:  Procedure Laterality Date   ATRIAL FIBRILLATION ABLATION N/A 05/07/2019   Procedure: ATRIAL FIBRILLATION ABLATION;  Surgeon: Thompson Grayer, MD;  Location: [place] [facility];  Service: Cardiovascular;  Laterality: N/A;   CARDIAC SURGERY  2005   stent   CORONARY STENT PLACEMENT      KNEE ARTHROSCOPY     left 2003, right 2005   LEFT HEART CATH AND CORONARY ANGIOGRAPHY N/A 12/15/2017   Procedure: LEFT HEART CATH AND CORONARY ANGIOGRAPHY;  Surgeon: Troy Sine, MD;  Location: [place] [facility];  Service: Cardiovascular;  Laterality: N/A;   SHOULDER SURGERY      reports that he quit smoking about 34 years ago. His smoking use included cigarettes. He has a 8.00 pack-year smoking history. He has never used smokeless tobacco. He reports that he does not drink alcohol and does not use drugs. family history includes Diabetes in his father and mother; Heart disease in his father and paternal grandfather. Allergies  Allergen Reactions   Aleve [Naproxen Sodium] Swelling and Other (See Comments)    Mouth swells   Oxycodone Other (See Comments)    Rendered the patient unable to fall asleep AND he "almost passed out"   Sulfa Antibiotics Hives   Trazodone And Nefazodone Other (See Comments)    Seeing colored lights   Vicodin [Hydrocodone-Acetaminophen] Other (See Comments)    Rendered the patient unable to fall asleep AND he "almost passed out"    Review of Systems  Constitutional:  Negative for chills, fever and malaise/fatigue.  Eyes:  Positive for redness. Negative for blurred vision and discharge.  Respiratory:  Negative for  shortness of breath.   Cardiovascular:  Negative for chest pain.  Neurological:  Negative for dizziness, weakness and headaches.      Objective:     BP 132/78 (BP Location: Left Arm, Patient Position: Sitting, Cuff Size: Large)   Pulse 67   Temp 98.2 F (36.8 C) (Oral)   Ht '6\' 2"'$  (1.88 m)   Wt 222 lb 8 oz (100.9 kg)   SpO2 97%   BMI 28.57 kg/m  BP Readings from Last 3 Encounters:  03/07/22 132/78  06/15/21 130/70  12/02/20 140/90   Wt Readings from Last 3 Encounters:  03/07/22 222 lb 8 oz (100.9 kg)  06/15/21 223 lb 6.4 oz (101.3 kg)  12/02/20 213 lb 12.8 oz (97 kg)      Physical Exam Vitals reviewed.  Constitutional:       Appearance: Normal appearance.  Eyes:     Comments: Conjunctive appear normal bilaterally.  Right eye reveals some mild erythema of the upper lid diffusely.  No evidence for stye.  Cornea appears normal.  Fundi benign.  Cardiovascular:     Rate and Rhythm: Normal rate and regular rhythm.  Pulmonary:     Effort: Pulmonary effort is normal.     Breath sounds: Normal breath sounds.  Neurological:     Mental Status: He is alert.      Results for orders placed or performed in visit on 03/07/22  POC HgB A1c  Result Value Ref Range   Hemoglobin A1C 6.3 (A) 4.0 - 5.6 %   HbA1c POC (<> result, manual entry)     HbA1c, POC (prediabetic range)     HbA1c, POC (controlled diabetic range)        The ASCVD Risk score (Arnett DK, et al., 2019) failed to calculate for the following reasons:   The valid total cholesterol range is 130 to 320 mg/dL    Assessment & Plan:   #1 right eye irritation.  He has some mild erythema of the upper lid but no evidence for conjunctivitis.  No evidence for stye.  No vesicles or skin rash. -Start Polytrim ophthalmic drops-2 drops right eye every 4 hours while awake -Warm compresses several times daily -Follow-up if not improving of the next week  #2 type 2 diabetes controlled with A1c 6.3%.  Continue yearly eye exam.  Continue metformin.  #3 hyperlipidemia.  Patient on Vytorin.  Recheck lipid and hepatic panel.  #4 history of atrial fibrillation.  Patient on Xarelto.  Check basic metabolic panel and CBC.  -Flu vaccine given   No follow-ups on file.    Carolann Littler, MD

## 2022-03-07 NOTE — Patient Instructions (Signed)
Use warm compresses to right eye several times daily.

## 2022-03-08 ENCOUNTER — Telehealth: Payer: Self-pay | Admitting: Family Medicine

## 2022-03-08 NOTE — Telephone Encounter (Signed)
Patient returned call regarding lab results. Patient anxious to hear resuilts

## 2022-03-08 NOTE — Telephone Encounter (Signed)
Spoke with patient about lab results.

## 2022-03-09 ENCOUNTER — Other Ambulatory Visit: Payer: Self-pay

## 2022-03-09 DIAGNOSIS — E785 Hyperlipidemia, unspecified: Secondary | ICD-10-CM

## 2022-03-23 ENCOUNTER — Ambulatory Visit: Payer: BC Managed Care – PPO | Attending: Cardiovascular Disease | Admitting: Cardiovascular Disease

## 2022-03-23 ENCOUNTER — Encounter: Payer: Self-pay | Admitting: Cardiovascular Disease

## 2022-03-23 VITALS — BP 118/72 | HR 65 | Ht 74.0 in | Wt 224.2 lb

## 2022-03-23 DIAGNOSIS — I10 Essential (primary) hypertension: Secondary | ICD-10-CM | POA: Diagnosis not present

## 2022-03-23 DIAGNOSIS — E785 Hyperlipidemia, unspecified: Secondary | ICD-10-CM

## 2022-03-23 DIAGNOSIS — I4819 Other persistent atrial fibrillation: Secondary | ICD-10-CM

## 2022-03-23 DIAGNOSIS — E782 Mixed hyperlipidemia: Secondary | ICD-10-CM

## 2022-03-23 DIAGNOSIS — E119 Type 2 diabetes mellitus without complications: Secondary | ICD-10-CM

## 2022-03-23 DIAGNOSIS — I480 Paroxysmal atrial fibrillation: Secondary | ICD-10-CM

## 2022-03-23 DIAGNOSIS — K219 Gastro-esophageal reflux disease without esophagitis: Secondary | ICD-10-CM

## 2022-03-23 MED ORDER — EZETIMIBE-SIMVASTATIN 10-40 MG PO TABS: 1.0000 | ORAL_TABLET | Freq: Every day | ORAL | 2 refills | Status: DC

## 2022-03-23 NOTE — Progress Notes (Signed)
Patient ID: Michael Livingston, male   DOB: 10/06/1960, 60 y.o.   MRN: [id number]        HPI: Michael Livingston is a 61 y.o. male presents to the office for a 57-monthfollow-up cardiology evaluation.  [name] and in October 2005 underwent stenting to his proximal to mid left anterior descending artery. His last catheterization was in October 2007 and his LAD was  was widely patent. He did have mild 30% circumflex stenosis. A nuclear perfusion study in January 2013 continued to show normal perfusion.  [name] a history of palpitations which have been controlled with beta blocker therapy. He has a history of hyperlipidemia with an atherogenic lipid panel with low HDL levels. An NMR lipoprofile in 2014 demonstrated increased insulin resistance and an increased wall LDL particles.  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 Since that time, he has lost over 20 pounds. He denies recent chest pain. He denies significant increase in palpitations except a rare episode.   He is no longer is working for [employer] but is now owns a [facility]  He denies any exertional chest pain or dyspnea.  He denies palpitations.  He denies PND, orthopnea.  Last year he underwent orthopedic surgery to his shoulder and tolerated this well.    He was started on metformin for diabetes mellitus.  In June 2017 hemoglobin A1c was 6.4, and most recently in January 2018  improved to 5.7.  I last saw him, he did note mild shortness of breath with arm raising and denied exertional chest pain or palpitations.  He was on aspirin and Plavix for dual antiplatelet therapy.  He was on ramipril 5 mg, metoprolol 25 mg twice a day for hypertension and Vytorin 1044.  Hyperlipidemia.  Heme notes mild shortness of breath when he raises his arms.  He denies exertional chest pain symptoms or exertional dyspnea.  He continues to be on  aspirin and Plavix for dual antiplatelet therapy.  He continues to be on ram a pro-5 mg, metoprolol 25 mg twice a day for hypertension.  He is on Vytorin 10/40 for hyperlipidemia, as well as lovaza.  Since I last saw him, he developed new onset atrial fibrillation on 01/19/2017.  [facility]  CBC be met were normal as was his TSH.  He underwent successful DC cardioversion at 150 J with restoration of sinus rhythm.  His Lopressor dose was increased to 30 mg twice a day, and he was started on Xarelto 20 mg for a cha2ds2score of 3 (CAD, hypertension, diabetes mellitus.  His Plavix was discontinued.  He saw Michael Livingston office follow-up.  He was doing well without recurrence.  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  The month previously, he had significantly banged his head on a piling intrahepatic and as result was very concerned about work-related trauma, particularly while being on Xarelto. He was unaware of any recurrent episodes of arrhythmia.  We had a lengthy discussion about risk-benefit ratio of anticoagulation, particularly with his job.  He preferred not continue Xarelto due to his job risk.  As result, Xarelto was discontinued and he was resumed back on aspirin 81 mg and Plavix 75 mg. I increased metoprolol recommended he continue to take lisinopril 20 mg daily.  If he were to develop any recurrent episodes of AFeinitiation of anticoagulation would be necessary.  When I saw him in the office in March 2019 he apparently was only taking metoprolol tartrate once a day.  As result, I recommended switching him to metoprolol succinate to allow for improved sustained action.  He had been doing well was unaware of any arrhythmias until October 10, 2017.  It was extremely hot, he was working on a roof, he was dehydrated, and he noticed his heart rate speeding up.  He quickly got off the roof.  He was seen by  Michael [facility]ston and presented to [facility] ER where he was found to be in AF with ventricular rate in the 150s to 160s.  I saw him in the emergency room and in the ER he underwent successful cardioversion with restoration of sinus rhythm.  At that time he was advised to increase metoprolol succinate to 37.5 mg and instead of taking the medication at bedtime to take it in the morning.  He was also given a prescription for short acting diltiazem 30 mg to take on a as needed basis.  I saw him in the office 1 week later on October 18, 2017 at which time he was doing well and was not experiencing any recurrent arrhythmia.    He was seen as an add-on in the office on December 11, 2017 by [facility].  At that time he had complaints of shortness of breath with activity as well as vague chest fullness and tightness with minimal work.  He was set up for repeat cardiac catheterization by me on December 15, 2017.  This did not reveal any significant residual CAD and he had overlapping stents in the proximal to mid LAD with smooth intimal hyperplasia of 20%.  He had a normal ramus intermediate, dominant left circumflex nondominant RCA.  Medical therapy was recommended and if he had recurrent PAF consider potential candidacy for ablation.  He was restarted back on Xarelto.  He was seen for hospital follow-up with Michael Livingston, [facility] on January 02, 2018 and he was doing well.  He has occasional chest pain twinges had largely resolved with Protonix.  I last saw him on July 26, 2018 at which time he was feeling well and denied any recurrent episodes of arrhythmia.  He had previously been evaluated on  on June 18, 2018 by Michael [facility]ston because of concerns for possible blood pressure elevation with monitoring of his home blood pressure unit.  Lisinopril was initiated.  During my evaluation he was on lisinopril 5 mg, metoprolol succinate 37.5 mg daily and has a prescription for diltiazem 30 mg to take as needed if recurrent  palpitations.  He  not required any additional diltiazem supplementation.  He continued to take Protonix for GERD.  He is on Xarelto for anticoagulation.  He continued to be on Vytorin 10/40 for hyperlipidemia.    Mr. Weckwerth states that he had done fairly well without recurrent arrhythmia until March 07, 2019.  On that day, he had taken his father to our office and he had a syncopal spell outside while waiting for the elevator.  As result, only had to take his father to the emergency room and ultimately he required a permanent pacemaker.  However later that night upon coming home from the hospital he was eating dinner late and developed recurrent tachycardia.  He had taken diltiazem 30 mg x 2 without benefit and ultimately presented to the emergency room  where again he was found to be in atrial fibrillation.  Since he was anticoagulated on Xarelto he underwent another successful cardioversion in the emergency room with restoration of sinus rhythm.  He denied any associated anginal symptomatology.  He has continued to take metoprolol succinate 37.5 mg daily, lisinopril 2.5 mg, and is on ezetimibe simvastatin 10/20.  He continues to be on Xarelto 20 mg without bleeding.    At his evaluation with me in November 2020 I referred him to Dr. Rayann Livingston for consideration of ablation and on May 07, 2019 underwent atrial fibrillation ablation with successful electrical isolation and anatomical encircling of all 4 pulmonary veins.  He was seen by Michael Deforest, PA in follow-up in May 2021 which time he continued to do well.  He continued on very low-dose metoprolol succinate 25 mg as well as Xarelto.  He was not having any chest pain.  He was seen by Michael Livingston in EP clinic in July.  He was stable with reference to his prior ablation.  I last saw him on March 24, 2020 at which time he felt well.  He was unaware of any recurrent atrial fibrillation or palpitations.  He is not going up on the roof as much as he had in  the past.  Remotely palpitations would occur during periods of increased stress associated with dehydration.  He denied any chest pain PND orthopnea.  He was on metoprolol succinate 25 mg daily and lisinopril 2.5 mg for hypertension and his PAF.  He was on Vytorin 10/20 for hyperlipidemia.  He was on Metformin for type 2 diabetes mellitus with recent hemoglobin A1c at 5.9..  He continues to be on Xarelto 20 mg without bleeding.    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  Apparently, he self reduced and was only taking a half of Vytorin couple days per week with ultimate discontinuance.  Of note, he underwent laboratory on October 30 by Dr. Elease Hashimoto and his total cholesterol was 164, triglycerides had risen to 360 VLDL was 72, a direct LDL was 101.  He is unaware of any recurrent AF following his ablation in December 2020.  He continues to be on Xarelto 20 mg for hyperlipidemia.  He is on metoprolol succinate 25 mg daily in addition to lisinopril 2.5 mg for blood pressure control.  He presents for evaluation.   Past Medical History:  Diagnosis Date   Allergy    Atrial fibrillation ([place])    a. diagnosed in 01/2017 --> s/p DCCV. Started on Xarelto for anticoagulation.    CAD (coronary artery disease)    a. s/p DES to LAD in 2005 b. patent by cath in 2007 with 30% LCx stenosis noted at that time c. normal NST in 05/2011   Chronic kidney disease    1 kidney stone   Diabetes mellitus without complication ([place])    type 2, no meds needed   Hyperlipidemia    Hypertension    Myocardial infarction [facility])     Past Surgical History:  Procedure Laterality Date   ATRIAL FIBRILLATION ABLATION N/A 05/07/2019   Procedure: ATRIAL FIBRILLATION ABLATION;  Surgeon: Michael Grayer, MD;  Location: [facility];  Service: Cardiovascular;  Laterality: N/A;   CARDIAC SURGERY  2005   stent   CORONARY STENT PLACEMENT     KNEE ARTHROSCOPY     left 2003, right 2005   LEFT HEART CATH AND  CORONARY ANGIOGRAPHY N/A 12/15/2017   Procedure: LEFT HEART  CATH AND CORONARY ANGIOGRAPHY;  Surgeon: Troy Sine, MD;  Location: [facility];  Service: Cardiovascular;  Laterality: N/A;   SHOULDER SURGERY      Allergies  Allergen Reactions   Aleve [Naproxen Sodium] Swelling and Other (See Comments)    Mouth swells   Oxycodone Other (See Comments)    Rendered the patient unable to fall asleep AND he "almost passed out"   Sulfa Antibiotics Hives   Trazodone And Nefazodone Other (See Comments)    Seeing colored lights   Vicodin [Hydrocodone-Acetaminophen] Other (See Comments)    Rendered the patient unable to fall asleep AND he "almost passed out"    Current Outpatient Medications  Medication Sig Dispense Refill   ezetimibe-simvastatin (VYTORIN) 10-40 MG tablet Take 1 tablet by mouth daily. 90 tablet 2   famotidine (PEPCID) 20 MG tablet Take 1 tablet (20 mg total) by mouth daily. 90 tablet 1   lisinopril (ZESTRIL) 2.5 MG tablet Take 1 tablet (2.5 mg total) by mouth daily. 90 tablet 3   metFORMIN (GLUCOPHAGE) 500 MG tablet TAKE 1 TABLET BY MOUTH 2 TIMES DAILY WITH A MEAL. 180 tablet 1   metoprolol succinate (TOPROL-XL) 25 MG 24 hr tablet TAKE 1 TABLET (25 MG TOTAL) BY MOUTH DAILY. 90 tablet 1   Multiple Vitamins-Minerals (MULTIVITAMIN WITH MINERALS) tablet Take 1 tablet by mouth at bedtime.      nitroGLYCERIN (NITROSTAT) 0.4 MG SL tablet Place 1 tablet (0.4 mg total) under the tongue every 5 (five) minutes as needed for chest pain. 25 tablet 2   pantoprazole (PROTONIX) 40 MG tablet TAKE 1 TABLET DAILY 30 TO 60 MINUTES BEFORE FIRST MEAL OF THE DAY 90 tablet 1   PAZEO 0.7 % SOLN Place 1 drop into both eyes daily as needed (allergies).   4   sildenafil (VIAGRA) 100 MG tablet TAKE 1/2 TO 1 TABLET BY MOUTH DAILY AS NEEDED FOR ERECTILE DYSFUNCTION 15 tablet 3   trimethoprim-polymyxin b (POLYTRIM) ophthalmic solution Place 2 drops into the right eye every 4 (four) hours. 10 mL 0    XARELTO 20 MG TABS tablet TAKE 1 TABLET BY MOUTH DAILY WITH SUPPER 90 tablet 1   No current facility-administered medications for this visit.    Social History   Socioeconomic History   Marital status: Married    Spouse name: Not on file   Number of children: Not on file   Years of education: Not on file   Highest education level: Not on file  Occupational History   Not on file  Tobacco Use   Smoking status: Former    Packs/day: 1.00    Years: 8.00    Total pack years: 8.00    Types: Cigarettes    Quit date: 07/17/1987    Years since quitting: 34.7   Smokeless tobacco: Never  Vaping Use   Vaping Use: Never used  Substance and Sexual Activity   Alcohol use: No    Alcohol/week: 0.0 Livingston drinks of alcohol   Drug use: No   Sexual activity: Not on file  Other Topics Concern   Not on file  Social History Narrative   Not on file   Social Determinants of Health   Financial Resource Strain: Not on file  Food Insecurity: Not on file  Transportation Needs: Not on file  Physical Activity: Not on file  Stress: Not on file  Social Connections: Not on file  Intimate Partner Violence: Not on file    Family History  Problem Relation  Age of Onset   Diabetes Mother    Heart disease Father    Diabetes Father    Heart disease Paternal Grandfather    Colon cancer Neg Hx    Esophageal cancer Neg Hx    Stomach cancer Neg Hx    Rectal cancer Neg Hx    Social history is notable that he is married and has 2 children. There is no tobacco or alcohol use. He has an autistic son.   ROS General: Negative; No fevers, chills, or night sweats; there is purposeful weight loss HEENT: Negative; No changes in vision or hearing, sinus congestion, difficulty swallowing Pulmonary: Negative; No cough, wheezing, shortness of breath, hemoptysis Cardiovascular: See history of present illness:  GI: GERD GU: Negative; No dysuria, hematuria, or difficulty voiding Musculoskeletal: Left shoulder  labrum tear Hematologic/Oncology: Negative; no easy bruising, bleeding Endocrine: Negative; no heat/cold intolerance; no diabetes Neuro: Negative; no changes in balance, headaches Skin: Negative; No rashes or skin lesions Psychiatric: Negative; No behavioral problems, depression Sleep: Negative; No snoring, daytime sleepiness, hypersomnolence, bruxism, restless legs, hypnogognic hallucinations, no cataplexy Other comprehensive 14 point system review is negative.   PE BP 118/72   Pulse 65   Ht _0  (1.88 m)   Wt 224 lb 3.2 oz (101.7 kg)   SpO2 98%   BMI 28.79 kg/m    Repeat blood pressure by me was 126/76  Wt Readings from Last 3 Encounters:  03/23/22 224 lb 3.2 oz (101.7 kg)  03/07/22 222 lb 8 oz (100.9 kg)  06/15/21 223 lb 6.4 oz (101.3 kg)   General: Alert, oriented, no distress.  Skin: normal turgor, no rashes, warm and dry HEENT: Normocephalic, atraumatic. Pupils equal round and reactive to light; sclera anicteric; extraocular muscles intact;  Nose without nasal septal hypertrophy Mouth/Parynx benign; Mallinpatti scale Neck: No JVD, no carotid bruits; normal carotid upstroke Lungs: clear to ausculatation and percussion; no wheezing or rales Chest wall: without tenderness to palpitation Heart: PMI not displaced, RRR, s1 s2 normal, 1/6 systolic murmur, no diastolic murmur, no rubs, gallops, thrills, or heaves Abdomen: soft, nontender; no hepatosplenomehaly, BS+; abdominal aorta nontender and not dilated by palpation. Back: no CVA tenderness Pulses 2+ Musculoskeletal: full range of motion, normal strength, no joint deformities Extremities: no clubbing cyanosis or edema, Homan's sign negative  Neurologic: grossly nonfocal; Cranial nerves grossly wnl Psychologic: Normal mood and affect   March 23, 2022 ECG (independently read by me): NSR at 63, no ectopy, normal intervals  March 24, 2020 ECG (independently read by me): Normal sinus rhythm at 64 bpm.  No ectopy.   Normal intervals.  QTc interval 414 ms.  November 2020 ECG (independently read by me): NSR at 60; Normal intervals  October 18, 2017 ECG (independently read by me): Sinus rhythm at 64 bpm.  Normal intervals.  No ectopy.  March 2018 ECG (independently read by me): normal sinus rhythm at 61 bpm.  Normal intervals.  No ectopy.  No ST segment changes.  December 2018 ECG (independently read by me):  Sinus bradycardia at 55 bpm.  PR interal 168 ms.  QTc inteval 417 ms. No ST-T changes.  February 2018 ECG (independently read by me): Sinus bradycardia at 48 bpm.  No ST segment changes.  Normal intervals.  June 2017 ECG (independently read by me): Sinus bradycardia 58 bpm.  Normal intervals.  No ST segment changes.  September 2016 ECG (independently read by me): Sinus bradycardia 58 bpm.  Mild RV conduction delay.  September 2015 ECG:(  Independently read by me): Sinus bradycardia 50 beats per minute.  No ST segment changes.  Normal intervals.   November 2014 ECG: Sinus bradycardia at 45 beats per minute. No significant ST-T change.  LABS:    Latest Ref Rng & Units 03/07/2022   11:32 AM 12/02/2020    7:35 AM 02/18/2020   10:24 AM  BMP  Glucose 70 - 99 mg/dL 111  108  104   BUN 6 - 23 mg/dL _0 Creatinine 0.40 - 1.50 mg/dL 0.97  0.99  0.83   BUN/Creat Ratio 6 - 22 (calc)   NOT APPLICABLE   Sodium 471 - 145 mEq/L 134  138  138   Potassium 3.5 - 5.1 mEq/L 4.1  4.3  4.8   Chloride 96 - 112 mEq/L 100  102  102   CO2 19 - 32 mEq/L _1 Calcium 8.4 - 10.5 mg/dL 9.6  9.4  9.8       Latest Ref Rng & Units 03/07/2022   11:32 AM 12/02/2020    7:35 AM 02/18/2020   10:24 AM  Hepatic Function  Total Protein 6.0 - 8.3 g/dL 7.8  7.5  7.7   Albumin 3.5 - 5.2 g/dL 4.6  4.4    AST 0 - 37 Michael/L _2 ALT 0 - 53 Michael/L _3 Alk Phosphatase 39 - 117 Michael/L 55  52    Total Bilirubin 0.2 - 1.2 mg/dL 0.8  1.0  0.8   Bilirubin, Direct 0.0 - 0.3 mg/dL 0.2  0.2        Latest Ref Rng  & Units 03/07/2022   11:32 AM 02/18/2020   10:23 AM 09/02/2019   10:21 PM  CBC  WBC 4.0 - 10.5 K/uL 7.0  5.6  8.7   Hemoglobin 13.0 - 17.0 g/dL 14.8  15.0  14.5   Hematocrit 39.0 - 52.0 % 43.1  43.7  42.1   Platelets 150.0 - 400.0 K/uL 239.0  234  212    Lab Results  Component Value Date   MCV 91.9 03/07/2022   MCV 92.8 02/18/2020   MCV 92.3 09/02/2019   Lab Results  Component Value Date   TSH 1.71 12/05/2017   Lab Results  Component Value Date   HGBA1C 6.3 (A) 03/07/2022   Lipid Panel     Component Value Date/Time   CHOL 164 03/07/2022 1132   CHOL 123 04/12/2019 0828   CHOL 99 04/02/2013 0903   TRIG 360.0 (H) 03/07/2022 1132   TRIG 59 04/02/2013 0903   HDL 34.70 (L) 03/07/2022 1132   HDL 42 04/12/2019 0828   HDL 42 04/02/2013 0903   CHOLHDL 5 03/07/2022 1132   VLDL 72.0 (H) 03/07/2022 1132   LDLCALC 58 12/02/2020 0735   LDLCALC 78 02/18/2020 1023   LDLCALC 45 04/02/2013 0903   LDLDIRECT 101.0 03/07/2022 1132     RADIOLOGY: No results found.  IMPRESSION:  1. Essential hypertension   2. Hyperlipidemia with target LDL less than 70   3. Mixed hyperlipidemia   4. PAF (paroxysmal atrial fibrillation) [facility]): Status post atrial fibrillation ablation May 07, 2019   5. Gastroesophageal reflux disease, unspecified whether esophagitis present   6. Type 2 diabetes mellitus without complication, without long-term current use of insulin [facility])     ASSESSMENT AND PLAN: Mr. Andric Kerce is a 61 year old white male who is 18 years status post  stenting to his proximal to mid LAD in October 2005.  Catheterization in October 2007 demonstrated a widely patent LAD stent and he had mild 30% narrowing.  A nuclear perfusion study in January 2013 continued to show normal perfusion.  He has not had any recurrent anginal symptomatology.  Since 2018 he has had episodes of recurrent paroxysmal atrial fibrillation and at times palpitations were precipitated during he and stress in the  setting of dehydration.  Due to increased frequency of PAF, I ultimately referred him to Dr. Rayann Livingston and he underwent successful atrial fibrillation ablation on May 07, 2019 by Dr. Rayann Livingston.  Subsequently, he has been maintaining sinus rhythm.  He continues to be on low-dose metoprolol 25 mg daily in addition to lisinopril 2.5 mg for blood pressure and palpitations.  He continues to be on Xarelto for anticoagulation and is not having any bleeding.  Previously, he had been on Vytorin for hyperlipidemia and had derive significant benefit.  Apparently, he had reduced his dose on his own to take a half a pill and essentially has stopped taking this.  I reviewed recent laboratory done by his fourchette which now shows total cholesterol 164, triglycerides 360, HDL 34, VLDL 72, and LDL was 101.  We discussed resumption of Zetia/simvastatin combination at 10/40 mg and for him to take 1 pill daily.  I also discussed the possibility of prescription omega-3 fatty acid therapy with Vascepa or Lovaza but he would initially prefer a trial of over-the-counter fish oil.  In 3 months I will recheck a comprehensive metabolic panel, fasting lipid panel, I will also obtain an LP(a).  He is unaware of any recurrent A-fib and continues to be on Xarelto for anticoagulation.  GERD is controlled with pantoprazole.  His blood pressure today is stable on low-dose lisinopril and metoprolol succinate.  I will see him in 3 to 4 months for reevaluation or sooner as needed.   Shelva Majestic, MD  03/28/2022 1:10 PM

## 2022-03-23 NOTE — Patient Instructions (Signed)
Medication Instructions:  INCREASE Vytorin to 10-40 mg once daily  Take over the counter fish oil.    *If you need a refill on your cardiac medications before your next appointment, please call your pharmacy*   Lab Work: LIPID, LPa, CMET in 3 months  If you have labs (blood work) drawn today and your tests are completely normal, you will receive your results only by: [place] (if you have MyChart) OR A paper copy in the mail If you have any lab test that is abnormal or we need to change your treatment, we will call you to review the results.   Follow-Up: At [facility], you and your health needs are our priority.  As part of our continuing mission to provide you with exceptional heart care, we have created designated Provider Care Teams.  These Care Teams include your primary Cardiologist (physician) and Advanced Practice Providers (APPs -  Physician Assistants and Nurse Practitioners) who all work together to provide you with the care you need, when you need it.  We recommend signing up for the patient portal called "MyChart".  Sign up information is provided on this After Visit Summary.  MyChart is used to connect with patients for Virtual Visits (Telemedicine).  Patients are able to view lab/test results, encounter notes, upcoming appointments, etc.  Non-urgent messages can be sent to your provider as well.   To learn more about what you can do with MyChart, go to [email or web address].    Your next appointment:   4 month(s)  The format for your next appointment:   In Person  Provider:   Shelva Majestic, MD

## 2022-03-28 ENCOUNTER — Encounter: Payer: Self-pay | Admitting: Cardiovascular Disease

## 2022-04-11 ENCOUNTER — Ambulatory Visit: Payer: BC Managed Care – PPO | Admitting: Cardiovascular Disease

## 2022-05-12 ENCOUNTER — Other Ambulatory Visit: Payer: BC Managed Care – PPO

## 2022-05-13 ENCOUNTER — Other Ambulatory Visit: Payer: BC Managed Care – PPO

## 2022-05-14 ENCOUNTER — Telehealth: Payer: BC Managed Care – PPO | Admitting: Nurse Practitioner

## 2022-05-14 DIAGNOSIS — R6889 Other general symptoms and signs: Secondary | ICD-10-CM

## 2022-05-14 DIAGNOSIS — A09 Infectious gastroenteritis and colitis, unspecified: Secondary | ICD-10-CM

## 2022-05-14 MED ORDER — OSELTAMIVIR PHOSPHATE 75 MG PO CAPS: 75.0000 mg | ORAL_CAPSULE | Freq: Two times a day (BID) | ORAL | 0 refills | Status: DC

## 2022-05-14 NOTE — Patient Instructions (Signed)

## 2022-05-14 NOTE — Progress Notes (Signed)
Virtual Visit Consent   Michael Livingston, you are scheduled for a virtual visit with Mary-Margaret Hassell Done, FNP, a [facility] provider, today.     Just as with appointments in the office, your consent must be obtained to participate.  Your consent will be active for this visit and any virtual visit you may have with one of our providers in the next 365 days.     If you have a MyChart account, a copy of this consent can be sent to you electronically.  All virtual visits are billed to your insurance company just like a traditional visit in the office.    As this is a virtual visit, video technology does not allow for your provider to perform a traditional examination.  This may limit your provider's ability to fully assess your condition.  If your provider identifies any concerns that need to be evaluated in person or the need to arrange testing (such as labs, EKG, etc.), we will make arrangements to do so.     Although advances in technology are sophisticated, we cannot ensure that it will always work on either your end or our end.  If the connection with a video visit is poor, the visit may have to be switched to a telephone visit.  With either a video or telephone visit, we are not always able to ensure that we have a secure connection.     I need to obtain your verbal consent now.   Are you willing to proceed with your visit today? YES   Michael Livingston has provided verbal consent on 05/14/2022 for a virtual visit (video or telephone).   Mary-Margaret Hassell Done, FNP   Date: 05/14/2022 11:14 AM   Virtual Visit via Video Note   I, Mary-Margaret Hassell Done, connected with Michael Livingston ([id number], Sep 29, 1960) on 05/14/22 at 11:15 AM EST by a video-enabled telemedicine application and verified that I am speaking with the correct person using two identifiers.  Location: Patient: Virtual Visit Location Patient: Home Provider: Virtual Visit Location Provider: Mobile   I discussed the  limitations of evaluation and management by telemedicine and the availability of in person appointments. The patient expressed understanding and agreed to proceed.    History of Present Illness: Michael Livingston is a 62 y.o. who identifies as a male who was assigned male at birth, and is being seen today for flu like symptoms with diarrhea .  HPI: Diarrhea  Episode onset: at 3am. The problem occurs 5 to 10 times per day. The problem has been waxing and waning. The patient states that diarrhea awakens him from sleep. Associated symptoms include abdominal pain, a fever, headaches and myalgias. Pertinent negatives include no vomiting. Associated symptoms comments: Fever 101.4. Nothing aggravates the symptoms. There are no known risk factors. He has tried nothing for the symptoms. The treatment provided no relief.    Review of Systems  Constitutional:  Positive for fever.  Gastrointestinal:  Positive for abdominal pain and diarrhea. Negative for vomiting.  Musculoskeletal:  Positive for myalgias.  Neurological:  Positive for headaches.    Problems:  Patient Active Problem List   Diagnosis Date Noted   GERD (gastroesophageal reflux disease) 12/02/2020   Persistent atrial fibrillation ([place]) 06/11/2019   Secondary hypercoagulable state ([place]) 06/11/2019   Facial cellulitis 03/15/2019   Significant closed head trauma within past 3 months 03/15/2019   Pulmonary sarcoidosis ([place]) 12/06/2017   DOE (dyspnea on exertion) 12/05/2017   Coronary artery disease involving native coronary artery  of native heart with angina pectoris (HCC)    Paroxysmal atrial fibrillation ([place]) 01/30/2017   Preoperative clearance 17/40/8144   Dysmetabolic syndrome X 81/85/6314   Hyperlipidemia with target LDL less than 70 04/02/2013   Type 2 diabetes mellitus without complication, without long-term current use of insulin ([place]) 07/16/2012   Hyperlipidemia 07/16/2012   Coronary atherosclerosis 02/06/2009   Generalized  abdominal pain 02/06/2009    Allergies:  Allergies  Allergen Reactions   Aleve [Naproxen Sodium] Swelling and Other (See Comments)    Mouth swells   Oxycodone Other (See Comments)    Rendered the patient unable to fall asleep AND he "almost passed out"   Sulfa Antibiotics Hives   Trazodone And Nefazodone Other (See Comments)    Seeing colored lights   Vicodin [Hydrocodone-Acetaminophen] Other (See Comments)    Rendered the patient unable to fall asleep AND he "almost passed out"   Medications:  Current Outpatient Medications:    ezetimibe-simvastatin (VYTORIN) 10-40 MG tablet, Take 1 tablet by mouth daily., Disp: 90 tablet, Rfl: 2   famotidine (PEPCID) 20 MG tablet, Take 1 tablet (20 mg total) by mouth daily., Disp: 90 tablet, Rfl: 1   lisinopril (ZESTRIL) 2.5 MG tablet, Take 1 tablet (2.5 mg total) by mouth daily., Disp: 90 tablet, Rfl: 3   metFORMIN (GLUCOPHAGE) 500 MG tablet, TAKE 1 TABLET BY MOUTH 2 TIMES DAILY WITH A MEAL., Disp: 180 tablet, Rfl: 1   metoprolol succinate (TOPROL-XL) 25 MG 24 hr tablet, TAKE 1 TABLET (25 MG TOTAL) BY MOUTH DAILY., Disp: 90 tablet, Rfl: 1   Multiple Vitamins-Minerals (MULTIVITAMIN WITH MINERALS) tablet, Take 1 tablet by mouth at bedtime. , Disp: , Rfl:    nitroGLYCERIN (NITROSTAT) 0.4 MG SL tablet, Place 1 tablet (0.4 mg total) under the tongue every 5 (five) minutes as needed for chest pain., Disp: 25 tablet, Rfl: 2   pantoprazole (PROTONIX) 40 MG tablet, TAKE 1 TABLET DAILY 30 TO 60 MINUTES BEFORE FIRST MEAL OF THE DAY, Disp: 90 tablet, Rfl: 1   PAZEO 0.7 % SOLN, Place 1 drop into both eyes daily as needed (allergies). , Disp: , Rfl: 4   sildenafil (VIAGRA) 100 MG tablet, TAKE 1/2 TO 1 TABLET BY MOUTH DAILY AS NEEDED FOR ERECTILE DYSFUNCTION, Disp: 15 tablet, Rfl: 3   trimethoprim-polymyxin b (POLYTRIM) ophthalmic solution, Place 2 drops into the right eye every 4 (four) hours., Disp: 10 mL, Rfl: 0   XARELTO 20 MG TABS tablet, TAKE 1 TABLET BY MOUTH  DAILY WITH SUPPER, Disp: 90 tablet, Rfl: 1  Observations/Objective: Patient is well-developed, well-nourished in no acute distress.  Resting comfortably  at home.  Head is normocephalic, atraumatic.  No labored breathing.  Speech is clear and coherent with logical content.  Patient is alert and oriented at baseline.  Sitting in house with coat on  Assessment and Plan:  Lehman Whiteley in today with chief complaint of Diarrhea   1. Flu-like symptoms 1. Take meds as prescribed 2. Use a cool mist humidifier especially during the winter months and when heat has been humid. 3. Use saline nose sprays frequently 4. Saline irrigations of the nose can be very helpful if done frequently.  * 4X daily for 1 week*  * Use of a nettie pot can be helpful with this. Follow directions with this* 5. Drink plenty of fluids 6. Keep thermostat turn down low 7.For any cough or congestion- mucinex if needed 8. For fever or aces or pains- take tylenol or ibuprofen appropriate  for age and weight.  * for fevers greater than 101 orally you may alternate ibuprofen and tylenol every  3 hours.     2. Diarrhea of infectious origin Imodium ad OTC [place] diet No fatty foods pr spicy foods     Follow Up Instructions: I discussed the assessment and treatment plan with the patient. The patient was provided an opportunity to ask questions and all were answered. The patient agreed with the plan and demonstrated an understanding of the instructions.  A copy of instructions were sent to the patient via MyChart.  The patient was advised to call back or seek an in-person evaluation if the symptoms worsen or if the condition fails to improve as anticipated.  Time:  I spent 6 minutes with the patient via telehealth technology discussing the above problems/concerns.    Mary-Margaret Hassell Done, FNP

## 2022-05-22 ENCOUNTER — Other Ambulatory Visit: Payer: Self-pay | Admitting: Family Medicine

## 2022-07-10 ENCOUNTER — Other Ambulatory Visit: Payer: Self-pay | Admitting: Family Medicine

## 2022-07-14 ENCOUNTER — Other Ambulatory Visit: Payer: Self-pay

## 2022-07-14 DIAGNOSIS — E785 Hyperlipidemia, unspecified: Secondary | ICD-10-CM

## 2022-07-16 LAB — COMPREHENSIVE METABOLIC PANEL
ALT: 19 IU/L (ref 0–44)
AST: 20 IU/L (ref 0–40)
Albumin/Globulin Ratio: 1.7 (ref 1.2–2.2)
Albumin: 4.7 g/dL (ref 3.8–4.9)
Alkaline Phosphatase: 68 IU/L (ref 44–121)
BUN/Creatinine Ratio: 15 (ref 9–20)
BUN: 15 mg/dL (ref 6–24)
Bilirubin Total: 0.6 mg/dL (ref 0.0–1.2)
CO2: 23 mmol/L (ref 20–29)
Calcium: 9.5 mg/dL (ref 8.7–10.2)
Chloride: 100 mmol/L (ref 96–106)
Creatinine, Ser: 0.98 mg/dL (ref 0.76–1.27)
Globulin, Total: 2.8 g/dL (ref 1.5–4.5)
Glucose: 141 mg/dL — ABNORMAL HIGH (ref 70–99)
Potassium: 4.6 mmol/L (ref 3.5–5.2)
Sodium: 136 mmol/L (ref 134–144)
Total Protein: 7.5 g/dL (ref 6.0–8.5)
eGFR: 89 mL/min/{1.73_m2} (ref >59)

## 2022-07-16 LAB — LIPID PANEL
Chol/HDL Ratio: 3.3 ratio (ref 0.0–5.0)
Cholesterol, Total: 127 mg/dL (ref 100–199)
HDL: 39 mg/dL — ABNORMAL LOW (ref >39)
LDL Chol Calc (NIH): 65 mg/dL (ref 0–99)
Triglycerides: 132 mg/dL (ref 0–149)
VLDL Cholesterol Cal: 23 mg/dL (ref 5–40)

## 2022-07-16 LAB — LIPOPROTEIN A (LPA): Lipoprotein (a): 10.3 nmol/L (ref <75.0)

## 2022-07-27 ENCOUNTER — Ambulatory Visit: Payer: BC Managed Care – PPO | Admitting: Cardiovascular Disease

## 2022-08-14 ENCOUNTER — Other Ambulatory Visit: Payer: Self-pay | Admitting: Family Medicine

## 2022-08-16 ENCOUNTER — Ambulatory Visit: Payer: BC Managed Care – PPO | Admitting: Cardiovascular Disease

## 2022-08-19 ENCOUNTER — Other Ambulatory Visit: Payer: Self-pay | Admitting: Family Medicine

## 2022-08-19 DIAGNOSIS — R0609 Other forms of dyspnea: Secondary | ICD-10-CM

## 2022-08-21 ENCOUNTER — Other Ambulatory Visit: Payer: Self-pay | Admitting: Family Medicine

## 2022-09-27 ENCOUNTER — Ambulatory Visit: Payer: BC Managed Care – PPO | Attending: Cardiovascular Disease | Admitting: Cardiovascular Disease

## 2022-09-27 ENCOUNTER — Encounter: Payer: Self-pay | Admitting: Cardiovascular Disease

## 2022-09-27 VITALS — BP 128/84 | HR 58 | Ht 74.0 in | Wt 221.6 lb

## 2022-09-27 DIAGNOSIS — E119 Type 2 diabetes mellitus without complications: Secondary | ICD-10-CM

## 2022-09-27 DIAGNOSIS — Z7984 Long term (current) use of oral hypoglycemic drugs: Secondary | ICD-10-CM

## 2022-09-27 DIAGNOSIS — K219 Gastro-esophageal reflux disease without esophagitis: Secondary | ICD-10-CM

## 2022-09-27 DIAGNOSIS — I480 Paroxysmal atrial fibrillation: Secondary | ICD-10-CM

## 2022-09-27 DIAGNOSIS — I2510 Atherosclerotic heart disease of native coronary artery without angina pectoris: Secondary | ICD-10-CM

## 2022-09-27 DIAGNOSIS — E782 Mixed hyperlipidemia: Secondary | ICD-10-CM

## 2022-09-27 DIAGNOSIS — I10 Essential (primary) hypertension: Secondary | ICD-10-CM | POA: Diagnosis not present

## 2022-09-27 MED ORDER — ICOSAPENT ETHYL 1 G PO CAPS: 1.0000 g | ORAL_CAPSULE | Freq: Two times a day (BID) | ORAL | 3 refills | Status: AC

## 2022-09-27 NOTE — Patient Instructions (Addendum)
Medication Instructions:  Your physician has recommended you make the following change in your medication:  START: Vascepa 1 g capsule once daily  *If you need a refill on your cardiac medications before your next appointment, please call your pharmacy*   Lab Work: None   Testing/Procedures: None   Follow-Up: At [facility], you and your health needs are our priority.  As part of our continuing mission to provide you with exceptional heart care, we have created designated Provider Care Teams.  These Care Teams include your primary Cardiologist (physician) and Advanced Practice Providers (APPs -  Physician Assistants and Nurse Practitioners) who all work together to provide you with the care you need, when you need it.    Your next appointment:   9 month(s)  Provider:   Nicki Gu[place], MD

## 2022-09-27 NOTE — Progress Notes (Signed)
Patient ID: Michael Livingston, male   DOB: 09-27-1960, 62 y.o.   MRN: [id number]        HPI: Michael Livingston is a 62 y.o. male presents to the office for a 23-month follow-up cardiology evaluation.  Michael Livingston has CAD and in October 2005 underwent stenting to his proximal to mid left anterior descending artery. His last catheterization was in October 2007 and his LAD was  was widely patent. He did have mild 30% circumflex stenosis. A nuclear perfusion study in January 2013 continued to show normal perfusion.  Michael Livingston has a history of palpitations which have been controlled with beta blocker therapy. He has a history of hyperlipidemia with an atherogenic lipid panel with low HDL levels. An NMR lipoprofile in 2014 demonstrated increased insulin resistance and an increased wall LDL particles.  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 Since that time, he has lost over 20 pounds. He denies recent chest pain. He denies significant increase in palpitations except a rare episode.   He is no longer is working for [employer], but is now owns a [employer].  He denies any exertional chest pain or dyspnea.  He denies palpitations.  He denies PND, orthopnea.  Last year he underwent orthopedic surgery to his shoulder and tolerated this well.    He was started on metformin for diabetes mellitus.  In June 2017 hemoglobin A1c was 6.4, and most recently in January 2018  improved to 5.7.  I last saw him, he did note mild shortness of breath with arm raising and denied exertional chest pain or palpitations.  He was on aspirin and Plavix for dual antiplatelet therapy.  He was on ramipril 5 mg, metoprolol 25 mg twice a day for hypertension and Vytorin 1044.  Hyperlipidemia.  Heme notes mild shortness of breath when he raises his arms.  He denies exertional chest pain symptoms or exertional dyspnea.  He continues to be on  aspirin and Plavix for dual antiplatelet therapy.  He continues to be on ram a pro-5 mg, metoprolol 25 mg twice a day for hypertension.  He is on Vytorin 10/40 for hyperlipidemia, as well as lovaza.  Since I last saw him, he developed new onset atrial fibrillation on 01/19/2017.  He went to [facility].  CBC be met were normal as was his TSH.  He underwent successful DC cardioversion at 150 J with restoration of sinus rhythm.  His Lopressor dose was increased to 30 mg twice a day, and he was started on Xarelto 20 mg for a cha2ds2score of 3 (CAD, hypertension, diabetes mellitus.  His Plavix was discontinued.  He saw Randall An for office follow-up.  He was doing well without recurrence.  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  The month previously, he had significantly banged his head on a piling intrahepatic and as result was very concerned about work-related trauma, particularly while being on Xarelto. He was unaware of any recurrent episodes of arrhythmia.  We had a lengthy discussion about risk-benefit ratio of anticoagulation, particularly with his job.  He preferred not continue Xarelto due to his job risk.  As result, Xarelto was discontinued and he was resumed back on aspirin 81 mg and Plavix 75 mg. I increased metoprolol recommended he continue to take lisinopril 20 mg daily.  If he were to develop any recurrent episodes of AFeinitiation of anticoagulation would be necessary.  When I saw him in the office in March 2019 he apparently was only taking metoprolol tartrate once a day.  As result, I recommended switching him to metoprolol succinate to allow for improved sustained action.  He had been doing well was unaware of any arrhythmias until October 10, 2017.  It was extremely hot, he was working on a roof, he was dehydrated, and he noticed his heart rate speeding up.  He quickly got off the roof.  He was seen by  Dr. Christell Constant and presented to [facility] ER where he was found to be in AF with ventricular rate in the 150s to 160s.  I saw him in the emergency room and in the ER he underwent successful cardioversion with restoration of sinus rhythm.  At that time he was advised to increase metoprolol succinate to 37.5 mg and instead of taking the medication at bedtime to take it in the morning.  He was also given a prescription for short acting diltiazem 30 mg to take on a as needed basis.  I saw him in the office 1 week later on October 18, 2017 at which time he was doing well and was not experiencing any recurrent arrhythmia.    He was seen as an add-on in the office on December 11, 2017 by Micah Flesher, [place].  At that time he had complaints of shortness of breath with activity as well as vague chest fullness and tightness with minimal work.  He was set up for repeat cardiac catheterization by me on December 15, 2017.  This did not reveal any significant residual CAD and he had overlapping stents in the proximal to mid LAD with smooth intimal hyperplasia of 20%.  He had a normal ramus intermediate, dominant left circumflex nondominant RCA.  Medical therapy was recommended and if he had recurrent PAF consider potential candidacy for ablation.  He was restarted back on Xarelto.  He was seen for hospital follow-up with Micah Flesher, PA on January 02, 2018 and he was doing well.  He has occasional chest pain twinges had largely resolved with Protonix.  I saw him on July 26, 2018 at which time he was feeling well and denied any recurrent episodes of arrhythmia.  He had previously been evaluated on  on June 18, 2018 by Joni Reining, nurse practitioner because of concerns for possible blood pressure elevation with monitoring of his home blood pressure unit.  Lisinopril was initiated.  During my evaluation he was on lisinopril 5 mg, metoprolol succinate 37.5 mg daily and has a prescription for diltiazem 30 mg to take as needed if recurrent  palpitations.  He  not required any additional diltiazem supplementation.  He continued to take Protonix for GERD.  He is on Xarelto for anticoagulation.  He continued to be on Vytorin 10/40 for hyperlipidemia.    Michael Livingston states that he had done fairly well without recurrent arrhythmia until March 07, 2019.  On that day, he had taken his father to our office and he had a syncopal spell outside while waiting for the elevator.  As result, only had to take his father to the emergency room and ultimately he required a permanent pacemaker.  However later that night upon coming home from the hospital he was eating dinner late and developed recurrent tachycardia.  He had taken diltiazem 30 mg x 2 without benefit and ultimately presented to the emergency room where  again he was found to be in atrial fibrillation.  Since he was anticoagulated on Xarelto he underwent another successful cardioversion in the emergency room with restoration of sinus rhythm.  He denied any associated anginal symptomatology.  He has continued to take metoprolol succinate 37.5 mg daily, lisinopril 2.5 mg, and is on ezetimibe simvastatin 10/20.  He continues to be on Xarelto 20 mg without bleeding.    At his evaluation with me in November 2020 I referred him to Dr. Johney Frame for consideration of ablation and on May 07, 2019 underwent atrial fibrillation ablation with successful electrical isolation and anatomical encircling of all 4 pulmonary veins.  He was seen by Azalee Course, PA in follow-up in May 2021 which time he continued to do well.  He continued on very low-dose metoprolol succinate 25 mg as well as Xarelto.  He was not having any chest pain.  He was seen by Francis Dowse in EP clinic in July.  He was stable with reference to his prior ablation.  I saw him on March 24, 2020 at which time he felt well.  He was unaware of any recurrent atrial fibrillation or palpitations.  He is not going up on the roof as much as he had in the  past.  Remotely palpitations would occur during periods of increased stress associated with dehydration.  He denied any chest pain PND orthopnea.  He was on metoprolol succinate 25 mg daily and lisinopril 2.5 mg for hypertension and his PAF.  He was on Vytorin 10/20 for hyperlipidemia.  He was on Metformin for type 2 diabetes mellitus with recent hemoglobin A1c at 5.9..  He continues to be on Xarelto 20 mg without bleeding.    I last saw him on March 23, 2022.  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 was only taking a half of Vytorin couple days per week with ultimate discontinuance.  Of note, he underwent laboratory on October 30 by Dr. Caryl Never and his total cholesterol was 164, triglycerides had risen to 360 VLDL was 72, a direct LDL was 101.  He is unaware of any recurrent AF following his ablation in December 2020.  He continued to be on Xarelto 20 mg for hyperlipidemia.  He is on metoprolol succinate 25 mg daily in addition to lisinopril 2.5 mg for blood pressure control.  During that evaluation, in light of his elevated triglycerides I recommend resumption of Zetia/simvastatin combination at 1040 and also discussed omega-3 fatty acid therapy with either Vascepa or Lovaza but initially he wanted to try over-the-counter medication.  I discussed majors boundaries 1400 mg of with 980 mg is of EPA and DHA.  Since I last saw him, he has continued to be stable.  He is unaware of any abnormal heart rhythm.  He is now on lisinopril 2.5 mg, metoprolol succinate 25 mg daily for hypertension.  He is back on Zetia/simvastatin 10/40 mg for hyperlipidemia and has been taking over-the-counter fish oil.  He is diabetic on metformin 500 mg twice a day.  He is anticoagulated on Xarelto and has been maintaining sinus rhythm.  He is on pantoprazole for GERD.  He presents for evaluation.  Past Medical History:  Diagnosis Date   Allergy     Atrial fibrillation (HCC)    a. diagnosed in 01/2017 --> s/p DCCV. Started on Xarelto for anticoagulation.    CAD (coronary artery disease)    a.  s/p DES to LAD in 2005 b. patent by cath in 2007 with 30% LCx stenosis noted at that time c. normal NST in 05/2011   Chronic kidney disease    1 kidney stone   Diabetes mellitus without complication (HCC)    type 2, no meds needed   Hyperlipidemia    Hypertension    Myocardial infarction [facility])     Past Surgical History:  Procedure Laterality Date   ATRIAL FIBRILLATION ABLATION N/A 05/07/2019   Procedure: ATRIAL FIBRILLATION ABLATION;  Surgeon: Hillis Range, MD;  Location: [facility] INVASIVE CV LAB;  Service: Cardiovascular;  Laterality: N/A;   CARDIAC SURGERY  2005   stent   CORONARY STENT PLACEMENT     KNEE ARTHROSCOPY     left 2003, right 2005   LEFT HEART CATH AND CORONARY ANGIOGRAPHY N/A 12/15/2017   Procedure: LEFT HEART CATH AND CORONARY ANGIOGRAPHY;  Surgeon: Lennette Bihari, MD;  Location: [facility] INVASIVE CV LAB;  Service: Cardiovascular;  Laterality: N/A;   SHOULDER SURGERY      Allergies  Allergen Reactions   Aleve [Naproxen Sodium] Swelling and Other (See Comments)    Mouth swells   Oxycodone Other (See Comments)    Rendered the patient unable to fall asleep AND he "almost passed out"   Sulfa Antibiotics Hives   Trazodone And Nefazodone Other (See Comments)    Seeing colored lights   Vicodin [Hydrocodone-Acetaminophen] Other (See Comments)    Rendered the patient unable to fall asleep AND he "almost passed out"    Current Outpatient Medications  Medication Sig Dispense Refill   ezetimibe-simvastatin (VYTORIN) 10-40 MG tablet Take 1 tablet by mouth daily. 90 tablet 2   famotidine (PEPCID) 20 MG tablet TAKE 1 TABLET BY MOUTH EVERY DAY 90 tablet 1   icosapent Ethyl (VASCEPA) 1 g capsule Take 1 capsule (1 g total) by mouth 2 (two) times daily. 180 capsule 3   lisinopril (ZESTRIL) 2.5 MG tablet TAKE 1 TABLET BY MOUTH EVERY DAY 90  tablet 1   metFORMIN (GLUCOPHAGE) 500 MG tablet TAKE 1 TABLET BY MOUTH TWICE A DAY WITH FOOD 180 tablet 1   metoprolol succinate (TOPROL-XL) 25 MG 24 hr tablet TAKE 1 TABLET (25 MG TOTAL) BY MOUTH DAILY. 90 tablet 0   Multiple Vitamins-Minerals (MULTIVITAMIN WITH MINERALS) tablet Take 1 tablet by mouth at bedtime.      pantoprazole (PROTONIX) 40 MG tablet TAKE 1 TABLET DAILY 30 TO 60 MINUTES BEFORE FIRST MEAL OF THE DAY 90 tablet 1   PAZEO 0.7 % SOLN Place 1 drop into both eyes daily as needed (allergies).   4   sildenafil (VIAGRA) 100 MG tablet TAKE 1/2 TO 1 TABLET BY MOUTH DAILY AS NEEDED FOR ERECTILE DYSFUNCTION 15 tablet 3   trimethoprim-polymyxin b (POLYTRIM) ophthalmic solution Place 2 drops into the right eye every 4 (four) hours. 10 mL 0   XARELTO 20 MG TABS tablet TAKE 1 TABLET BY MOUTH DAILY WITH SUPPER 90 tablet 0   nitroGLYCERIN (NITROSTAT) 0.4 MG SL tablet Place 1 tablet (0.4 mg total) under the tongue every 5 (five) minutes as needed for chest pain. (Patient not taking: Reported on 09/27/2022) 25 tablet 2   oseltamivir (TAMIFLU) 75 MG capsule Take 1 capsule (75 mg total) by mouth 2 (two) times daily. (Patient not taking: Reported on 09/27/2022) 10 capsule 0   No current facility-administered medications for this visit.    Social History   Socioeconomic History   Marital status: Married    Spouse  name: Not on file   Number of children: Not on file   Years of education: Not on file   Highest education level: Not on file  Occupational History   Not on file  Tobacco Use   Smoking status: Former    Packs/day: 1.00    Years: 8.00    Additional pack years: 0.00    Total pack years: 8.00    Types: Cigarettes    Quit date: 07/17/1987    Years since quitting: 35.2   Smokeless tobacco: Never  Vaping Use   Vaping Use: Never used  Substance and Sexual Activity   Alcohol use: No    Alcohol/week: 0.0 standard drinks of alcohol   Drug use: No   Sexual activity: Not on file  Other  Topics Concern   Not on file  Social History Narrative   Not on file   Social Determinants of Health   Financial Resource Strain: Not on file  Food Insecurity: Not on file  Transportation Needs: Not on file  Physical Activity: Not on file  Stress: Not on file  Social Connections: Not on file  Intimate Partner Violence: Not on file    Family History  Problem Relation Age of Onset   Diabetes Mother    Heart disease Father    Diabetes Father    Heart disease Paternal Grandfather    Colon cancer Neg Hx    Esophageal cancer Neg Hx    Stomach cancer Neg Hx    Rectal cancer Neg Hx    Social history is notable that he is married and has 2 children. There is no tobacco or alcohol use. He has an autistic son.   ROS General: Negative; No fevers, chills, or night sweats; there is purposeful weight loss HEENT: Negative; No changes in vision or hearing, sinus congestion, difficulty swallowing Pulmonary: Negative; No cough, wheezing, shortness of breath, hemoptysis Cardiovascular: See history of present illness:  GI: GERD GU: Negative; No dysuria, hematuria, or difficulty voiding Musculoskeletal: Left shoulder labrum tear Hematologic/Oncology: Negative; no easy bruising, bleeding Endocrine: Negative; no heat/cold intolerance; no diabetes Neuro: Negative; no changes in balance, headaches Skin: Negative; No rashes or skin lesions Psychiatric: Negative; No behavioral problems, depression Sleep: Negative; No snoring, daytime sleepiness, hypersomnolence, bruxism, restless legs, hypnogognic hallucinations, no cataplexy Other comprehensive 14 point system review is negative.   PE BP 128/84   Pulse (!) 58   Ht 6\' 2"  (1.88 m)   Wt 221 lb 9.6 oz (100.5 kg)   SpO2 98%   BMI 28.45 kg/m    Repeat blood pressure by me was 124/80  Wt Readings from Last 3 Encounters:  09/27/22 221 lb 9.6 oz (100.5 kg)  03/23/22 224 lb 3.2 oz (101.7 kg)  03/07/22 222 lb 8 oz (100.9 kg)    General:  Alert, oriented, no distress.  Skin: normal turgor, no rashes, warm and dry HEENT: Normocephalic, atraumatic. Pupils equal round and reactive to light; sclera anicteric; extraocular muscles intact;  Nose without nasal septal hypertrophy Mouth/Parynx benign; Mallinpatti scale 3 Neck: No JVD, no carotid bruits; normal carotid upstroke Lungs: clear to ausculatation and percussion; no wheezing or rales Chest wall: without tenderness to palpitation Heart: PMI not displaced, RRR, s1 s2 normal, 1/6 systolic murmur, no diastolic murmur, no rubs, gallops, thrills, or heaves Abdomen: soft, nontender; no hepatosplenomehaly, BS+; abdominal aorta nontender and not dilated by palpation. Back: no CVA tenderness Pulses 2+ Musculoskeletal: full range of motion, normal strength, no joint deformities Extremities: no clubbing  cyanosis or edema, Homan's sign negative  Neurologic: grossly nonfocal; Cranial nerves grossly wnl Psychologic: Normal mood and affect    Sep 27, 2022 ECG (independently read by me): Sinus bradycardia at 58  March 23, 2022 ECG (independently read by me): NSR at 63, no ectopy, normal intervals  March 24, 2020 ECG (independently read by me): Normal sinus rhythm at 64 bpm.  No ectopy.  Normal intervals.  QTc interval 414 ms.  November 2020 ECG (independently read by me): NSR at 60; Normal intervals  October 18, 2017 ECG (independently read by me): Sinus rhythm at 64 bpm.  Normal intervals.  No ectopy.  March 2018 ECG (independently read by me): normal sinus rhythm at 61 bpm.  Normal intervals.  No ectopy.  No ST segment changes.  December 2018 ECG (independently read by me):  Sinus bradycardia at 55 bpm.  PR interal 168 ms.  QTc inteval 417 ms. No ST-T changes.  February 2018 ECG (independently read by me): Sinus bradycardia at 48 bpm.  No ST segment changes.  Normal intervals.  June 2017 ECG (independently read by me): Sinus bradycardia 58 bpm.  Normal intervals.  No ST segment  changes.  September 2016 ECG (independently read by me): Sinus bradycardia 58 bpm.  Mild RV conduction delay.  September 2015 ECG:( Independently read by me): Sinus bradycardia 50 beats per minute.  No ST segment changes.  Normal intervals.   November 2014 ECG: Sinus bradycardia at 45 beats per minute. No significant ST-T change.  LABS:    Latest Ref Rng & Units 07/14/2022    8:29 AM 03/07/2022   11:32 AM 12/02/2020    7:35 AM  BMP  Glucose 70 - 99 mg/dL 563  875  643   BUN 6 - 24 mg/dL 15  20  18    Creatinine 0.76 - 1.27 mg/dL 3.29  5.18  8.41   BUN/Creat Ratio 9 - 20 15     Sodium 134 - 144 mmol/L 136  134  138   Potassium 3.5 - 5.2 mmol/L 4.6  4.1  4.3   Chloride 96 - 106 mmol/L 100  100  102   CO2 20 - 29 mmol/L 23  26  27    Calcium 8.7 - 10.2 mg/dL 9.5  9.6  9.4       Latest Ref Rng & Units 07/14/2022    8:29 AM 03/07/2022   11:32 AM 12/02/2020    7:35 AM  Hepatic Function  Total Protein 6.0 - 8.5 g/dL 7.5  7.8  7.5   Albumin 3.8 - 4.9 g/dL 4.7  4.6  4.4   AST 0 - 40 IU/L 20  19  19    ALT 0 - 44 IU/L 19  18  17    Alk Phosphatase 44 - 121 IU/L 68  55  52   Total Bilirubin 0.0 - 1.2 mg/dL 0.6  0.8  1.0   Bilirubin, Direct 0.0 - 0.3 mg/dL  0.2  0.2       Latest Ref Rng & Units 03/07/2022   11:32 AM 02/18/2020   10:23 AM 09/02/2019   10:21 PM  CBC  WBC 4.0 - 10.5 K/uL 7.0  5.6  8.7   Hemoglobin 13.0 - 17.0 g/dL 66.0  63.0  16.0   Hematocrit 39.0 - 52.0 % 43.1  43.7  42.1   Platelets 150.0 - 400.0 K/uL 239.0  234  212    Lab Results  Component Value Date   MCV 91.9 03/07/2022  MCV 92.8 02/18/2020   MCV 92.3 09/02/2019   Lab Results  Component Value Date   TSH 1.71 12/05/2017   Lab Results  Component Value Date   HGBA1C 6.3 (A) 03/07/2022   Lipid Panel     Component Value Date/Time   CHOL 127 07/14/2022 0829   CHOL 99 04/02/2013 0903   TRIG 132 07/14/2022 0829   TRIG 59 04/02/2013 0903   HDL 39 (L) 07/14/2022 0829   HDL 42 04/02/2013 0903   CHOLHDL  3.3 07/14/2022 0829   CHOLHDL 5 03/07/2022 1132   VLDL 72.0 (H) 03/07/2022 1132   LDLCALC 65 07/14/2022 0829   LDLCALC 78 02/18/2020 1023   LDLCALC 45 04/02/2013 0903   LDLDIRECT 101.0 03/07/2022 1132     RADIOLOGY: No results found.  IMPRESSION:  1. Essential hypertension   2. CAD in native artery: s/p LAD stent, October 2005   3. Mixed hyperlipidemia   4. PAF (paroxysmal atrial fibrillation) [facility]): Status post atrial fibrillation ablation May 07, 2019   5. Type 2 diabetes mellitus without complication, without long-term current use of insulin (HCC)   6. Gastroesophageal reflux disease, unspecified whether esophagitis present     ASSESSMENT AND PLAN: [name] is a 62 year old white male who is 19 years status post stenting to his proximal to mid LAD in October 2005.  Catheterization in October 2007 demonstrated a widely patent LAD stent and he had mild 30% narrowing.  A nuclear perfusion study in January 2013 continued to show normal perfusion.  He has not had any recurrent anginal symptomatology.  Commencing in 2018 he had episodes of recurrent paroxysmal atrial fibrillation and at times palpitations were precipitated during he and stress in the setting of dehydration.  Due to increased frequency of PAF, I ultimately referred him to Dr. Johney Frame and he underwent successful atrial fibrillation ablation on May 07, 2019 by Dr. Johney Frame.  Subsequently, he has been maintaining sinus rhythm.  His blood pressure today is stable on lisinopril 2.5 mg and metoprolol succinate 25 mg daily.  He has not had any recurrent A-fib.  He is on Xarelto for anticoagulation.  When I last saw him, I reinitiated generic Vytorin 10/40 and recommended he also reinitiate omega-3 fatty acid.  He subsequently underwent laboratory which showed definitive improvement with laboratory on July 15, 2022 showing total cholesterol 127, triglycerides now at 132, HDL 39, VLDL 23, and LDL cholesterol at 65.  I have  given him a prescription for Vascepa 1 capsule twice a day.  I had checked his LP(a) which was normal at 10.3.  He continues to be on pantoprazole for GERD.  He is on metformin 500 mg twice a day for diabetes mellitus.  He will be following up with Dr. Smitty Livingston percent for primary care.  As long as he is stable, I will see him next spring 2025 for follow-up evaluation.    Nicki Guadalajara, MD  10/03/2022 7:21 PM

## 2022-10-03 ENCOUNTER — Encounter: Payer: Self-pay | Admitting: Cardiovascular Disease

## 2022-11-17 ENCOUNTER — Ambulatory Visit: Payer: BC Managed Care – PPO | Admitting: Family Medicine

## 2022-11-17 ENCOUNTER — Encounter: Payer: Self-pay | Admitting: Family Medicine

## 2022-11-17 VITALS — BP 116/82 | HR 67 | Temp 98.4°F | Wt 221.8 lb

## 2022-11-17 DIAGNOSIS — H60543 Acute eczematoid otitis externa, bilateral: Secondary | ICD-10-CM | POA: Diagnosis not present

## 2022-11-17 DIAGNOSIS — H6122 Impacted cerumen, left ear: Secondary | ICD-10-CM | POA: Diagnosis not present

## 2022-11-17 MED ORDER — CLOBETASOL PROPIONATE 0.05 % EX LOTN
TOPICAL_LOTION | CUTANEOUS | 0 refills | Status: DC
Start: 2022-11-17 — End: 2023-06-28

## 2022-11-17 NOTE — Patient Instructions (Signed)
A prescription for clobetasol lotion was sent to your pharmacy.  You can use a very small amount up to twice a day for up to 2 wks for the dried skin in your ears.

## 2022-11-17 NOTE — Progress Notes (Signed)
Established Patient Office Visit   Subjective  Patient ID: Michael Livingston, male    DOB: [birth year]  Age: 62 y.o. MRN: [id number]  Chief Complaint  Patient presents with   Ear Pain    Ear is a mess, wife said it needs to be cleaned. Started getting bad a couple days ago.  Patient accompanied by his son Michael Livingston.  Patient is a 62 year old male followed by Dr. Caryl Never and seen for acute concern.  Patient endorses history of dry skin and wax in ears.  Often has to see Dr. Suszanne Conners, ENT to have ears cleaned.  Patient endorses left ear feeling clogged times several days.  Tries to use washcloth to clean ears.  Had a prescription cream that he would use in ears but does not recall the name.  Patient notes history of tinnitus.    Patient Active Problem List   Diagnosis Date Noted   GERD (gastroesophageal reflux disease) 12/02/2020   Persistent atrial fibrillation (HCC) 06/11/2019   Secondary hypercoagulable state (HCC) 06/11/2019   Facial cellulitis 03/15/2019   Significant closed head trauma within past 3 months 03/15/2019   Pulmonary sarcoidosis (HCC) 12/06/2017   DOE (dyspnea on exertion) 12/05/2017   Coronary artery disease involving native coronary artery of native heart with angina pectoris (HCC)    Paroxysmal atrial fibrillation (HCC) 01/30/2017   Preoperative clearance 01/24/2015   Dysmetabolic syndrome X 04/02/2013   Hyperlipidemia with target LDL less than 70 04/02/2013   Type 2 diabetes mellitus without complication, without long-term current use of insulin (HCC) 07/16/2012   Hyperlipidemia 07/16/2012   Coronary atherosclerosis 02/06/2009   Generalized abdominal pain 02/06/2009   Past Surgical History:  Procedure Laterality Date   ATRIAL FIBRILLATION ABLATION N/A 05/07/2019   Procedure: ATRIAL FIBRILLATION ABLATION;  Surgeon: Hillis Range, MD;  Location: [facility];  Service: Cardiovascular;  Laterality: N/A;   CARDIAC SURGERY  2005   stent   CORONARY STENT  PLACEMENT     KNEE ARTHROSCOPY     left 2003, right 2005   LEFT HEART CATH AND CORONARY ANGIOGRAPHY N/A 12/15/2017   Procedure: LEFT HEART CATH AND CORONARY ANGIOGRAPHY;  Surgeon: Lennette Bihari, MD;  Location: [facility];  Service: Cardiovascular;  Laterality: N/A;   SHOULDER SURGERY     Social History   Tobacco Use   Smoking status: Former    Current packs/day: 0.00    Average packs/day: 1 pack/day for 8.0 years (8.0 ttl pk-yrs)    Types: Cigarettes    Start date: 07/17/1979    Quit date: 07/17/1987    Years since quitting: 35.3   Smokeless tobacco: Never  Vaping Use   Vaping status: Never Used  Substance Use Topics   Alcohol use: No    Alcohol/week: 0.0 standard drinks of alcohol   Drug use: No   Family History  Problem Relation Age of Onset   Diabetes Mother    Heart disease Father    Diabetes Father    Heart disease Paternal Grandfather    Colon cancer Neg Hx    Esophageal cancer Neg Hx    Stomach cancer Neg Hx    Rectal cancer Neg Hx    Allergies  Allergen Reactions   Aleve [Naproxen Sodium] Swelling and Other (See Comments)    Mouth swells   Oxycodone Other (See Comments)    Rendered the patient unable to fall asleep AND he "almost passed out"   Sulfa Antibiotics Hives   Trazodone And Nefazodone Other (  See Comments)    Seeing colored lights   Vicodin [Hydrocodone-Acetaminophen] Other (See Comments)    Rendered the patient unable to fall asleep AND he "almost passed out"      ROS Negative unless stated above    Objective:     BP 116/82 (BP Location: Left Arm, Patient Position: Sitting, Cuff Size: Large)   Pulse 67   Temp 98.4 F (36.9 C) (Oral)   Wt 221 lb 12.8 oz (100.6 kg)   SpO2 97%   BMI 28.48 kg/m    Physical Exam Constitutional:      Appearance: Normal appearance.  HENT:     Head: Normocephalic and atraumatic.     Left Ear: There is impacted cerumen.     Ears:     Comments: Dried, flaking skin of external ears and bilateral  canals.  Right canal with scant cerumen and skin debris.  Right TM normal.  View of left TM obstructed by impaction.  Left canal rrigated.  Left TM normal.    Nose: Nose normal.     Mouth/Throat:     Mouth: Mucous membranes are moist.  Cardiovascular:     Rate and Rhythm: Normal rate.  Pulmonary:     Effort: Pulmonary effort is normal.  Skin:    General: Skin is warm and dry.  Neurological:     Mental Status: He is alert and oriented to person, place, and time.    No results found for any visits on 11/17/22.    Assessment & Plan:  Impacted cerumen of left ear  Eczema of both external ears -     Clobetasol Propionate; Apply sparingly to skin of affected area twice daily up to 2 weeks consecutively.  Dispense: 59 mL; Refill: 0  Consent obtained.  Left ear irrigated.  Patient tolerated procedure well.  Left TM normal after irrigation.  Clobetasol solution for dried skin.  Return if symptoms worsen or fail to improve.   Deeann Saint, MD

## 2022-11-20 ENCOUNTER — Other Ambulatory Visit: Payer: Self-pay | Admitting: Family Medicine

## 2022-12-25 ENCOUNTER — Other Ambulatory Visit: Payer: Self-pay | Admitting: Cardiovascular Disease

## 2022-12-26 ENCOUNTER — Other Ambulatory Visit: Payer: Self-pay | Admitting: Family Medicine

## 2023-01-01 ENCOUNTER — Other Ambulatory Visit: Payer: Self-pay | Admitting: Family Medicine

## 2023-01-02 ENCOUNTER — Telehealth: Payer: Self-pay | Admitting: Family Medicine

## 2023-01-02 MED ORDER — RIVAROXABAN 20 MG PO TABS: 20.0000 mg | ORAL_TABLET | Freq: Every day | ORAL | 0 refills | Status: DC

## 2023-01-02 NOTE — Telephone Encounter (Signed)
Rx sent 

## 2023-01-02 NOTE — Telephone Encounter (Signed)
Patient called is out of the following:  XARELTO 20 MG TABS tablet   [id number] - [place], [place] - [address] AT [place] [address] Phone: [phone]  Fax: [phone]

## 2023-01-28 ENCOUNTER — Telehealth: Payer: BC Managed Care – PPO | Admitting: Family Medicine

## 2023-01-28 DIAGNOSIS — U071 COVID-19: Secondary | ICD-10-CM | POA: Diagnosis not present

## 2023-01-28 MED ORDER — MOLNUPIRAVIR EUA 200MG CAPSULE
4.0000 | ORAL_CAPSULE | Freq: Two times a day (BID) | ORAL | 0 refills | Status: AC
Start: 2023-01-28 — End: 2023-02-02

## 2023-01-28 MED ORDER — FLUTICASONE PROPIONATE 50 MCG/ACT NA SUSP
1.0000 | Freq: Every day | NASAL | 0 refills | Status: DC
Start: 2023-01-28 — End: 2023-06-28

## 2023-01-28 MED ORDER — GUAIFENESIN 200 MG PO TABS
200.0000 mg | ORAL_TABLET | ORAL | 0 refills | Status: AC | PRN
Start: 2023-01-28 — End: 2023-02-04

## 2023-01-28 NOTE — Patient Instructions (Signed)
Sunny Schlein, thank you for joining Reed Pandy, PA-C for today's virtual visit.  While this provider is not your primary care provider (PCP), if your PCP is located in our provider database this encounter information will be shared with them immediately following your visit.   A [facility] MyChart account gives you access to today's visit and all your visits, tests, and labs performed at [facility] " click here if you don't have a [facility] MyChart account or go to [email or web address]  Consent: (Patient) Michael Livingston provided verbal consent for this virtual visit at the beginning of the encounter.  Current Medications:  Current Outpatient Medications:    fluticasone (FLONASE) 50 MCG/ACT nasal spray, Place 1 spray into both nostrils daily., Disp: 16 g, Rfl: 0   guaiFENesin 200 MG tablet, Take 1 tablet (200 mg total) by mouth every 4 (four) hours as needed for up to 7 days for cough or to loosen phlegm., Disp: 30 tablet, Rfl: 0   molnupiravir EUA (LAGEVRIO) 200 mg CAPS capsule, Take 4 capsules (800 mg total) by mouth 2 (two) times daily for 5 days., Disp: 40 capsule, Rfl: 0   Clobetasol Propionate 0.05 % lotion, Apply sparingly to skin of affected area twice daily up to 2 weeks consecutively., Disp: 59 mL, Rfl: 0   ezetimibe-simvastatin (VYTORIN) 10-40 MG tablet, TAKE 1 TABLET BY MOUTH EVERY DAY, Disp: 90 tablet, Rfl: 2   famotidine (PEPCID) 20 MG tablet, TAKE 1 TABLET BY MOUTH EVERY DAY, Disp: 90 tablet, Rfl: 0   icosapent Ethyl (VASCEPA) 1 g capsule, Take 1 capsule (1 g total) by mouth 2 (two) times daily., Disp: 180 capsule, Rfl: 3   lisinopril (ZESTRIL) 2.5 MG tablet, TAKE 1 TABLET BY MOUTH EVERY DAY, Disp: 90 tablet, Rfl: 1   metFORMIN (GLUCOPHAGE) 500 MG tablet, TAKE 1 TABLET BY MOUTH TWICE A DAY WITH FOOD, Disp: 180 tablet, Rfl: 1   metoprolol succinate (TOPROL-XL) 25 MG 24 hr tablet, TAKE 1 TABLET (25 MG TOTAL) BY MOUTH DAILY., Disp: 90 tablet, Rfl: 0    Multiple Vitamins-Minerals (MULTIVITAMIN WITH MINERALS) tablet, Take 1 tablet by mouth at bedtime. , Disp: , Rfl:    nitroGLYCERIN (NITROSTAT) 0.4 MG SL tablet, Place 1 tablet (0.4 mg total) under the tongue every 5 (five) minutes as needed for chest pain., Disp: 25 tablet, Rfl: 2   oseltamivir (TAMIFLU) 75 MG capsule, Take 1 capsule (75 mg total) by mouth 2 (two) times daily., Disp: 10 capsule, Rfl: 0   pantoprazole (PROTONIX) 40 MG tablet, TAKE 1 TABLET DAILY 30 TO 60 MINUTES BEFORE FIRST MEAL OF THE DAY, Disp: 90 tablet, Rfl: 1   PAZEO 0.7 % SOLN, Place 1 drop into both eyes daily as needed (allergies). , Disp: , Rfl: 4   rivaroxaban (XARELTO) 20 MG TABS tablet, Take 1 tablet (20 mg total) by mouth daily with supper., Disp: 90 tablet, Rfl: 0   sildenafil (VIAGRA) 100 MG tablet, TAKE 1/2 TO 1 TABLET BY MOUTH DAILY AS NEEDED FOR ERECTILE DYSFUNCTION, Disp: 15 tablet, Rfl: 0   trimethoprim-polymyxin b (POLYTRIM) ophthalmic solution, Place 2 drops into the right eye every 4 (four) hours., Disp: 10 mL, Rfl: 0   Medications ordered in this encounter:  Meds ordered this encounter  Medications   fluticasone (FLONASE) 50 MCG/ACT nasal spray    Sig: Place 1 spray into both nostrils daily.    Dispense:  16 g    Refill:  0   molnupiravir EUA (LAGEVRIO) 200 mg  CAPS capsule    Sig: Take 4 capsules (800 mg total) by mouth 2 (two) times daily for 5 days.    Dispense:  40 capsule    Refill:  0   guaiFENesin 200 MG tablet    Sig: Take 1 tablet (200 mg total) by mouth every 4 (four) hours as needed for up to 7 days for cough or to loosen phlegm.    Dispense:  30 tablet    Refill:  0     *If you need refills on other medications prior to your next appointment, please contact your pharmacy*  Follow-Up: Call back or seek an in-person evaluation if the symptoms worsen or if the condition fails to improve as anticipated.  [facility] [phone]  Other Instructions COVID-19 COVID-19 is  an infection caused by a virus called SARS-CoV-2. This type of virus is called a coronavirus. People with COVID-19 may: Have little to no symptoms. Have mild to moderate symptoms that affect their lungs and breathing. Get very sick. What are the causes? COVID-19 is caused by a virus. This virus may be in the air as droplets or on surfaces. It can spread from an infected person when they cough, sneeze, speak, sing, or breathe. You may become infected if: You breathe in the infected droplets in the air. You touch an object that has the virus on it. What increases the risk? You are at risk of getting COVID-19 if you have been around someone with the infection. You may be more likely to get very sick if: You are 60 years old or older. You have certain medical conditions, such as: Heart disease. Diabetes. Chronic respiratory disease. Cancer. Pregnancy. You are immunocompromised. This means your body cannot fight infections easily. You have a disability or trouble moving, meaning you're immobile. What are the signs or symptoms? People may have different symptoms from COVID-19. The symptoms can also be mild to severe. They often show up in 5-6 days after being infected. But they can take up to 14 days to appear. Common symptoms are: Cough. Feeling tired. New loss of taste or smell. Fever. Less common symptoms are: Sore throat. Headache. Body or muscle aches. Diarrhea. A skin rash or odd-colored fingers or toes. Red or irritated eyes. Sometimes, COVID-19 does not cause symptoms. How is this diagnosed? COVID-19 can be diagnosed with tests done in the lab or at home. Fluid from your nose, mouth, or lungs will be used to check for the virus. How is this treated? Treatment for COVID-19 depends on how sick you are. Mild symptoms can be treated at home with rest, fluids, and over-the-counter medicines. Severe symptoms may be treated in a hospital intensive care unit (ICU). If you have  symptoms and are at risk of getting very sick, you may be given a medicine that fights viruses. This medicine is called an antiviral. How is this prevented? To protect yourself from COVID-19: Know your risk factors. Get vaccinated. If your body cannot fight infections easily, talk to your provider about treatment to help prevent COVID-19. Stay at least 1 meter away from others. Wear a well-fitted mask when: You can't stay at a distance from people. You're in a place with poor air flow. Try to be in open spaces with good air flow when in public. Wash your hands often or use an alcohol-based hand sanitizer. Cover your nose and mouth when coughing and sneezing. If you think you have COVID-19 or have been around someone who has it,  stay home and be by yourself for 5-10 days. Where to find more information Centers for Disease Control and Prevention (CDC): TonerPromos.no World Health Organization [facility]): VisitDestination.com.br Get help right away if: You have trouble breathing or get short of breath. You have pain or pressure in your chest. You cannot speak or move any part of your body. You are confused. Your symptoms get worse. These symptoms may be an emergency. Get help right away. Call 911. Do not wait to see if the symptoms will go away. Do not drive yourself to the hospital. This information is not intended to replace advice given to you by your health care provider. Make sure you discuss any questions you have with your health care provider. Document Revised: 05/03/2022 Document Reviewed: 01/07/2022 Elsevier Patient Education  2024 Elsevier Inc.    If you have been instructed to have an in-person evaluation today at a local Urgent Care facility, please use the link below. It will take you to a list of all of our available [facility], including address, phone number and hours of operation. Please do not delay care.  [facility]  If you or a family member do not have a primary  care provider, use the link below to schedule a visit and establish care. When you choose a [facility] primary care physician or advanced practice provider, you gain a long-term partner in health. Find a Primary Care Provider  Learn more about [facility]'s in-office and virtual care options: [facility] - Get Care Now

## 2023-01-28 NOTE — Progress Notes (Signed)
Virtual Visit Consent   Michael Livingston, you are scheduled for a virtual visit with a [facility] provider today. Just as with appointments in the office, your consent must be obtained to participate. Your consent will be active for this visit and any virtual visit you may have with one of our providers in the next 365 days. If you have a MyChart account, a copy of this consent can be sent to you electronically.  As this is a virtual visit, video technology does not allow for your provider to perform a traditional examination. This may limit your provider's ability to fully assess your condition. If your provider identifies any concerns that need to be evaluated in person or the need to arrange testing (such as labs, EKG, etc.), we will make arrangements to do so. Although advances in technology are sophisticated, we cannot ensure that it will always work on either your end or our end. If the connection with a video visit is poor, the visit may have to be switched to a telephone visit. With either a video or telephone visit, we are not always able to ensure that we have a secure connection.  By engaging in this virtual visit, you consent to the provision of healthcare and authorize for your insurance to be billed (if applicable) for the services provided during this visit. Depending on your insurance coverage, you may receive a charge related to this service.  I need to obtain your verbal consent now. Are you willing to proceed with your visit today? Michael Livingston has provided verbal consent on 01/28/2023 for a virtual visit (video or telephone). Reed Pandy, [place]  Date: 01/28/2023 10:33 AM  Virtual Visit via Video Note   I, Reed Pandy, connected with  Michael Livingston  ([id number], 04-Dec-1960) on 01/28/23 at 10:30 AM EDT by a video-enabled telemedicine application and verified that I am speaking with the correct person using two identifiers.  Location: Patient: Virtual Visit Location  Patient: Home Provider: Virtual Visit Location Provider: Home Office   I discussed the limitations of evaluation and management by telemedicine and the availability of in person appointments. The patient expressed understanding and agreed to proceed.    History of Present Illness: Michael Livingston is a 62 y.o. who identifies as a male who was assigned male at birth, and is being seen today for c/o he is experiencing symptoms of cough, sore throat and light headache.  Pt states the last couple days has fatigue, congestion and body aches.  Pt states tested positive for COVID this morning.  Pt has shown me his positive COVID test.  Pt states has taken tylenol and claritin.   HPI: HPI  Problems:  Patient Active Problem List   Diagnosis Date Noted   GERD (gastroesophageal reflux disease) 12/02/2020   Persistent atrial fibrillation (HCC) 06/11/2019   Secondary hypercoagulable state (HCC) 06/11/2019   Facial cellulitis 03/15/2019   Significant closed head trauma within past 3 months 03/15/2019   Pulmonary sarcoidosis (HCC) 12/06/2017   DOE (dyspnea on exertion) 12/05/2017   Coronary artery disease involving native coronary artery of native heart with angina pectoris (HCC)    Paroxysmal atrial fibrillation (HCC) 01/30/2017   Preoperative clearance 01/24/2015   Dysmetabolic syndrome X 04/02/2013   Hyperlipidemia with target LDL less than 70 04/02/2013   Type 2 diabetes mellitus without complication, without long-term current use of insulin (HCC) 07/16/2012   Hyperlipidemia 07/16/2012   Coronary atherosclerosis 02/06/2009   Generalized abdominal pain 02/06/2009  Allergies:  Allergies  Allergen Reactions   Aleve [Naproxen Sodium] Swelling and Other (See Comments)    Mouth swells   Oxycodone Other (See Comments)    Rendered the patient unable to fall asleep AND he "almost passed out"   Sulfa Antibiotics Hives   Trazodone And Nefazodone Other (See Comments)    Seeing colored lights    Vicodin [Hydrocodone-Acetaminophen] Other (See Comments)    Rendered the patient unable to fall asleep AND he "almost passed out"   Medications:  Current Outpatient Medications:    fluticasone (FLONASE) 50 MCG/ACT nasal spray, Place 1 spray into both nostrils daily., Disp: 16 g, Rfl: 0   guaiFENesin 200 MG tablet, Take 1 tablet (200 mg total) by mouth every 4 (four) hours as needed for up to 7 days for cough or to loosen phlegm., Disp: 30 tablet, Rfl: 0   molnupiravir EUA (LAGEVRIO) 200 mg CAPS capsule, Take 4 capsules (800 mg total) by mouth 2 (two) times daily for 5 days., Disp: 40 capsule, Rfl: 0   Clobetasol Propionate 0.05 % lotion, Apply sparingly to skin of affected area twice daily up to 2 weeks consecutively., Disp: 59 mL, Rfl: 0   ezetimibe-simvastatin (VYTORIN) 10-40 MG tablet, TAKE 1 TABLET BY MOUTH EVERY DAY, Disp: 90 tablet, Rfl: 2   famotidine (PEPCID) 20 MG tablet, TAKE 1 TABLET BY MOUTH EVERY DAY, Disp: 90 tablet, Rfl: 0   icosapent Ethyl (VASCEPA) 1 g capsule, Take 1 capsule (1 g total) by mouth 2 (two) times daily., Disp: 180 capsule, Rfl: 3   lisinopril (ZESTRIL) 2.5 MG tablet, TAKE 1 TABLET BY MOUTH EVERY DAY, Disp: 90 tablet, Rfl: 1   metFORMIN (GLUCOPHAGE) 500 MG tablet, TAKE 1 TABLET BY MOUTH TWICE A DAY WITH FOOD, Disp: 180 tablet, Rfl: 1   metoprolol succinate (TOPROL-XL) 25 MG 24 hr tablet, TAKE 1 TABLET (25 MG TOTAL) BY MOUTH DAILY., Disp: 90 tablet, Rfl: 0   Multiple Vitamins-Minerals (MULTIVITAMIN WITH MINERALS) tablet, Take 1 tablet by mouth at bedtime. , Disp: , Rfl:    nitroGLYCERIN (NITROSTAT) 0.4 MG SL tablet, Place 1 tablet (0.4 mg total) under the tongue every 5 (five) minutes as needed for chest pain., Disp: 25 tablet, Rfl: 2   oseltamivir (TAMIFLU) 75 MG capsule, Take 1 capsule (75 mg total) by mouth 2 (two) times daily., Disp: 10 capsule, Rfl: 0   pantoprazole (PROTONIX) 40 MG tablet, TAKE 1 TABLET DAILY 30 TO 60 MINUTES BEFORE FIRST MEAL OF THE DAY, Disp: 90  tablet, Rfl: 1   PAZEO 0.7 % SOLN, Place 1 drop into both eyes daily as needed (allergies). , Disp: , Rfl: 4   rivaroxaban (XARELTO) 20 MG TABS tablet, Take 1 tablet (20 mg total) by mouth daily with supper., Disp: 90 tablet, Rfl: 0   sildenafil (VIAGRA) 100 MG tablet, TAKE 1/2 TO 1 TABLET BY MOUTH DAILY AS NEEDED FOR ERECTILE DYSFUNCTION, Disp: 15 tablet, Rfl: 0   trimethoprim-polymyxin b (POLYTRIM) ophthalmic solution, Place 2 drops into the right eye every 4 (four) hours., Disp: 10 mL, Rfl: 0  Observations/Objective: Patient is well-developed, well-nourished in no acute distress.  Resting comfortably at home.  Head is normocephalic, atraumatic.  No labored breathing.  Speech is clear and coherent with logical content.  Patient is alert and oriented at baseline.    Assessment and Plan: 1. COVID-19 - fluticasone (FLONASE) 50 MCG/ACT nasal spray; Place 1 spray into both nostrils daily.  Dispense: 16 g; Refill: 0 - molnupiravir EUA (LAGEVRIO) 200  mg CAPS capsule; Take 4 capsules (800 mg total) by mouth 2 (two) times daily for 5 days.  Dispense: 40 capsule; Refill: 0 - guaiFENesin 200 MG tablet; Take 1 tablet (200 mg total) by mouth every 4 (four) hours as needed for up to 7 days for cough or to loosen phlegm.  Dispense: 30 tablet; Refill: 0  -Pt to F/U with PCP or urgent care if symptoms persist or worsen.   Follow Up Instructions: I discussed the assessment and treatment plan with the patient. The patient was provided an opportunity to ask questions and all were answered. The patient agreed with the plan and demonstrated an understanding of the instructions.  A copy of instructions were sent to the patient via MyChart unless otherwise noted below.     The patient was advised to call back or seek an in-person evaluation if the symptoms worsen or if the condition fails to improve as anticipated.  Time:  I spent 15 minutes with the patient via telehealth technology discussing the above  problems/concerns.    Reed Pandy, PA-C

## 2023-02-12 ENCOUNTER — Other Ambulatory Visit: Payer: Self-pay | Admitting: Family Medicine

## 2023-02-19 ENCOUNTER — Other Ambulatory Visit: Payer: Self-pay | Admitting: Family Medicine

## 2023-02-19 DIAGNOSIS — R0609 Other forms of dyspnea: Secondary | ICD-10-CM

## 2023-02-26 ENCOUNTER — Other Ambulatory Visit: Payer: Self-pay | Admitting: Family Medicine

## 2023-03-07 ENCOUNTER — Encounter: Payer: Self-pay | Admitting: Family Medicine

## 2023-03-07 ENCOUNTER — Telehealth (INDEPENDENT_AMBULATORY_CARE_PROVIDER_SITE_OTHER): Payer: BC Managed Care – PPO | Admitting: Family Medicine

## 2023-03-07 DIAGNOSIS — E785 Hyperlipidemia, unspecified: Secondary | ICD-10-CM

## 2023-03-07 DIAGNOSIS — I480 Paroxysmal atrial fibrillation: Secondary | ICD-10-CM

## 2023-03-07 DIAGNOSIS — J209 Acute bronchitis, unspecified: Secondary | ICD-10-CM

## 2023-03-07 DIAGNOSIS — E119 Type 2 diabetes mellitus without complications: Secondary | ICD-10-CM

## 2023-03-07 DIAGNOSIS — Z79899 Other long term (current) drug therapy: Secondary | ICD-10-CM

## 2023-03-07 DIAGNOSIS — N529 Male erectile dysfunction, unspecified: Secondary | ICD-10-CM

## 2023-03-07 MED ORDER — DOXYCYCLINE HYCLATE 100 MG PO CAPS: 100.0000 mg | ORAL_CAPSULE | Freq: Two times a day (BID) | ORAL | 0 refills | Status: DC

## 2023-03-07 MED ORDER — SILDENAFIL CITRATE 100 MG PO TABS: 100.0000 mg | ORAL_TABLET | Freq: Every day | ORAL | 5 refills | Status: AC | PRN

## 2023-03-07 NOTE — Progress Notes (Signed)
Patient ID: Michael Livingston, male   DOB: 1960/10/29, 62 y.o.   MRN: [id number]   Virtual Visit via Video Note  I connected with Michael Livingston on 03/07/23 at  2:45 PM EDT by a video enabled telemedicine application and verified that I am speaking with the correct person using two identifiers.  Location patient: home Location provider:work or home office Persons participating in the virtual visit: patient, provider  I discussed the limitations of evaluation and management by telemedicine and the availability of in person appointments. The patient expressed understanding and agreed to proceed.   HPI: Michael Livingston has history of CAD, atrial fibrillation, pulmonary sarcoidosis, GERD, type 2 diabetes, dyslipidemia.  Called to set this up for several issues as follows  Cough for about the past couple weeks.  Productive of dark green sputum.  Also has some nasal congestion with thick colored mucus.  Occasional headaches.  No fever.  Has been taking some Hall's cough drops but still coughing some at night.  No obvious wheezing.  No dyspnea at rest.  History of erectile dysfunction.  He has used sildenafil in the past.  Requesting refills.  He knows that this cannot be mixed with nitroglycerin.  He has history of type 2 diabetes.  A1c has been well-controlled.  Not checked in a year.  Not monitoring blood sugars regularly.  Takes metformin for hyperglycemia but no other diabetic drugs.  Followed closely by cardiology.  He has hyperlipidemia and takes Vytorin along with Vascepa.  Also remains on Xarelto.  No recent chest pains.   ROS: See pertinent positives and negatives per HPI.  Past Medical History:  Diagnosis Date   Allergy    Atrial fibrillation (HCC)    a. diagnosed in 01/2017 --> s/p DCCV. Started on Xarelto for anticoagulation.    CAD (coronary artery disease)    a. s/p DES to LAD in 2005 b. patent by cath in 2007 with 30% LCx stenosis noted at that time c. normal NST in 05/2011   Chronic  kidney disease    1 kidney stone   Diabetes mellitus without complication (HCC)    type 2, no meds needed   Hyperlipidemia    Hypertension    Myocardial infarction [facility])     Past Surgical History:  Procedure Laterality Date   ATRIAL FIBRILLATION ABLATION N/A 05/07/2019   Procedure: ATRIAL FIBRILLATION ABLATION;  Surgeon: Hillis Range, MD;  Location: [facility];  Service: Cardiovascular;  Laterality: N/A;   CARDIAC SURGERY  2005   stent   CORONARY STENT PLACEMENT     KNEE ARTHROSCOPY     left 2003, right 2005   LEFT HEART CATH AND CORONARY ANGIOGRAPHY N/A 12/15/2017   Procedure: LEFT HEART CATH AND CORONARY ANGIOGRAPHY;  Surgeon: Lennette Bihari, MD;  Location: [facility];  Service: Cardiovascular;  Laterality: N/A;   SHOULDER SURGERY      Family History  Problem Relation Age of Onset   Diabetes Mother    Heart disease Father    Diabetes Father    Heart disease Paternal Grandfather    Colon cancer Neg Hx    Esophageal cancer Neg Hx    Stomach cancer Neg Hx    Rectal cancer Neg Hx     SOCIAL HX: Non-smoker   Current Outpatient Medications:    Clobetasol Propionate 0.05 % lotion, Apply sparingly to skin of affected area twice daily up to 2 weeks consecutively., Disp: 59 mL, Rfl: 0   doxycycline (VIBRAMYCIN) 100 MG  capsule, Take 1 capsule (100 mg total) by mouth 2 (two) times daily., Disp: 14 capsule, Rfl: 0   ezetimibe-simvastatin (VYTORIN) 10-40 MG tablet, TAKE 1 TABLET BY MOUTH EVERY DAY, Disp: 90 tablet, Rfl: 2   famotidine (PEPCID) 20 MG tablet, TAKE 1 TABLET BY MOUTH EVERY DAY, Disp: 90 tablet, Rfl: 0   icosapent Ethyl (VASCEPA) 1 g capsule, Take 1 capsule (1 g total) by mouth 2 (two) times daily., Disp: 180 capsule, Rfl: 3   lisinopril (ZESTRIL) 2.5 MG tablet, TAKE 1 TABLET BY MOUTH EVERY DAY, Disp: 90 tablet, Rfl: 0   metFORMIN (GLUCOPHAGE) 500 MG tablet, TAKE 1 TABLET BY MOUTH TWICE A DAY WITH FOOD, Disp: 180 tablet, Rfl: 1   metoprolol succinate  (TOPROL-XL) 25 MG 24 hr tablet, TAKE 1 TABLET (25 MG TOTAL) BY MOUTH DAILY., Disp: 90 tablet, Rfl: 0   Multiple Vitamins-Minerals (MULTIVITAMIN WITH MINERALS) tablet, Take 1 tablet by mouth at bedtime. , Disp: , Rfl:    nitroGLYCERIN (NITROSTAT) 0.4 MG SL tablet, Place 1 tablet (0.4 mg total) under the tongue every 5 (five) minutes as needed for chest pain., Disp: 25 tablet, Rfl: 2   oseltamivir (TAMIFLU) 75 MG capsule, Take 1 capsule (75 mg total) by mouth 2 (two) times daily., Disp: 10 capsule, Rfl: 0   pantoprazole (PROTONIX) 40 MG tablet, TAKE 1 TABLET DAILY 30 TO 60 MINUTES BEFORE FIRST MEAL OF THE DAY, Disp: 90 tablet, Rfl: 1   PAZEO 0.7 % SOLN, Place 1 drop into both eyes daily as needed (allergies). , Disp: , Rfl: 4   rivaroxaban (XARELTO) 20 MG TABS tablet, Take 1 tablet (20 mg total) by mouth daily with supper., Disp: 90 tablet, Rfl: 0   trimethoprim-polymyxin b (POLYTRIM) ophthalmic solution, Place 2 drops into the right eye every 4 (four) hours., Disp: 10 mL, Rfl: 0   fluticasone (FLONASE) 50 MCG/ACT nasal spray, Place 1 spray into both nostrils daily., Disp: 16 g, Rfl: 0   sildenafil (VIAGRA) 100 MG tablet, Take 1 tablet (100 mg total) by mouth daily as needed for erectile dysfunction., Disp: 15 tablet, Rfl: 5  EXAM:  VITALS per patient if applicable:  GENERAL: alert, oriented, appears well and in no acute distress    ASSESSMENT AND PLAN:  Discussed the following assessment and plan:  #1 cough.  Suspect acute bronchitis.  May have concomitant sinusitis.  We elected to go ahead and cover with doxycycline 100 mg twice daily for 7 days.  Continue good hydration.  Follow-up immediately for any fever or increased shortness of breath  #2 erectile dysfunction.  Patient has taken sildenafil in the past.  He knows not to mix with nitroglycerin.  Refill sildenafil 100 mg 1/2 to 1 tablet daily as needed  #3 type 2 diabetes with history of good control.  Treated with metformin.  Future  lab for A1c.    #4 dyslipidemia in a patient with CAD.  Future lab order for lipid and CMP     I discussed the assessment and treatment plan with the patient. The patient was provided an opportunity to ask questions and all were answered. The patient agreed with the plan and demonstrated an understanding of the instructions.   The patient was advised to call back or seek an in-person evaluation if the symptoms worsen or if the condition fails to improve as anticipated.     Evelena Peat, MD

## 2023-03-09 ENCOUNTER — Other Ambulatory Visit: Payer: Self-pay | Admitting: Family Medicine

## 2023-03-10 ENCOUNTER — Other Ambulatory Visit (INDEPENDENT_AMBULATORY_CARE_PROVIDER_SITE_OTHER): Payer: BC Managed Care – PPO

## 2023-03-10 DIAGNOSIS — E119 Type 2 diabetes mellitus without complications: Secondary | ICD-10-CM | POA: Diagnosis not present

## 2023-03-10 DIAGNOSIS — Z79899 Other long term (current) drug therapy: Secondary | ICD-10-CM | POA: Diagnosis not present

## 2023-03-10 DIAGNOSIS — E785 Hyperlipidemia, unspecified: Secondary | ICD-10-CM | POA: Diagnosis not present

## 2023-03-10 LAB — COMPREHENSIVE METABOLIC PANEL
ALT: 17 U/L (ref 0–53)
AST: 17 U/L (ref 0–37)
Albumin: 4.4 g/dL (ref 3.5–5.2)
Alkaline Phosphatase: 58 U/L (ref 39–117)
BUN: 17 mg/dL (ref 6–23)
CO2: 28 meq/L (ref 19–32)
Calcium: 9.8 mg/dL (ref 8.4–10.5)
Chloride: 101 meq/L (ref 96–112)
Creatinine, Ser: 0.97 mg/dL (ref 0.40–1.50)
GFR: 85.01 mL/min (ref >60.00)
Glucose, Bld: 138 mg/dL — ABNORMAL HIGH (ref 70–99)
Potassium: 4.2 meq/L (ref 3.5–5.1)
Sodium: 136 meq/L (ref 135–145)
Total Bilirubin: 0.9 mg/dL (ref 0.2–1.2)
Total Protein: 7.9 g/dL (ref 6.0–8.3)

## 2023-03-10 LAB — CBC WITH DIFFERENTIAL/PLATELET
Basophils Absolute: 0.1 10*3/uL (ref 0.0–0.1)
Basophils Relative: 0.9 % (ref 0.0–3.0)
Eosinophils Absolute: 0.2 10*3/uL (ref 0.0–0.7)
Eosinophils Relative: 3.2 % (ref 0.0–5.0)
HCT: 41.2 % (ref 39.0–52.0)
Hemoglobin: 14 g/dL (ref 13.0–17.0)
Lymphocytes Relative: 30.6 % (ref 12.0–46.0)
Lymphs Abs: 1.6 10*3/uL (ref 0.7–4.0)
MCHC: 34 g/dL (ref 30.0–36.0)
MCV: 92.2 fL (ref 78.0–100.0)
Monocytes Absolute: 0.5 10*3/uL (ref 0.1–1.0)
Monocytes Relative: 9 % (ref 3.0–12.0)
Neutro Abs: 3 10*3/uL (ref 1.4–7.7)
Neutrophils Relative %: 56.3 % (ref 43.0–77.0)
Platelets: 258 10*3/uL (ref 150.0–400.0)
RBC: 4.47 Mil/uL (ref 4.22–5.81)
RDW: 12.6 % (ref 11.5–15.5)
WBC: 5.4 10*3/uL (ref 4.0–10.5)

## 2023-03-10 LAB — LIPID PANEL
Cholesterol: 121 mg/dL (ref 0–200)
HDL: 38.4 mg/dL — ABNORMAL LOW (ref >39.00)
LDL Cholesterol: 58 mg/dL (ref 0–99)
NonHDL: 82.9
Total CHOL/HDL Ratio: 3
Triglycerides: 126 mg/dL (ref 0.0–149.0)
VLDL: 25.2 mg/dL (ref 0.0–40.0)

## 2023-03-10 LAB — MICROALBUMIN / CREATININE URINE RATIO
Creatinine,U: 102.6 mg/dL
Microalb Creat Ratio: 0.8 mg/g (ref 0.0–30.0)
Microalb, Ur: 0.8 mg/dL (ref 0.0–1.9)

## 2023-03-10 LAB — HEMOGLOBIN A1C: Hgb A1c MFr Bld: 7.3 % — ABNORMAL HIGH (ref 4.6–6.5)

## 2023-03-14 MED ORDER — METFORMIN HCL 500 MG PO TABS: ORAL_TABLET | ORAL | 0 refills | Status: DC

## 2023-03-21 ENCOUNTER — Encounter: Payer: Self-pay | Admitting: Family Medicine

## 2023-03-21 ENCOUNTER — Telehealth (INDEPENDENT_AMBULATORY_CARE_PROVIDER_SITE_OTHER): Payer: BC Managed Care – PPO | Admitting: Family Medicine

## 2023-03-21 DIAGNOSIS — S39012A Strain of muscle, fascia and tendon of lower back, initial encounter: Secondary | ICD-10-CM | POA: Diagnosis not present

## 2023-03-21 MED ORDER — METHOCARBAMOL 750 MG PO TABS: 750.0000 mg | ORAL_TABLET | Freq: Three times a day (TID) | ORAL | 1 refills | Status: DC | PRN

## 2023-03-21 NOTE — Progress Notes (Signed)
Patient ID: Michael Livingston, male   DOB: 1960/11/06, 62 y.o.   MRN: [id number]   Virtual Visit via Video Note  I connected with Michael Livingston on 03/21/23 at 11:00 AM EST by a video enabled telemedicine application and verified that I am speaking with the correct person using two identifiers.  Location patient: home Location provider:work or home office Persons participating in the virtual visit: patient, provider  I discussed the limitations of evaluation and management by telemedicine and the availability of in person appointments. The patient expressed understanding and agreed to proceed.   HPI:  Michael Livingston called to set up this visit with onset last Friday some low back pain.  Denies specific injury but he has a [employer] and does a lot of physical exertion daily.  Thinks he may have injured his low back with being in awkward position recently.  Pain is mid lower lumbar area.  No radiculitis symptoms.  No numbness or weakness.  No urine or stool incontinence.  Has been taking Tylenol with slight improvement.  Still has tremendous pain at night with things like rolling over in bed.  Walking fairly well and tolerating ambulation.  Has had similar flareups in the past.  No back surgery  Recent labs with A1c 7.3%.  We have discussed possible addition of Jardiance to his metformin but he preferred to increase metformin dose first.  We plan to get follow-up A1c in 3 months  ROS: See pertinent positives and negatives per HPI.  Past Medical History:  Diagnosis Date   Allergy    Atrial fibrillation (HCC)    a. diagnosed in 01/2017 --> s/p DCCV. Started on Xarelto for anticoagulation.    CAD (coronary artery disease)    a. s/p DES to LAD in 2005 b. patent by cath in 2007 with 30% LCx stenosis noted at that time c. normal NST in 05/2011   Chronic kidney disease    1 kidney stone   Diabetes mellitus without complication (HCC)    type 2, no meds needed   Hyperlipidemia    Hypertension     Myocardial infarction [facility])     Past Surgical History:  Procedure Laterality Date   ATRIAL FIBRILLATION ABLATION N/A 05/07/2019   Procedure: ATRIAL FIBRILLATION ABLATION;  Surgeon: Hillis Range, MD;  Location: [facility];  Service: Cardiovascular;  Laterality: N/A;   CARDIAC SURGERY  2005   stent   CORONARY STENT PLACEMENT     KNEE ARTHROSCOPY     left 2003, right 2005   LEFT HEART CATH AND CORONARY ANGIOGRAPHY N/A 12/15/2017   Procedure: LEFT HEART CATH AND CORONARY ANGIOGRAPHY;  Surgeon: Lennette Bihari, MD;  Location: [facility];  Service: Cardiovascular;  Laterality: N/A;   SHOULDER SURGERY      Family History  Problem Relation Age of Onset   Diabetes Mother    Heart disease Father    Diabetes Father    Heart disease Paternal Grandfather    Colon cancer Neg Hx    Esophageal cancer Neg Hx    Stomach cancer Neg Hx    Rectal cancer Neg Hx     SOCIAL HX: Non-smoker   Current Outpatient Medications:    Clobetasol Propionate 0.05 % lotion, Apply sparingly to skin of affected area twice daily up to 2 weeks consecutively., Disp: 59 mL, Rfl: 0   doxycycline (VIBRAMYCIN) 100 MG capsule, Take 1 capsule (100 mg total) by mouth 2 (two) times daily., Disp: 14 capsule, Rfl: 0  ezetimibe-simvastatin (VYTORIN) 10-40 MG tablet, TAKE 1 TABLET BY MOUTH EVERY DAY, Disp: 90 tablet, Rfl: 2   famotidine (PEPCID) 20 MG tablet, TAKE 1 TABLET BY MOUTH EVERY DAY, Disp: 90 tablet, Rfl: 0   icosapent Ethyl (VASCEPA) 1 g capsule, Take 1 capsule (1 g total) by mouth 2 (two) times daily., Disp: 180 capsule, Rfl: 3   lisinopril (ZESTRIL) 2.5 MG tablet, TAKE 1 TABLET BY MOUTH EVERY DAY, Disp: 90 tablet, Rfl: 0   metFORMIN (GLUCOPHAGE) 500 MG tablet, Take 2 tablets twice daily, Disp: 360 tablet, Rfl: 0   methocarbamol (ROBAXIN-750) 750 MG tablet, Take 1 tablet (750 mg total) by mouth every 8 (eight) hours as needed for muscle spasms., Disp: 30 tablet, Rfl: 1   metoprolol succinate  (TOPROL-XL) 25 MG 24 hr tablet, TAKE 1 TABLET (25 MG TOTAL) BY MOUTH DAILY., Disp: 90 tablet, Rfl: 0   Multiple Vitamins-Minerals (MULTIVITAMIN WITH MINERALS) tablet, Take 1 tablet by mouth at bedtime. , Disp: , Rfl:    nitroGLYCERIN (NITROSTAT) 0.4 MG SL tablet, Place 1 tablet (0.4 mg total) under the tongue every 5 (five) minutes as needed for chest pain., Disp: 25 tablet, Rfl: 2   oseltamivir (TAMIFLU) 75 MG capsule, Take 1 capsule (75 mg total) by mouth 2 (two) times daily., Disp: 10 capsule, Rfl: 0   pantoprazole (PROTONIX) 40 MG tablet, TAKE 1 TABLET DAILY 30 TO 60 MINUTES BEFORE FIRST MEAL OF THE DAY, Disp: 90 tablet, Rfl: 1   PAZEO 0.7 % SOLN, Place 1 drop into both eyes daily as needed (allergies). , Disp: , Rfl: 4   rivaroxaban (XARELTO) 20 MG TABS tablet, Take 1 tablet (20 mg total) by mouth daily with supper., Disp: 90 tablet, Rfl: 0   sildenafil (VIAGRA) 100 MG tablet, Take 1 tablet (100 mg total) by mouth daily as needed for erectile dysfunction., Disp: 15 tablet, Rfl: 5   trimethoprim-polymyxin b (POLYTRIM) ophthalmic solution, Place 2 drops into the right eye every 4 (four) hours., Disp: 10 mL, Rfl: 0   fluticasone (FLONASE) 50 MCG/ACT nasal spray, Place 1 spray into both nostrils daily., Disp: 16 g, Rfl: 0  EXAM:  VITALS per patient if applicable:  GENERAL: alert, oriented, appears well and in no acute distress  HEENT: atraumatic, conjunttiva clear, no obvious abnormalities on inspection of external nose and ears  NECK: normal movements of the head and neck  LUNGS: on inspection no signs of respiratory distress, breathing rate appears normal, no obvious gross SOB, gasping or wheezing  CV: no obvious cyanosis  MS: moves all visible extremities without noticeable abnormality  PSYCH/NEURO: pleasant and cooperative, no obvious depression or anxiety, speech and thought processing grossly intact  ASSESSMENT AND PLAN:  Discussed the following assessment and plan:  #1 low  back pain.  Suspect lumbar strain.  He does not describe any radiculitis symptoms or focal neurologic deficits.  -Discussed conservative management: -Regular walking and aerobic activity as tolerated- -Robaxin 750 mg 1 every 8 hours as needed for muscle spasm which he will use predominantly at night -Continue Tylenol as needed -Touch base if not improving next couple weeks  #2 type 2 diabetes.  Recent suboptimal control with A1c 7.3%.  Patient increased his metformin to twice daily and recommend repeat A1c in 3 months.  If not further to goal at that point consider Jardiance      I discussed the assessment and treatment plan with the patient. The patient was provided an opportunity to ask questions and all were answered.  The patient agreed with the plan and demonstrated an understanding of the instructions.   The patient was advised to call back or seek an in-person evaluation if the symptoms worsen or if the condition fails to improve as anticipated.     Evelena Peat, MD

## 2023-03-28 ENCOUNTER — Other Ambulatory Visit: Payer: Self-pay | Admitting: Family Medicine

## 2023-04-18 ENCOUNTER — Encounter (INDEPENDENT_AMBULATORY_CARE_PROVIDER_SITE_OTHER): Payer: Self-pay

## 2023-04-18 ENCOUNTER — Ambulatory Visit (INDEPENDENT_AMBULATORY_CARE_PROVIDER_SITE_OTHER): Payer: BC Managed Care – PPO

## 2023-04-18 VITALS — Ht 73.0 in | Wt 210.0 lb

## 2023-04-18 DIAGNOSIS — R0982 Postnasal drip: Secondary | ICD-10-CM | POA: Diagnosis not present

## 2023-04-18 DIAGNOSIS — H9202 Otalgia, left ear: Secondary | ICD-10-CM | POA: Diagnosis not present

## 2023-04-18 DIAGNOSIS — R0981 Nasal congestion: Secondary | ICD-10-CM

## 2023-04-18 DIAGNOSIS — H6122 Impacted cerumen, left ear: Secondary | ICD-10-CM | POA: Diagnosis not present

## 2023-04-18 DIAGNOSIS — H6123 Impacted cerumen, bilateral: Secondary | ICD-10-CM | POA: Insufficient documentation

## 2023-04-18 DIAGNOSIS — J310 Chronic rhinitis: Secondary | ICD-10-CM

## 2023-04-18 NOTE — Progress Notes (Signed)
Patient ID: Michael Livingston, male   DOB: 1960/11/05, 62 y.o.   MRN: [id number]  CC: Left ear pain, postnasal drainage  HPI: The patient is a 62 year old male who presents today complaining of acute onset left ear pain for the past 2 days.  He also complains of chronic postnasal drainage and nasal congestion.  The patient was last seen 1 year ago.  At that time, he was noted to have bilateral high-frequency sensorineural hearing loss, likely secondary to presbycusis.  The patient returns today complaining of an acute onset left ear pain that started yesterday.  The pain radiates to the left side of his head.  He denies any otorrhea, vertigo, or change in his hearing.  In addition, he also complains of frequent postnasal drainage and nasal congestion.  He denies any fever or visual change.  Exam: General: Communicates without difficulty, well nourished, no acute distress. Head: Normocephalic, no evidence injury, no tenderness, facial buttresses intact without stepoff. Face/sinus: No tenderness to palpation and percussion. Facial movement is normal and symmetric. Eyes: PERRL, EOMI. No scleral icterus, conjunctivae clear. Neuro: CN II exam reveals vision grossly intact.  No nystagmus at any point of gaze. Ears: Auricles well formed without lesions. EAC: Left ear cerumen impaction.  Under the operating microscope, the cerumen is carefully removed with a combination of cerumen currette, alligator forceps, and suction catheters.  After the cerumen is removed, the TMs are noted to be normal.  Nose: External evaluation reveals normal support and skin without lesions.  Dorsum is intact.  Anterior rhinoscopy reveals congested mucosa over anterior aspect of inferior turbinates and intact septum.  No purulence noted. Oral:  Oral cavity and oropharynx are intact, symmetric, without erythema or edema.  Mucosa is moist without lesions. Neck: Full range of motion without pain.  There is no significant lymphadenopathy.  No  masses palpable.  Thyroid bed within normal limits to palpation.  Parotid glands and submandibular glands equal bilaterally without mass.  Trachea is midline. Neuro:  CN 2-12 grossly intact.   Assessment: 1.  Left ear dense cerumen impaction, causing left ear pain.  After the cerumen removal procedure, his ear canals and tympanic membranes are noted to be normal.  No infection is noted today. 2.  Chronic rhinitis with nasal mucosal congestion and chronic postnasal drainage.  Plan: 1.  Otomicroscopy with left ear cerumen disimpaction. 2.  The physical exam findings are reviewed with the patient. 3.  Flonase nasal spray 2 sprays each nostril daily. 4.  The patient will return for reevaluation in 6 months.

## 2023-04-20 ENCOUNTER — Other Ambulatory Visit: Payer: Self-pay | Admitting: Family Medicine

## 2023-05-18 ENCOUNTER — Other Ambulatory Visit: Payer: Self-pay | Admitting: Family Medicine

## 2023-05-19 ENCOUNTER — Other Ambulatory Visit: Payer: Self-pay | Admitting: Family Medicine

## 2023-06-03 ENCOUNTER — Other Ambulatory Visit: Payer: Self-pay | Admitting: Family Medicine

## 2023-06-19 ENCOUNTER — Other Ambulatory Visit: Payer: Self-pay | Admitting: Family Medicine

## 2023-06-25 ENCOUNTER — Other Ambulatory Visit: Payer: Self-pay | Admitting: Family Medicine

## 2023-06-25 DIAGNOSIS — R0609 Other forms of dyspnea: Secondary | ICD-10-CM

## 2023-06-28 ENCOUNTER — Encounter: Payer: Self-pay | Admitting: Cardiovascular Disease

## 2023-06-28 ENCOUNTER — Ambulatory Visit: Payer: BC Managed Care – PPO | Attending: Cardiovascular Disease | Admitting: Cardiovascular Disease

## 2023-06-28 DIAGNOSIS — I480 Paroxysmal atrial fibrillation: Secondary | ICD-10-CM

## 2023-06-28 DIAGNOSIS — Z9861 Coronary angioplasty status: Secondary | ICD-10-CM | POA: Diagnosis not present

## 2023-06-28 DIAGNOSIS — Z9889 Other specified postprocedural states: Secondary | ICD-10-CM | POA: Diagnosis not present

## 2023-06-28 DIAGNOSIS — I25119 Atherosclerotic heart disease of native coronary artery with unspecified angina pectoris: Secondary | ICD-10-CM | POA: Diagnosis not present

## 2023-06-28 DIAGNOSIS — E119 Type 2 diabetes mellitus without complications: Secondary | ICD-10-CM

## 2023-06-28 DIAGNOSIS — I10 Essential (primary) hypertension: Secondary | ICD-10-CM

## 2023-06-28 DIAGNOSIS — E785 Hyperlipidemia, unspecified: Secondary | ICD-10-CM

## 2023-06-28 DIAGNOSIS — Z8679 Personal history of other diseases of the circulatory system: Secondary | ICD-10-CM

## 2023-06-28 NOTE — Progress Notes (Signed)
Patient ID: Michael Livingston, male   DOB: 11/16/1960, 63 y.o.   MRN: [id number]        HPI: Michael Livingston is a 63 y.o. male presents to the office for a 28-month follow-up cardiology evaluation.  Michael Livingston has CAD and in October 2005 underwent stenting to his proximal to mid left anterior descending artery. His last catheterization was in October 2007 and his LAD was  was widely patent. He did have mild 30% circumflex stenosis. A nuclear perfusion study in January 2013 continued to show normal perfusion.  Michael Livingston has a history of palpitations which have been controlled with beta blocker therapy. He has a history of hyperlipidemia with an atherogenic lipid panel with low HDL levels. An NMR lipoprofile in 2014 demonstrated increased insulin resistance and an increased wall LDL particles.  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 Since that time, he has lost over 20 pounds. He denies recent chest pain. He denies significant increase in palpitations except a rare episode.   He is no longer is working for [employer], but is now owns a [employer].  He denies any exertional chest pain or dyspnea.  He denies palpitations.  He denies PND, orthopnea.  Last year he underwent orthopedic surgery to his shoulder and tolerated this well.    He was started on metformin for diabetes mellitus.  In June 2017 hemoglobin A1c was 6.4, and most recently in January 2018  improved to 5.7.  I last saw him, he did note mild shortness of breath with arm raising and denied exertional chest pain or palpitations.  He was on aspirin and Plavix for dual antiplatelet therapy.  He was on ramipril 5 mg, metoprolol 25 mg twice a day for hypertension and Vytorin 1044.  Hyperlipidemia.  Heme notes mild shortness of breath when he raises his arms.  He denies exertional chest pain symptoms or exertional dyspnea.  He continues to be on  aspirin and Plavix for dual antiplatelet therapy.  He continues to be on ram a pro-5 mg, metoprolol 25 mg twice a day for hypertension.  He is on Vytorin 10/40 for hyperlipidemia, as well as lovaza.  Since I last saw him, he developed new onset atrial fibrillation on 01/19/2017.  He went to [facility].  CBC be met were normal as was his TSH.  He underwent successful DC cardioversion at 150 J with restoration of sinus rhythm.  His Lopressor dose was increased to 30 mg twice a day, and he was started on Xarelto 20 mg for a cha2ds2score of 3 (CAD, hypertension, diabetes mellitus.  His Plavix was discontinued.  He saw Randall An for office follow-up.  He was doing well without recurrence.  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  The month previously, he had significantly banged his head on a piling intrahepatic and as result was very concerned about work-related trauma, particularly while being on Xarelto. He was unaware of any recurrent episodes of arrhythmia.  We had a lengthy discussion about risk-benefit ratio of anticoagulation, particularly with his job.  He preferred not continue Xarelto due to his job risk.  As result, Xarelto was discontinued and he was resumed back on aspirin 81 mg and Plavix 75 mg. I increased metoprolol recommended he continue to take lisinopril 20 mg daily.  If he were to develop any recurrent episodes of AFeinitiation of anticoagulation would be necessary.  When I saw him in the office in March 2019 he apparently was only taking metoprolol tartrate once a day.  As result, I recommended switching him to metoprolol succinate to allow for improved sustained action.  He had been doing well was unaware of any arrhythmias until October 10, 2017.  It was extremely hot, he was working on a roof, he was dehydrated, and he noticed his heart rate speeding up.  He quickly got off the roof.  He was seen by  Dr. Christell Constant and presented to [facility] ER where he was found to be in AF with ventricular rate in the 150s to 160s.  I saw him in the emergency room and in the ER he underwent successful cardioversion with restoration of sinus rhythm.  At that time he was advised to increase metoprolol succinate to 37.5 mg and instead of taking the medication at bedtime to take it in the morning.  He was also given a prescription for short acting diltiazem 30 mg to take on a as needed basis.  I saw him in the office 1 week later on October 18, 2017 at which time he was doing well and was not experiencing any recurrent arrhythmia.    He was seen as an add-on in the office on December 11, 2017 by Micah Flesher, [place].  At that time he had complaints of shortness of breath with activity as well as vague chest fullness and tightness with minimal work.  He was set up for repeat cardiac catheterization by me on December 15, 2017.  This did not reveal any significant residual CAD and he had overlapping stents in the proximal to mid LAD with smooth intimal hyperplasia of 20%.  He had a normal ramus intermediate, dominant left circumflex nondominant RCA.  Medical therapy was recommended and if he had recurrent PAF consider potential candidacy for ablation.  He was restarted back on Xarelto.  He was seen for hospital follow-up with Micah Flesher, PA on January 02, 2018 and he was doing well.  He has occasional chest pain twinges had largely resolved with Protonix.  I saw him on July 26, 2018 at which time he was feeling well and denied any recurrent episodes of arrhythmia.  He had previously been evaluated on  on June 18, 2018 by Joni Reining, nurse practitioner because of concerns for possible blood pressure elevation with monitoring of his home blood pressure unit.  Lisinopril was initiated.  During my evaluation he was on lisinopril 5 mg, metoprolol succinate 37.5 mg daily and has a prescription for diltiazem 30 mg to take as needed if recurrent  palpitations.  He  not required any additional diltiazem supplementation.  He continued to take Protonix for GERD.  He is on Xarelto for anticoagulation.  He continued to be on Vytorin 10/40 for hyperlipidemia.    Michael Livingston had done fairly well without recurrent arrhythmia until March 07, 2019.  On that day, he had taken his father to our office who had a syncopal spell outside while waiting for the elevator.  As result, he had to take his father to the emergency room and ultimately he required a permanent pacemaker.  However later that night upon coming home from the hospital he was eating dinner late and developed recurrent tachycardia.  He had taken diltiazem 30 mg x 2 without benefit and ultimately presented to the emergency room where again he was found  to be in atrial fibrillation.  Since he was anticoagulated on Xarelto he underwent another successful cardioversion in the emergency room with restoration of sinus rhythm.  He denied any associated anginal symptomatology.  He has continued to take metoprolol succinate 37.5 mg daily, lisinopril 2.5 mg, and is on ezetimibe simvastatin 10/20.  He continues to be on Xarelto 20 mg without bleeding.    At his evaluation with me in November 2020 I referred him to Dr. Johney Frame for consideration of ablation and on May 07, 2019 underwent atrial fibrillation ablation with successful electrical isolation and anatomical encircling of all 4 pulmonary veins.  He was seen by Azalee Course, PA in follow-up in May 2021 which time he continued to do well.  He continued on very low-dose metoprolol succinate 25 mg as well as Xarelto.  He was not having any chest pain.  He was seen by Francis Dowse in EP clinic in July.  He was stable with reference to his prior ablation.  I saw him on March 24, 2020 at which time he felt well.  He was unaware of any recurrent atrial fibrillation or palpitations.  He is not going up on the roof as much as he had in the past.  Remotely  palpitations would occur during periods of increased stress associated with dehydration.  He denied any chest pain PND orthopnea.  He was on metoprolol succinate 25 mg daily and lisinopril 2.5 mg for hypertension and his PAF.  He was on Vytorin 10/20 for hyperlipidemia.  He was on Metformin for type 2 diabetes mellitus with recent hemoglobin A1c at 5.9..  He continues to be on Xarelto 20 mg without bleeding.    I saw him on March 23, 2022.  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 was only taking a half of Vytorin couple days per week with ultimate discontinuance.  Of note, he underwent laboratory on October 30 by Dr. Caryl Never and his total cholesterol was 164, triglycerides had risen to 360 VLDL was 72, a direct LDL was 101.  He is unaware of any recurrent AF following his ablation in December 2020.  He continued to be on Xarelto 20 mg for hyperlipidemia.  He is on metoprolol succinate 25 mg daily in addition to lisinopril 2.5 mg for blood pressure control.  During that evaluation, in light of his elevated triglycerides I recommend resumption of Zetia/simvastatin combination at 1040 and also discussed omega-3 fatty acid therapy with either Vascepa or Lovaza but initially he wanted to try over-the-counter medication.  I discussed majors boundaries 1400 mg of with 980 mg is of EPA and DHA.  I last saw him on Sep 27, 2022 at which time he continued to be stable and was on aware of any abnormal heart rhythms.   He is now on lisinopril 2.5 mg, metoprolol succinate 25 mg daily for hypertension.  He is back on Zetia/simvastatin 10/40 mg for hyperlipidemia and has been taking over-the-counter fish oil.  He is diabetic on metformin 500 mg twice a day.  He is anticoagulated on Xarelto and has been maintaining sinus rhythm.  He is on pantoprazole for GERD.    Since I saw him, he has continued to feel well.  He continues to be busy with  his roofing business.  He is unaware of any recurrent atrial fibrillation but several weeks ago he noted a transient 10-second episode of flutter sensation.  He denies any chest pain or shortness of breath.  He remains active.  He continues to be on lisinopril at only 2.5 mg and metoprolol succinate 25 mg daily.  He continues to take Zetia/simvastatin combination 10/40 and over-the-counter omega-3 fatty acid.  He is diabetic on metformin which was recently increased to 500 mg twice a day.  He presents for yearly evaluation.  Past Medical History:  Diagnosis Date   Allergy    Atrial fibrillation (HCC)    a. diagnosed in 01/2017 --> s/p DCCV. Started on Xarelto for anticoagulation.    CAD (coronary artery disease)    a. s/p DES to LAD in 2005 b. patent by cath in 2007 with 30% LCx stenosis noted at that time c. normal NST in 05/2011   Chronic kidney disease    1 kidney stone   Diabetes mellitus without complication (HCC)    type 2, no meds needed   Hyperlipidemia    Hypertension    Myocardial infarction [facility])     Past Surgical History:  Procedure Laterality Date   ATRIAL FIBRILLATION ABLATION N/A 05/07/2019   Procedure: ATRIAL FIBRILLATION ABLATION;  Surgeon: Hillis Range, MD;  Location: [facility] INVASIVE CV LAB;  Service: Cardiovascular;  Laterality: N/A;   CARDIAC SURGERY  2005   stent   CORONARY STENT PLACEMENT     KNEE ARTHROSCOPY     left 2003, right 2005   LEFT HEART CATH AND CORONARY ANGIOGRAPHY N/A 12/15/2017   Procedure: LEFT HEART CATH AND CORONARY ANGIOGRAPHY;  Surgeon: Lennette Bihari, MD;  Location: [facility] INVASIVE CV LAB;  Service: Cardiovascular;  Laterality: N/A;   SHOULDER SURGERY      Allergies  Allergen Reactions   Aleve [Naproxen Sodium] Swelling and Other (See Comments)    Mouth swells   Oxycodone Other (See Comments)    Rendered the patient unable to fall asleep AND he "almost passed out"   Sulfa Antibiotics Hives   Trazodone And Nefazodone Other (See Comments)     Seeing colored lights   Vicodin [Hydrocodone-Acetaminophen] Other (See Comments)    Rendered the patient unable to fall asleep AND he "almost passed out"    Current Outpatient Medications  Medication Sig Dispense Refill   ezetimibe-simvastatin (VYTORIN) 10-40 MG tablet TAKE 1 TABLET BY MOUTH EVERY DAY 90 tablet 2   famotidine (PEPCID) 20 MG tablet TAKE 1 TABLET BY MOUTH EVERY DAY 90 tablet 0   icosapent Ethyl (VASCEPA) 1 g capsule Take 1 capsule (1 g total) by mouth 2 (two) times daily. 180 capsule 3   lisinopril (ZESTRIL) 2.5 MG tablet TAKE 1 TABLET BY MOUTH EVERY DAY 90 tablet 0   metFORMIN (GLUCOPHAGE) 500 MG tablet TAKE 2 TABLETS BY MOUTH TWICE A DAY (Patient taking differently: Take 500 mg by mouth 2 (two) times daily with a meal.) 360 tablet 0   metoprolol succinate (TOPROL-XL) 25 MG 24 hr tablet TAKE 1 TABLET (25 MG TOTAL) BY MOUTH DAILY. 90 tablet 0   Multiple Vitamins-Minerals (MULTIVITAMIN WITH MINERALS) tablet Take 1 tablet by mouth at bedtime.      nitroGLYCERIN (NITROSTAT) 0.4 MG SL tablet Place 1 tablet (0.4 mg total) under the tongue every 5 (five) minutes as needed for chest pain. 25 tablet 2   pantoprazole (PROTONIX) 40 MG tablet TAKE 1 TABLET DAILY 30 TO 60 MINUTES BEFORE FIRST MEAL OF THE DAY 90 tablet 1   PAZEO 0.7 % SOLN Place 1 drop into both eyes daily as needed (allergies).   4  sildenafil (VIAGRA) 100 MG tablet Take 1 tablet (100 mg total) by mouth daily as needed for erectile dysfunction. 15 tablet 5   trimethoprim-polymyxin b (POLYTRIM) ophthalmic solution Place 2 drops into the right eye every 4 (four) hours. 10 mL 0   XARELTO 20 MG TABS tablet TAKE 1 TABLET BY MOUTH DAILY WITH SUPPER. 90 tablet 0   No current facility-administered medications for this visit.    Social History   Socioeconomic History   Marital status: Married    Spouse name: Not on file   Number of children: Not on file   Years of education: Not on file   Highest education level: Not on  file  Occupational History   Not on file  Tobacco Use   Smoking status: Former    Current packs/day: 0.00    Average packs/day: 1 pack/day for 8.0 years (8.0 ttl pk-yrs)    Types: Cigarettes    Start date: 07/17/1979    Quit date: 07/17/1987    Years since quitting: 35.9   Smokeless tobacco: Never  Vaping Use   Vaping status: Never Used  Substance and Sexual Activity   Alcohol use: No    Alcohol/week: 0.0 standard drinks of alcohol   Drug use: No   Sexual activity: Not on file  Other Topics Concern   Not on file  Social History Narrative   Not on file   Social Drivers of Health   Financial Resource Strain: Not on file  Food Insecurity: Not on file  Transportation Needs: Not on file  Physical Activity: Not on file  Stress: Not on file  Social Connections: Not on file  Intimate Partner Violence: Not on file    Family History  Problem Relation Age of Onset   Diabetes Mother    Heart disease Father    Diabetes Father    Heart disease Paternal Grandfather    Colon cancer Neg Hx    Esophageal cancer Neg Hx    Stomach cancer Neg Hx    Rectal cancer Neg Hx    Social history is notable that he is married and has 2 children. There is no tobacco or alcohol use. He has an autistic son.   ROS General: Negative; No fevers, chills, or night sweats; there is purposeful weight loss HEENT: Negative; No changes in vision or hearing, sinus congestion, difficulty swallowing Pulmonary: Negative; No cough, wheezing, shortness of breath, hemoptysis Cardiovascular: See history of present illness:  GI: GERD GU: Negative; No dysuria, hematuria, or difficulty voiding Musculoskeletal: Left shoulder labrum tear Hematologic/Oncology: Negative; no easy bruising, bleeding Endocrine: Negative; no heat/cold intolerance; no diabetes Neuro: Negative; no changes in balance, headaches Skin: Negative; No rashes or skin lesions Psychiatric: Negative; No behavioral problems, depression Sleep:  Negative; No snoring, daytime sleepiness, hypersomnolence, bruxism, restless legs, hypnogognic hallucinations, no cataplexy Other comprehensive 14 point system review is negative.   PE BP 126/80   Pulse (!) 54   Ht 6\' 1"  (1.854 m)   Wt 218 lb (98.9 kg)   SpO2 98%   BMI 28.76 kg/m    Repeat blood pressure by me was 124/74  Wt Readings from Last 3 Encounters:  06/28/23 218 lb (98.9 kg)  04/18/23 210 lb (95.3 kg)  11/17/22 221 lb 12.8 oz (100.6 kg)   General: Alert, oriented, no distress.  Skin: normal turgor, no rashes, warm and dry HEENT: Normocephalic, atraumatic. Pupils equal round and reactive to light; sclera anicteric; extraocular muscles intact;  Nose without nasal septal hypertrophy Mouth/Parynx  benign; Mallinpatti scale 3 Neck: No JVD, no carotid bruits; normal carotid upstroke Lungs: clear to ausculatation and percussion; no wheezing or rales Chest wall: without tenderness to palpitation Heart: PMI not displaced, RRR, s1 s2 normal, 1/6 systolic murmur, no diastolic murmur, no rubs, gallops, thrills, or heaves Abdomen: soft, nontender; no hepatosplenomehaly, BS+; abdominal aorta nontender and not dilated by palpation. Back: no CVA tenderness Pulses 2+ Musculoskeletal: full range of motion, normal strength, no joint deformities Extremities: no clubbing cyanosis or edema, Homan's sign negative  Neurologic: grossly nonfocal; Cranial nerves grossly wnl Psychologic: Normal mood and affect    EKG Interpretation Date/Time:  Wednesday June 28 2023 10:54:15 EST Ventricular Rate:  54 PR Interval:  162 QRS Duration:  88 QT Interval:  418 QTC Calculation: 396 R Axis:   11  Text Interpretation: Sinus bradycardia Nonspecific T wave abnormality When compared with ECG of 11-Jun-2019 15:46, Nonspecific T wave abnormality, worse in Inferior leads Nonspecific T wave abnormality now evident in Anterolateral leads QT has shortened Confirmed by Nicki Guadalajara ([id number]) on 06/28/2023  11:14:34 AM     Sep 27, 2022 ECG (independently read by me): Sinus bradycardia at 58  March 23, 2022 ECG (independently read by me): NSR at 63, no ectopy, normal intervals  March 24, 2020 ECG (independently read by me): Normal sinus rhythm at 64 bpm.  No ectopy.  Normal intervals.  QTc interval 414 ms.  November 2020 ECG (independently read by me): NSR at 60; Normal intervals  October 18, 2017 ECG (independently read by me): Sinus rhythm at 64 bpm.  Normal intervals.  No ectopy.  March 2018 ECG (independently read by me): normal sinus rhythm at 61 bpm.  Normal intervals.  No ectopy.  No ST segment changes.  December 2018 ECG (independently read by me):  Sinus bradycardia at 55 bpm.  PR interal 168 ms.  QTc inteval 417 ms. No ST-T changes.  February 2018 ECG (independently read by me): Sinus bradycardia at 48 bpm.  No ST segment changes.  Normal intervals.  June 2017 ECG (independently read by me): Sinus bradycardia 58 bpm.  Normal intervals.  No ST segment changes.  September 2016 ECG (independently read by me): Sinus bradycardia 58 bpm.  Mild RV conduction delay.  September 2015 ECG:( Independently read by me): Sinus bradycardia 50 beats per minute.  No ST segment changes.  Normal intervals.   November 2014 ECG: Sinus bradycardia at 45 beats per minute. No significant ST-T change.  LABS:    Latest Ref Rng & Units 03/10/2023   10:06 AM 07/14/2022    8:29 AM 03/07/2022   11:32 AM  BMP  Glucose 70 - 99 mg/dL 295  621  308   BUN 6 - 23 mg/dL 17  15  20    Creatinine 0.40 - 1.50 mg/dL 6.57  8.46  9.62   BUN/Creat Ratio 9 - 20  15    Sodium 135 - 145 mEq/L 136  136  134   Potassium 3.5 - 5.1 mEq/L 4.2  4.6  4.1   Chloride 96 - 112 mEq/L 101  100  100   CO2 19 - 32 mEq/L 28  23  26    Calcium 8.4 - 10.5 mg/dL 9.8  9.5  9.6       Latest Ref Rng & Units 03/10/2023   10:06 AM 07/14/2022    8:29 AM 03/07/2022   11:32 AM  Hepatic Function  Total Protein 6.0 - 8.3 g/dL 7.9  7.5  7.8  Albumin 3.5 - 5.2 g/dL 4.4  4.7  4.6   AST 0 - 37 U/L 17  20  19    ALT 0 - 53 U/L 17  19  18    Alk Phosphatase 39 - 117 U/L 58  68  55   Total Bilirubin 0.2 - 1.2 mg/dL 0.9  0.6  0.8   Bilirubin, Direct 0.0 - 0.3 mg/dL   0.2       Latest Ref Rng & Units 03/10/2023   10:06 AM 03/07/2022   11:32 AM 02/18/2020   10:23 AM  CBC  WBC 4.0 - 10.5 K/uL 5.4  7.0  5.6   Hemoglobin 13.0 - 17.0 g/dL 08.6  57.8  46.9   Hematocrit 39.0 - 52.0 % 41.2  43.1  43.7   Platelets 150.0 - 400.0 K/uL 258.0  239.0  234    Lab Results  Component Value Date   MCV 92.2 03/10/2023   MCV 91.9 03/07/2022   MCV 92.8 02/18/2020   Lab Results  Component Value Date   TSH 1.71 12/05/2017   Lab Results  Component Value Date   HGBA1C 7.3 (H) 03/10/2023   Lipid Panel     Component Value Date/Time   CHOL 121 03/10/2023 1006   CHOL 127 07/14/2022 0829   CHOL 99 04/02/2013 0903   TRIG 126.0 03/10/2023 1006   TRIG 59 04/02/2013 0903   HDL 38.40 (L) 03/10/2023 1006   HDL 39 (L) 07/14/2022 0829   HDL 42 04/02/2013 0903   CHOLHDL 3 03/10/2023 1006   VLDL 25.2 03/10/2023 1006   LDLCALC 58 03/10/2023 1006   LDLCALC 65 07/14/2022 0829   LDLCALC 78 02/18/2020 1023   LDLCALC 45 04/02/2013 0903   LDLDIRECT 101.0 03/07/2022 1132     RADIOLOGY: No results found.  IMPRESSION:  1. Coronary artery disease involving native coronary artery of native heart with angina pectoris (HCC)   2. History of percutaneous coronary intervention: October 2005, proximal to mid LAD   3. Paroxysmal atrial fibrillation (HCC)   4. Status post ablation of atrial fibrillation: Dr. Johney Frame, May 07, 2019   5. Essential hypertension   6. Hyperlipidemia with target LDL less than 70   7. Type 2 diabetes mellitus without complication, without long-term current use of insulin [facility])     ASSESSMENT AND PLAN: Michael Livingston is a 63 year old white male who is 20 years status post stenting to his proximal to mid LAD in October  2005.  Catheterization in October 2007 demonstrated a widely patent LAD stent and he had mild 30% narrowing.  A nuclear perfusion study in January 2013 continued to show normal perfusion.  He has not had any recurrent anginal symptomatology.  Commencing in 2018 he had episodes of recurrent paroxysmal atrial fibrillation and at times palpitations were precipitated during heat and stress in the setting of dehydration.  Due to increased frequency of PAF, I ultimately referred him to Dr. Johney Frame and he underwent successful atrial fibrillation ablation on May 07, 2019 by Dr. Johney Frame.  Subsequently, he has been maintaining sinus rhythm.  He is unaware of any definitive recurrent episodes of atrial fibrillation although several weeks ago he noticed a transient flutter sensation which lasted less than 10 seconds.  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  His blood pressure today continues to be stable on low-dose lisinopril at 2.5 mg and metoprolol succinate 25 mg daily.  He now takes over-the-counter fish oil  along with his Vytorin 10/40.  Lipid studies from March 10, 2023 by Dr. Evelena Peat showed total cholesterol 121, LDL cholesterol 58, triglycerides 126, and HDL cholesterol 38.4.  Hemoglobin A1c was increased at 7.3 and his metformin dose was increased from 500 mg daily to twice a day.  He continues to take pantoprazole for GERD.  He takes pantoprazole for GERD.  He is aware of my plans for retirement later this year.  I will transition him to the care of Dr. Royann Shivers for cardiology follow-up.  He will continue to follow with Dr. Caryl Never for primary care.  I have thoroughly enjoyed taking care of him over these 20 past years and also see his father.   Nicki Guadalajara, MD  06/28/2023 4:09 PM

## 2023-06-28 NOTE — Patient Instructions (Addendum)
Medication Instructions:  No medication changes were made during today's visit  *If you need a refill on your cardiac medications before your next appointment, please call your pharmacy*   Lab Work: No labs were ordered during today's visit.  If you have labs (blood work) drawn today and your tests are completely normal, you will receive your results only by: MyChart Message (if you have MyChart) OR A paper copy in the mail If you have any lab test that is abnormal or we need to change your treatment, we will call you to review the results.   Testing/Procedures: No procedures ordered today.    Follow-Up: At [facility], you and your health needs are our priority.  As part of our continuing mission to provide you with exceptional heart care, we have created designated Provider Care Teams.  These Care Teams include your primary Cardiologist (physician) and Advanced Practice Providers (APPs -  Physician Assistants and Nurse Practitioners) who all work together to provide you with the care you need, when you need it.  We recommend signing up for the patient portal called "MyChart".  Sign up information is provided on this After Visit Summary.  MyChart is used to connect with patients for Virtual Visits (Telemedicine).  Patients are able to view lab/test results, encounter notes, upcoming appointments, etc.  Non-urgent messages can be sent to your provider as well.   To learn more about what you can do with MyChart, go to [email or web address].    Your next appointment:   1 year(s)  Provider:   Dr. Thurmon Fair     Other Instructions  If you have any questions or concerns regarding your c-pap, bi-pap or sleep accessories, please contact Brandie Rorie at [phone].    Thank you for choosing [facility]!            1st Floor: - Lobby - Registration  - Pharmacy  - Lab - Cafe   2nd Floor: - PV Lab - Diagnostic Testing (echo, CT, nuclear med)   3rd  Floor: - Vacant   4th Floor: - TCTS (cardiothoracic surgery) - AFib Clinic - Structural Heart Clinic - Vascular Surgery  - Vascular Ultrasound   5th Floor: - HeartCare Cardiology (general and EP) - Clinical Pharmacy for coumadin, hypertension, lipid, weight-loss medications, and med management appointments      Valet parking services will be available as well.

## 2023-08-18 ENCOUNTER — Other Ambulatory Visit: Payer: Self-pay | Admitting: Family Medicine

## 2023-09-12 ENCOUNTER — Other Ambulatory Visit: Payer: Self-pay | Admitting: Family Medicine

## 2023-09-16 ENCOUNTER — Other Ambulatory Visit: Payer: Self-pay | Admitting: Family Medicine

## 2023-09-18 ENCOUNTER — Other Ambulatory Visit: Payer: Self-pay | Admitting: Family Medicine

## 2023-09-28 ENCOUNTER — Telehealth: Payer: Self-pay | Admitting: Cardiovascular Disease

## 2023-09-28 NOTE — Telephone Encounter (Signed)
 Spoke with pt who stated that he had asked someone (sounded like another individual in the cardiology field) if he should stop his blood thinner prior to his gum procedure and he was told no he should be fine to not stop it. Pt stated that he has had multiple episodes of bleeding and even the oral surgeon stated they probably should have stopped the medication. Explained to pt that normally with these dental procedures we do hold the blood thinners for a set number of days prior to the procedure depending on the provider preferences. Pt had asked about switching to Plavix  and it was explained that Plavix  is an antiplatelet which is not the same as a blood thinner such as Xarelto . Pt stated he had been placed on Xarelto  for afib a few years ago but has not had any instances of afib since his ablation in 2020. Pt stated he would like to stop the blood thinner if possible. He did stop it himself last night due to the bleeding and has not had an episode of bleeding since. Will forward to DOD (Dr. Audery Blazing 5/22) since pt's primary cardiologist (Dr. Loetta Ringer) is out of the office.

## 2023-09-28 NOTE — Telephone Encounter (Signed)
 Spoke with pt and explained Dr. Lourdes Roy recommendations of holding Xarelto  3-5 days and then discussing stopping Xarelto  with Dr. Loetta Ringer. Encounter will be forwarded to Dr. Loetta Ringer to review.

## 2023-09-28 NOTE — Telephone Encounter (Signed)
 Pt c/o medication issue:  1. Name of Medication:  XARELTO  20 MG TABS tablet  2. How are you currently taking this medication (dosage and times per day)?   3. Are you having a reaction (difficulty breathing--STAT)?   4. What is your medication issue?   Patient says he had a gum procedure and got stitches. They busted several times and he's had a lot of bleeding the past few days. Yesterday he went back and got more stitches. By 9:00 PM he was bleeding profusely again. He says he lost about 5oz of blood. He says went he was having the procedure he initially asked if he should go off blood thinner but he says they said no, and that he should be fine. Patient says he went ahead and stopped it on his own and would like to know if he can be switched to Plavix . Please advise.

## 2023-10-02 NOTE — Telephone Encounter (Signed)
 Agree with holding Xarelto  for a minimum of 3 days prior to his dental procedure

## 2023-10-15 ENCOUNTER — Other Ambulatory Visit: Payer: Self-pay | Admitting: Family Medicine

## 2023-10-20 ENCOUNTER — Encounter (INDEPENDENT_AMBULATORY_CARE_PROVIDER_SITE_OTHER): Payer: Self-pay | Admitting: Otolaryngology

## 2023-10-20 ENCOUNTER — Ambulatory Visit (INDEPENDENT_AMBULATORY_CARE_PROVIDER_SITE_OTHER): Payer: BC Managed Care – PPO | Admitting: Otolaryngology

## 2023-10-20 VITALS — BP 129/73 | HR 62 | Ht 73.0 in | Wt 205.0 lb

## 2023-10-20 DIAGNOSIS — H6123 Impacted cerumen, bilateral: Secondary | ICD-10-CM

## 2023-10-20 DIAGNOSIS — H9313 Tinnitus, bilateral: Secondary | ICD-10-CM | POA: Diagnosis not present

## 2023-10-20 DIAGNOSIS — R0981 Nasal congestion: Secondary | ICD-10-CM | POA: Diagnosis not present

## 2023-10-20 DIAGNOSIS — R0982 Postnasal drip: Secondary | ICD-10-CM

## 2023-10-20 DIAGNOSIS — J310 Chronic rhinitis: Secondary | ICD-10-CM | POA: Diagnosis not present

## 2023-10-22 DIAGNOSIS — H9313 Tinnitus, bilateral: Secondary | ICD-10-CM | POA: Insufficient documentation

## 2023-10-22 DIAGNOSIS — R0982 Postnasal drip: Secondary | ICD-10-CM | POA: Insufficient documentation

## 2023-10-22 NOTE — Progress Notes (Signed)
 Patient ID: Michael Livingston, male   DOB: [birth year], 63 y.o.   MRN: [id number]  CC: Chronic nasal congestion, bilateral tinnitus  HPI: The patient is a 63 year old male who presents today complaining of chronic nasal congestion and bilateral tinnitus.  The patient was last seen in December 2024.  At that time, he was noted to have chronic rhinitis with nasal mucosal congestion and nasal drainage.  He was treated with Flonase  nasal spray.  He returns today complaining of increasing nasal congestion and nasal drainage during the allergy  season.  He has also noted bilateral high-pitched tinnitus.  The tinnitus is nonpulsatile.  He denies any otalgia, otorrhea, facial pain, fever, or visual change.  Exam: General: Communicates without difficulty, well nourished, no acute distress. Head: Normocephalic, no evidence injury, no tenderness, facial buttresses intact without stepoff. Face/sinus: No tenderness to palpation and percussion. Facial movement is normal and symmetric. Eyes: PERRL, EOMI. No scleral icterus, conjunctivae clear. Neuro: CN II exam reveals vision grossly intact.  No nystagmus at any point of gaze. Ears: Auricles well formed without lesions.  Bilateral cerumen impaction.  Nose: External evaluation reveals normal support and skin without lesions.  Dorsum is intact.  Anterior rhinoscopy reveals congested mucosa over anterior aspect of inferior turbinates and intact septum.  No purulence noted. Oral:  Oral cavity and oropharynx are intact, symmetric, without erythema or edema.  Mucosa is moist without lesions. Neck: Full range of motion without pain.  There is no significant lymphadenopathy.  No masses palpable.  Thyroid  bed within normal limits to palpation.  Parotid glands and submandibular glands equal bilaterally without mass.  Trachea is midline. Neuro:  CN 2-12 grossly intact.   Procedure: Bilateral cerumen disimpaction Anesthesia: None Description: Under the operating microscope, the cerumen  is carefully removed with a combination of cerumen currette, alligator forceps, and suction catheters.  After the cerumen is removed, the TMs are noted to be normal.  No mass, erythema, or lesions. The patient tolerated the procedure well.    Assessment: 1.  Bilateral cerumen impaction.  After the disimpaction procedure, both tympanic membranes and middle ear spaces are noted to be normal. 2.  Chronic rhinitis with nasal mucosal congestion.  The patient also has frequent postnasal drainage. 3.  Bilateral subjective tinnitus.  Please ear canals and tympanic membranes are noted to be normal.  Plan: 1.  Otomicroscopy with bilateral cerumen disimpaction. 2.  Continue with Flonase  nasal spray 2 sprays each nostril daily. 3.  Nasal saline irrigation is encouraged. 4.  The strategies to cope with tinnitus, including the use of masker, hearing aids, tinnitus retraining therapy, and avoidance of caffeine and alcohol are discussed. 5.  The patient will return for reevaluation in 6 months.

## 2023-10-31 ENCOUNTER — Emergency Department (HOSPITAL_BASED_OUTPATIENT_CLINIC_OR_DEPARTMENT_OTHER): Admitting: Radiology

## 2023-10-31 ENCOUNTER — Emergency Department (HOSPITAL_BASED_OUTPATIENT_CLINIC_OR_DEPARTMENT_OTHER)
Admission: EM | Admit: 2023-10-31 | Discharge: 2023-10-31 | Disposition: A | Discharge status: Home / Self Care | Attending: Emergency Medicine | Admitting: Emergency Medicine

## 2023-10-31 ENCOUNTER — Encounter (HOSPITAL_BASED_OUTPATIENT_CLINIC_OR_DEPARTMENT_OTHER): Payer: Self-pay | Admitting: Emergency Medicine

## 2023-10-31 DIAGNOSIS — S8261XA Displaced fracture of lateral malleolus of right fibula, initial encounter for closed fracture: Secondary | ICD-10-CM | POA: Diagnosis not present

## 2023-10-31 DIAGNOSIS — W1789XA Other fall from one level to another, initial encounter: Secondary | ICD-10-CM | POA: Diagnosis not present

## 2023-10-31 DIAGNOSIS — Z7984 Long term (current) use of oral hypoglycemic drugs: Secondary | ICD-10-CM | POA: Insufficient documentation

## 2023-10-31 DIAGNOSIS — S82391A Other fracture of lower end of right tibia, initial encounter for closed fracture: Secondary | ICD-10-CM

## 2023-10-31 DIAGNOSIS — S99911A Unspecified injury of right ankle, initial encounter: Secondary | ICD-10-CM | POA: Diagnosis present

## 2023-10-31 DIAGNOSIS — W19XXXA Unspecified fall, initial encounter: Secondary | ICD-10-CM

## 2023-10-31 DIAGNOSIS — Z79899 Other long term (current) drug therapy: Secondary | ICD-10-CM | POA: Diagnosis not present

## 2023-10-31 MED ORDER — FENTANYL CITRATE PF 50 MCG/ML IJ SOSY
50.0000 ug | PREFILLED_SYRINGE | Freq: Once | INTRAMUSCULAR | Status: AC
  Administered 2023-10-31: 50 ug via INTRAMUSCULAR
  Filled 2023-10-31: qty 1

## 2023-10-31 MED ORDER — METHOCARBAMOL 500 MG PO TABS
500.0000 mg | ORAL_TABLET | Freq: Three times a day (TID) | ORAL | 0 refills | Status: AC | PRN
Start: 2023-10-31 — End: ?

## 2023-10-31 MED ORDER — METHOCARBAMOL 500 MG PO TABS
750.0000 mg | ORAL_TABLET | Freq: Once | ORAL | Status: AC
  Administered 2023-10-31: 750 mg via ORAL
  Filled 2023-10-31: qty 2

## 2023-10-31 NOTE — Discharge Instructions (Addendum)
 It was a pleasure taking care of you today.  Based on your history, physical exam, and imaging today I feel you are safe for discharge.  Today the x-ray of your right leg and ankle could not rule out a possible nondisplaced posterior malleolus fracture.  Due to this you have been placed in a posterior short leg splint, please wear the splint until you follow-up with orthopedics.  An Ortho referral has been attached to your discharge paperwork, please call to make an appointment.  Until you follow-up with orthopedics, please remain nonweightbearing on your right foot/leg.  We declined giving crutches to you today, as you state you have some at home from a previous injury.  Please return to the emergency department or seek further medical care if experiencing the following symptoms including but not limited to severe pain, severe swelling, weakness, chest pain, shortness of breath, numbness/tingling, severe headache.  Please use rest, ice, compression, and elevation to help with pain and injury at home.  You may take over-the-counter Tylenol  as discussed, please do not exceed the maximum dose of 4000 mg/day. Please do not drive when taking muscle relaxant.

## 2023-10-31 NOTE — ED Triage Notes (Signed)
 Fall from roof ( 10 foot) Landed on feet Right ankle pain On xarelto . Around 1 pm

## 2023-10-31 NOTE — ED Notes (Signed)
Ice packs applied to both ankles 

## 2023-10-31 NOTE — ED Provider Notes (Signed)
 [facility] EMERGENCY DEPARTMENT AT [facility] Provider Note   CSN: [id number] Arrival date & time: 10/31/23  1346     Patient presents with: Michael Livingston is a 63 y.o. male who presents emergency department with a chief complaint of fall.  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  Patient states that he landed on a pile of shingles directly on his 2 feet.  Patient states that when he hit the ground he heard a pop in his right ankle.  Patient states that he attempted to walk after the incident but was unable to.  Currently complaining of right knee, right leg, right ankle pain as well as left ankle pain.  Patient denies hitting his head, neck.  Denies chest pain, abdominal pain, hip, or pelvic pain.  Past medical history significant for type 2 diabetes, paroxysmal A-fib on blood thinner, heart attack.  {Add pertinent medical, surgical, social history, OB history to YEP:67052}  Fall       Prior to Admission medications   Medication Sig Start Date End Date Taking? Authorizing Provider  ezetimibe -simvastatin  (VYTORIN ) 10-40 MG tablet TAKE 1 TABLET BY MOUTH EVERY DAY 12/26/22   Burnard Debby LABOR, MD  famotidine  (PEPCID ) 20 MG tablet TAKE 1 TABLET BY MOUTH EVERY DAY 10/16/23   Burchette, Wolm ORN, MD  icosapent  Ethyl (VASCEPA ) 1 g capsule Take 1 capsule (1 g total) by mouth 2 (two) times daily. 09/27/22   Burnard Debby LABOR, MD  lisinopril  (ZESTRIL ) 2.5 MG tablet TAKE 1 TABLET BY MOUTH EVERY DAY 06/05/23   Burchette, Wolm ORN, MD  metFORMIN  (GLUCOPHAGE ) 500 MG tablet TAKE 2 TABLETS BY MOUTH TWICE A DAY 09/18/23   Burchette, Wolm ORN, MD  metoprolol  succinate (TOPROL -XL) 25 MG 24 hr tablet TAKE 1 TABLET (25 MG TOTAL) BY MOUTH DAILY. 10/16/23   Burchette, Wolm ORN, MD  Multiple Vitamins-Minerals (MULTIVITAMIN WITH MINERALS) tablet Take 1 tablet by mouth at bedtime.     [provider]  nitroGLYCERIN   (NITROSTAT ) 0.4 MG SL tablet Place 1 tablet (0.4 mg total) under the tongue every 5 (five) minutes as needed for chest pain. 12/02/20   Burchette, Wolm ORN, MD  pantoprazole  (PROTONIX ) 40 MG tablet TAKE 1 TABLET DAILY 30 TO 60 MINUTES BEFORE FIRST MEAL OF THE DAY 06/26/23   Burchette, Wolm ORN, MD  PAZEO 0.7 % SOLN Place 1 drop into both eyes daily as needed (allergies).  10/03/15   [provider]  sildenafil  (VIAGRA ) 100 MG tablet Take 1 tablet (100 mg total) by mouth daily as needed for erectile dysfunction. 03/07/23   Burchette, Wolm ORN, MD  trimethoprim -polymyxin b  (POLYTRIM ) ophthalmic solution Place 2 drops into the right eye every 4 (four) hours. 03/07/22   Burchette, Wolm ORN, MD  XARELTO  20 MG TABS tablet TAKE 1 TABLET BY MOUTH DAILY WITH SUPPER. 10/16/23   Burchette, Wolm ORN, MD    Allergies: Aleve [naproxen sodium], Oxycodone, Sulfa antibiotics, Trazodone  and nefazodone, and Vicodin [hydrocodone-acetaminophen ]    Review of Systems  Musculoskeletal:  Positive for joint swelling (Right knee, right ankle, left ankle).    Updated Vital Signs BP 117/83 (BP Location: Left Arm)   Pulse 73   Temp 99.4 F (37.4 C)   Resp 18   SpO2 100%   Physical Exam Vitals and nursing note reviewed.  Constitutional:      General: He is awake. He is not in  acute distress.    Appearance: Normal appearance. He is not ill-appearing, toxic-appearing or diaphoretic.  HENT:     Head: Normocephalic and atraumatic.     Comments: No obvious bruising or lacerations to head, no raccoon eyes, no Battle sign  Eyes:     General: No scleral icterus.    Extraocular Movements: Extraocular movements intact.     Pupils: Pupils are equal, round, and reactive to light.    Cardiovascular:     Rate and Rhythm: Normal rate and regular rhythm.     Heart sounds: Normal heart sounds.  Pulmonary:     Effort: Pulmonary effort is normal. No respiratory distress.     Breath sounds: Normal breath sounds. No wheezing,  rhonchi or rales.  Chest:     Chest wall: No tenderness.  Abdominal:     General: Abdomen is flat. There is no distension.     Palpations: Abdomen is soft.     Tenderness: There is no abdominal tenderness. There is no guarding.   Musculoskeletal:     Cervical back: Normal range of motion. No rigidity or tenderness.     Comments: Tenderness of medial right knee along joint line, mild effusion Tenderness of anterior tibia of right leg, bruising and abrasion present Significant effusion present of right ankle with ecchymosis, pain with palpation of medial and lateral malleolus, anterior ankle joint No tenderness with palpation of right foot or toes, right lower extremity neurovascularly intact  No pain with palpation of left knee, left tibia, fibula Generalized tenderness to palpation of left ankle, no tenderness with palpation of the left foot, left toes Left lower extremity neurovascularly intact  No tenderness with palpation of pelvis, pelvis feels stable, no tenderness with palpation of bilateral femurs No tenderness with palpation of cervical, thoracic, lumbar spine   Skin:    General: Skin is warm and dry.     Findings: Bruising (Bilateral ankles, right tibia, fibula) present.   Neurological:     General: No focal deficit present.     Mental Status: He is alert and oriented to person, place, and time.     Sensory: No sensory deficit.     Motor: No weakness.     Coordination: Coordination normal.   Psychiatric:        Mood and Affect: Mood normal.        Behavior: Behavior normal. Behavior is cooperative.     (all labs ordered are listed, but only abnormal results are displayed) Labs Reviewed - No data to display  EKG: None  Radiology: No results found.  {Document cardiac monitor, telemetry assessment procedure when appropriate:32947} Procedures   Medications Ordered in the ED  fentaNYL  (SUBLIMAZE ) injection 50 mcg (has no administration in time range)       {Click here for ABCD2, HEART and other calculators REFRESH Note before signing:1}                              Medical Decision Making Amount and/or Complexity of Data Reviewed Radiology: ordered.  Risk Prescription drug management.   Patient presents to the ED for concern of fall off roof, this involves an extensive number of treatment options, and is a complaint that carries with it a high risk of complications and morbidity.  The differential diagnosis includes fracture, head injury, cervical spine injury, spine fracture, compression fracture, extremity fracture, soft tissue injury, ligament/tendon injury   Co morbidities that complicate the patient evaluation  Type 2 diabetes, hyperlipidemia, paroxysmal A-fib on anticoagulant, history of MI per patient   Lab Tests:  I Ordered, and personally interpreted labs.  The pertinent results include:  ***   Imaging Studies ordered:  I ordered imaging studies including x-ray of right knee, right tib-fib, right ankle, left ankle, lumbar spine I independently visualized and interpreted imaging which showed *** I agree with the radiologist interpretation   Cardiac Monitoring:  The patient was maintained on a cardiac monitor.  I personally viewed and interpreted the cardiac monitored which showed an underlying rhythm of: ***   Medicines ordered and prescription drug management:  I ordered medication including ***  for ***  Reevaluation of the patient after these medicines showed that the patient {resolved/improved/worsened:23923::improved} I have reviewed the patients home medicines and have made adjustments as needed   Test Considered:  ***   Critical Interventions:  ***   Consultations Obtained:  I requested consultation with the ***,  and discussed lab and imaging findings as well as pertinent plan - they recommend: ***   Problem List / ED Course:  ***   Reevaluation:  After the interventions noted above, I  reevaluated the patient and found that they have :{resolved/improved/worsened:23923::improved}   Social Determinants of Health:  ***   Dispostion:  After consideration of the diagnostic results and the patients response to treatment, I feel that the patent would benefit from ***.    {Document critical care time when appropriate  Document review of labs and clinical decision tools ie CHADS2VASC2, etc  Document your independent review of radiology images and any outside records  Document your discussion with family members, caretakers and with consultants  Document social determinants of health affecting pt's care  Document your decision making why or why not admission, treatments were needed:32947:::1}   Final diagnoses:  None    ED Discharge Orders     None

## 2023-11-12 ENCOUNTER — Other Ambulatory Visit: Payer: Self-pay | Admitting: Family Medicine

## 2023-11-26 ENCOUNTER — Other Ambulatory Visit: Payer: Self-pay | Admitting: Family Medicine

## 2023-12-08 ENCOUNTER — Other Ambulatory Visit: Payer: Self-pay | Admitting: Family Medicine

## 2023-12-20 ENCOUNTER — Ambulatory Visit: Admitting: Family Medicine

## 2023-12-20 ENCOUNTER — Ambulatory Visit: Payer: Self-pay | Admitting: Family Medicine

## 2023-12-20 ENCOUNTER — Encounter: Payer: Self-pay | Admitting: Family Medicine

## 2023-12-20 VITALS — BP 120/66 | HR 60 | Temp 97.7°F | Wt 216.9 lb

## 2023-12-20 DIAGNOSIS — R6882 Decreased libido: Secondary | ICD-10-CM | POA: Diagnosis not present

## 2023-12-20 DIAGNOSIS — E119 Type 2 diabetes mellitus without complications: Secondary | ICD-10-CM | POA: Diagnosis not present

## 2023-12-20 DIAGNOSIS — R0609 Other forms of dyspnea: Secondary | ICD-10-CM | POA: Diagnosis not present

## 2023-12-20 DIAGNOSIS — Z7984 Long term (current) use of oral hypoglycemic drugs: Secondary | ICD-10-CM

## 2023-12-20 DIAGNOSIS — E785 Hyperlipidemia, unspecified: Secondary | ICD-10-CM

## 2023-12-20 LAB — LIPID PANEL
Cholesterol: 117 mg/dL (ref 0–200)
HDL: 36.2 mg/dL — ABNORMAL LOW (ref >39.00)
LDL Cholesterol: 48 mg/dL (ref 0–99)
NonHDL: 80.74
Total CHOL/HDL Ratio: 3
Triglycerides: 164 mg/dL — ABNORMAL HIGH (ref 0.0–149.0)
VLDL: 32.8 mg/dL (ref 0.0–40.0)

## 2023-12-20 LAB — COMPREHENSIVE METABOLIC PANEL WITH GFR
ALT: 14 U/L (ref 0–53)
AST: 14 U/L (ref 0–37)
Albumin: 4.4 g/dL (ref 3.5–5.2)
Alkaline Phosphatase: 53 U/L (ref 39–117)
BUN: 16 mg/dL (ref 6–23)
CO2: 25 meq/L (ref 19–32)
Calcium: 9.7 mg/dL (ref 8.4–10.5)
Chloride: 101 meq/L (ref 96–112)
Creatinine, Ser: 0.84 mg/dL (ref 0.40–1.50)
GFR: 94.45 mL/min (ref >60.00)
Glucose, Bld: 166 mg/dL — ABNORMAL HIGH (ref 70–99)
Potassium: 4.2 meq/L (ref 3.5–5.1)
Sodium: 136 meq/L (ref 135–145)
Total Bilirubin: 0.5 mg/dL (ref 0.2–1.2)
Total Protein: 8 g/dL (ref 6.0–8.3)

## 2023-12-20 LAB — HEMOGLOBIN A1C: Hgb A1c MFr Bld: 7.3 % — ABNORMAL HIGH (ref 4.6–6.5)

## 2023-12-20 LAB — MICROALBUMIN / CREATININE URINE RATIO
Creatinine,U: 82.3 mg/dL
Microalb Creat Ratio: UNDETERMINED mg/g (ref 0.0–30.0)
Microalb, Ur: <0.7 mg/dL

## 2023-12-20 LAB — TESTOSTERONE: Testosterone: 246.98 ng/dL — ABNORMAL LOW (ref 300.00–890.00)

## 2023-12-20 MED ORDER — METFORMIN HCL 500 MG PO TABS: 1000.0000 mg | ORAL_TABLET | Freq: Two times a day (BID) | ORAL | 3 refills | Status: AC

## 2023-12-20 MED ORDER — PANTOPRAZOLE SODIUM 40 MG PO TBEC: DELAYED_RELEASE_TABLET | ORAL | 3 refills | Status: AC

## 2023-12-20 NOTE — Progress Notes (Signed)
 Established Patient Office Visit  Subjective   Patient ID: Michael Livingston, male    DOB: 01-Nov-1960  Age: 63 y.o. MRN: [id number]  Chief Complaint  Patient presents with   Medical Management of Chronic Issues    HPI   Michael Livingston is here for medical follow-up.  He has history of CAD, atrial fibrillation, pulmonary sarcoidosis, GERD, type 2 diabetes, hyperlipidemia.  Recent injury to his right ankle after fall from a roof.  He had tibial fracture.  Followed closely with orthopedics.  Medications were reviewed and include metformin , metoprolol , Xarelto , Protonix , lisinopril , Vytorin , Vascepa .  Needs refills of metformin  and pantoprazole .  GERD currently controlled.  Denies any recent chest pains.  Needs follow-up labs today.  Last A1c was slightly elevated at 7.3%.  We had suggested consideration for Jardiance but he declined and we did increase his metformin  at that time.  He does complain of some decreased libido and requesting testosterone  level.  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 diagnosed in 01/2017 --> s/p DCCV. Started on Xarelto  for anticoagulation.    CAD (coronary artery disease)    a. s/p DES to LAD in 2005 b. patent by cath in 2007 with 30% LCx stenosis noted at that time c. normal NST in 05/2011   Chronic kidney disease    1 kidney stone   Diabetes mellitus without complication (HCC)    type 2, no meds needed   Hyperlipidemia    Hypertension    Myocardial infarction [facility])    Past Surgical History:  Procedure Laterality Date   ATRIAL FIBRILLATION ABLATION N/A 05/07/2019   Procedure: ATRIAL FIBRILLATION ABLATION;  Surgeon: Kelsie Agent, MD;  Location: [facility] INVASIVE CV LAB;  Service: Cardiovascular;  Laterality: N/A;   CARDIAC SURGERY  2005   stent   CORONARY STENT PLACEMENT     KNEE ARTHROSCOPY     left 2003, right 2005   LEFT HEART CATH AND CORONARY  ANGIOGRAPHY N/A 12/15/2017   Procedure: LEFT HEART CATH AND CORONARY ANGIOGRAPHY;  Surgeon: Burnard Debby LABOR, MD;  Location: [facility] INVASIVE CV LAB;  Service: Cardiovascular;  Laterality: N/A;   SHOULDER SURGERY      reports that he quit smoking about 36 years ago. His smoking use included cigarettes. He started smoking about 44 years ago. He has a 8 pack-year smoking history. He has never used smokeless tobacco. He reports that he does not drink alcohol and does not use drugs. family history includes Diabetes in his father and mother; Heart disease in his father and paternal grandfather. Allergies  Allergen Reactions   Aleve [Naproxen Sodium] Swelling and Other (See Comments)    Mouth swells   Oxycodone Other (See Comments)    Rendered the patient unable to fall asleep AND he almost passed out   Sulfa Antibiotics Hives   Trazodone  And Nefazodone Other (See Comments)    Seeing colored lights   Vicodin [Hydrocodone-Acetaminophen ] Other (See Comments)    Rendered the patient unable to fall asleep AND he almost passed out    Review of Systems  Constitutional:  Negative for malaise/fatigue.  Eyes:  Negative for blurred vision.  Respiratory:  Negative for shortness of breath.   Cardiovascular:  Negative for chest pain.  Gastrointestinal:  Negative for abdominal pain.  Neurological:  Negative for dizziness, weakness and headaches.      Objective:  BP 120/66   Pulse 60   Temp 97.7 F (36.5 C) (Oral)   Wt 216 lb 14.4 oz (98.4 kg)   SpO2 94%   BMI 28.62 kg/m  BP Readings from Last 3 Encounters:  12/20/23 120/66  10/31/23 (!) 144/88  10/20/23 129/73   Wt Readings from Last 3 Encounters:  12/20/23 216 lb 14.4 oz (98.4 kg)  10/20/23 205 lb (93 kg)  06/28/23 218 lb (98.9 kg)      Physical Exam Vitals reviewed.  Constitutional:      General: He is not in acute distress.    Appearance: He is well-developed. He is not ill-appearing.  Eyes:     Pupils: Pupils are equal,  round, and reactive to light.  Neck:     Thyroid : No thyromegaly.  Cardiovascular:     Rate and Rhythm: Normal rate and regular rhythm.  Pulmonary:     Effort: Pulmonary effort is normal. No respiratory distress.     Breath sounds: Normal breath sounds. No wheezing or rales.  Musculoskeletal:     Cervical back: Neck supple.  Neurological:     Mental Status: He is alert and oriented to person, place, and time.      No results found for any visits on 12/20/23.  Last CBC Lab Results  Component Value Date   WBC 5.4 03/10/2023   HGB 14.0 03/10/2023   HCT 41.2 03/10/2023   MCV 92.2 03/10/2023   MCH 31.8 02/18/2020   RDW 12.6 03/10/2023   PLT 258.0 03/10/2023   Last metabolic panel Lab Results  Component Value Date   GLUCOSE 138 (H) 03/10/2023   NA 136 03/10/2023   K 4.2 03/10/2023   CL 101 03/10/2023   CO2 28 03/10/2023   BUN 17 03/10/2023   CREATININE 0.97 03/10/2023   GFR 85.01 03/10/2023   CALCIUM 9.8 03/10/2023   PROT 7.9 03/10/2023   ALBUMIN 4.4 03/10/2023   LABGLOB 2.8 07/14/2022   AGRATIO 1.7 07/14/2022   BILITOT 0.9 03/10/2023   ALKPHOS 58 03/10/2023   AST 17 03/10/2023   ALT 17 03/10/2023   ANIONGAP 10 09/02/2019   Last lipids Lab Results  Component Value Date   CHOL 121 03/10/2023   HDL 38.40 (L) 03/10/2023   LDLCALC 58 03/10/2023   LDLDIRECT 101.0 03/07/2022   TRIG 126.0 03/10/2023   CHOLHDL 3 03/10/2023   Last hemoglobin A1c Lab Results  Component Value Date   HGBA1C 7.3 (H) 03/10/2023      The ASCVD Risk score (Arnett DK, et al., 2019) failed to calculate for the following reasons:   Risk score cannot be calculated because patient has a medical history suggesting prior/existing ASCVD    Assessment & Plan:   #1 type 2 diabetes.  Last A1c 7.3%.  Recheck A1c today.  Refill metformin  for 1 year.  Check urine microalbumin screen.  If A1c still up consider Jardiance  especially with his history of CAD Continue yearly diabetic eye exam  #2  GERD controlled with pantoprazole .  Refill pantoprazole  for 1 year  #3 dyslipidemia.  Treated with Vytorin .  Patient also takes Vascepa .  Check lipid and CMP  #4 low libido.  Patient requesting testosterone  level.  This will be drawn.  If low , will need 1 repeat to confirm   Return in about 6 months (around 06/21/2024).    Wolm Scarlet, MD

## 2023-12-21 MED ORDER — EMPAGLIFLOZIN 10 MG PO TABS: 10.0000 mg | ORAL_TABLET | Freq: Every day | ORAL | 0 refills | Status: DC

## 2023-12-21 NOTE — Addendum Note (Signed)
 Addended by: METTA KRISTEN CROME on: 12/21/2023 08:29 AM   Modules accepted: Orders

## 2024-01-23 ENCOUNTER — Other Ambulatory Visit: Payer: Self-pay | Admitting: Family Medicine

## 2024-02-11 ENCOUNTER — Other Ambulatory Visit: Payer: Self-pay | Admitting: Family Medicine

## 2024-02-12 ENCOUNTER — Other Ambulatory Visit: Payer: Self-pay

## 2024-02-14 MED ORDER — EZETIMIBE-SIMVASTATIN 10-40 MG PO TABS: 1.0000 | ORAL_TABLET | Freq: Every day | ORAL | 1 refills | Status: DC

## 2024-02-18 ENCOUNTER — Other Ambulatory Visit: Payer: Self-pay | Admitting: Family Medicine

## 2024-02-28 ENCOUNTER — Ambulatory Visit: Attending: Cardiology | Admitting: Cardiology

## 2024-02-28 ENCOUNTER — Encounter: Payer: Self-pay | Admitting: Cardiology

## 2024-02-28 VITALS — BP 126/68 | HR 75 | Ht 74.0 in | Wt 213.6 lb

## 2024-02-28 DIAGNOSIS — I2510 Atherosclerotic heart disease of native coronary artery without angina pectoris: Secondary | ICD-10-CM | POA: Diagnosis not present

## 2024-02-28 DIAGNOSIS — I25119 Atherosclerotic heart disease of native coronary artery with unspecified angina pectoris: Secondary | ICD-10-CM

## 2024-02-28 DIAGNOSIS — I480 Paroxysmal atrial fibrillation: Secondary | ICD-10-CM | POA: Diagnosis not present

## 2024-02-28 DIAGNOSIS — E1169 Type 2 diabetes mellitus with other specified complication: Secondary | ICD-10-CM | POA: Diagnosis not present

## 2024-02-28 DIAGNOSIS — D6869 Other thrombophilia: Secondary | ICD-10-CM | POA: Diagnosis not present

## 2024-02-28 DIAGNOSIS — I10 Essential (primary) hypertension: Secondary | ICD-10-CM

## 2024-02-28 DIAGNOSIS — E785 Hyperlipidemia, unspecified: Secondary | ICD-10-CM

## 2024-02-28 MED ORDER — ROSUVASTATIN CALCIUM 10 MG PO TABS: 10.0000 mg | ORAL_TABLET | Freq: Every day | ORAL | 3 refills | Status: AC

## 2024-02-28 MED ORDER — EZETIMIBE 10 MG PO TABS: 10.0000 mg | ORAL_TABLET | Freq: Every day | ORAL | 3 refills | Status: AC

## 2024-02-28 MED ORDER — METOPROLOL SUCCINATE ER 25 MG PO TB24: 12.5000 mg | ORAL_TABLET | Freq: Every day | ORAL | 2 refills | Status: AC

## 2024-02-28 NOTE — Patient Instructions (Addendum)
 Medication Instructions:   DECREASE TOPROL  TO 12.5 MG  DAILY ( 1/2 TABLET OF 25 MG)    COMPLETE TAKING THE BOTTLE OF VYTORIN   THEN DISCONTINUE   START TAKING 10 MG Zetia  AND 10 MG ROSUVASTATIN  10 MG DAILY     THE EVENING YOU PLAN ON USING SILDENAFIL  THEN DO NOT TAKE YOUR METOPROLOL   OR LISINOPRIL  THE DAY OF.   Dr Anner will contact you in regards to you continuing Xarelto   once he speaks to [facility]  EP -providers  *If you need a refill on your cardiac medications before your next appointment, please call your pharmacy*   Lab Work: NOT NEEDED  If you have labs (blood work) drawn today and your tests are completely normal, you will receive your results only by: MyChart Message (if you have MyChart) OR A paper copy in the mail If you have any lab test that is abnormal or we need to change your treatment, we will call you to review the results.   Testing/Procedures: NOT NEEDED   Follow-Up: At [facility], you and your health needs are our priority.  As part of our continuing mission to provide you with exceptional heart care, we have created designated Provider Care Teams.  These Care Teams include your primary Cardiologist (physician) and Advanced Practice Providers (APPs -  Physician Assistants and Nurse Practitioners) who all work together to provide you with the care you need, when you need it.     Your next appointment:   6 month(s)  The format for your next appointment:   In Person  Provider:   Alm Anner, MD

## 2024-02-28 NOTE — Progress Notes (Signed)
 Cardiology Office Note:  .   Date:  03/03/2024  ID:  Michael Livingston, DOB 05-15-1960, MRN [id number] PCP: Micheal Wolm ORN, MD  [facility] Providers Cardiologist:  Alm Clay, MD     Chief Complaint  Patient presents with   Follow-up    92-month follow-up.  Wants to go ahead and establish with his new cardiologist.  Has medication questions.   Coronary Artery Disease    No angina or heart failure   Atrial Fibrillation    Status post ablation and has not had any breakthrough spells as far she knows.    Patient Profile: .     Michael Livingston is a 63 y.o. male with a PMH notable for an 5-month follow-up to reestablish with a new cardiologist, after Dr. Joesphine retirement. He has been a longtime patient of  Burchette, Wolm ORN, MD, who has been managing his noncardiac issues.  Cardiovascular PMH: CAD October 2005:overlapping stents to proximal LAD and mid LAD Relook cath October 2007: Patent stents; left dominant system with LCx and RI Relook cath August 2019: 4% ISR proximal LAD and mid LAD overlapping stents PAF (01/2017): DCCV 2018 and again in October 2020 04/2019: PVI (Afib Ablation) - Dr. Kelsie.     Michael Livingston was last seen by Dr. Burnard back in February 2025.  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  Was not aware of any breakthrough spells of A-fib.  No fluttering sensations.  No chest pain or dyspnea with rest or exertion.  Was on 2.5 lisinopril  and 25 mg Toprol  XL.  He was also walking Vytorin  10/40 mg plus OTC omega-3 fatty acids.  Subjective  Discussed the use of AI scribe software for clinical note transcription with the patient, who gave verbal consent to proceed.  History of Present Illness Michael Livingston is a 63 year old male with atrial fibrillation and coronary artery disease who presents for medication management and concerns about anticoagulation therapy.  He has a history of atrial fibrillation diagnosed in  2018, for which he was cardioverted and started on Xarelto . He is concerned about the risk of bleeding due to a fall from a roof four months ago while on Xarelto  and inquires about switching back to Plavix , which he was on prior to the diagnosis of atrial fibrillation. He has not experienced any breakthrough episodes of atrial fibrillation since undergoing an ablation in December 2020.  He has a history of coronary artery disease, having undergone a heart catheterization with stent placement in 2005 following a heart attack. A subsequent catheterization in 2007 showed open vessels, and a stress test in 2013 was normal. He is currently on metoprolol  25 mg and lisinopril  2.5 mg for blood pressure and heart rate management.  He has type 2 diabetes and is currently taking metformin  and Jardiance , both of which are relatively new additions to his regimen.  He has concerns about his libido and inquires about the potential benefits of switching to nebivolol. He is also interested in the effects of his current medications on his blood pressure and libido, particularly in relation to taking sildenafil .  He is currently taking Vytorin  (simvastatin  and ezetimibe ) for cholesterol management but reports joint pain and muscle aches, which he attributes to the medication. He has been taking half the prescribed dose and is interested in alternative options to manage his cholesterol while minimizing side effects.  No swelling in his legs or pain in his calves or thighs  when walking. He reports occasional muscle twitching in his calves, which he notices when putting on socks.  No melena, hematochezia, hematuria, epistaxis.  No easy bleeding but does bruise easily.     Objective   Current Meds CV Medications Sig   icosapent  Ethyl (VASCEPA ) 1 g capsule Take 1 capsule (1 g total) by mouth 2 (two) times daily.   JARDIANCE  10 MG TABS tablet TAKE 1 TABLET BY MOUTH DAILY BEFORE BREAKFAST.   lisinopril  (ZESTRIL ) 2.5 MG  tablet TAKE 1 TABLET BY MOUTH EVERY DAY   metFORMIN  (GLUCOPHAGE ) 500 MG tablet Take 2 tablets (1,000 mg total) by mouth 2 (two) times daily.   nitroGLYCERIN  (NITROSTAT ) 0.4 MG SL tablet Place 1 tablet (0.4 mg total) under the tongue every 5 (five) minutes as needed for chest pain.   sildenafil  (VIAGRA ) 100 MG tablet Take 1 tablet (100 mg total) by mouth daily as needed for erectile dysfunction.   XARELTO  20 MG TABS tablet TAKE 1 TABLET BY MOUTH DAILY WITH SUPPER.   []  ezetimibe -simvastatin  (VYTORIN ) 10-40 MG tablet Take 1 tablet by mouth daily.   []  metoprolol  succinate (TOPROL -XL) 25 MG 24 hr tablet TAKE 1 TABLET (25 MG TOTAL) BY MOUTH DAILY.    Non-CV Medications Sig   famotidine  (PEPCID ) 20 MG tablet TAKE 1 TABLET BY MOUTH EVERY DAY   methocarbamol  (ROBAXIN ) 500 MG tablet Take 1 tablet (500 mg total) by mouth every 8 (eight) hours as needed for muscle spasms.   Multiple Vitamins-Minerals (MULTIVITAMIN WITH MINERALS) tablet Take 1 tablet by mouth at bedtime.    pantoprazole  (PROTONIX ) 40 MG tablet TAKE 1 TABLET DAILY 30 TO  60 MINUTES BEFORE FIRST    MEAL OF THE DAY   PAZEO 0.7 % SOLN Place 1 drop into both eyes daily as needed (allergies).    trimethoprim -polymyxin b  (POLYTRIM ) ophthalmic solution Place 2 drops into the right eye every 4 (four) hours.    SH: He previously worked for [employer] and had to have a DOT physical with routine surveillance.  He now has his own roofing company.  Very concerned about potentially following  Studies Reviewed: SABRA   EKG Interpretation Date/Time:  Wednesday February 28 2024 15:47:17 EDT Ventricular Rate:  75 PR Interval:  154 QRS Duration:  100 QT Interval:  396 QTC Calculation: 442 R Axis:   -17  Text Interpretation: Normal sinus rhythm Normal ECG When compared with ECG of 28-Jun-2023 10:54, Nonspecific T wave abnormality no longer evident in Inferior leads Nonspecific T wave abnormality no longer evident in Anterior leads QT has lengthened Confirmed by  Anner Lenis ([id number]) on 02/28/2024 4:51:11 PM    Lab Results  Component Value Date   CHOL 117 12/20/2023   HDL 36.20 (L) 12/20/2023   LDLCALC 48 12/20/2023   LDLDIRECT 101.0 03/07/2022   TRIG 164.0 (H) 12/20/2023   CHOLHDL 3 12/20/2023   Lab Results  Component Value Date   HGBA1C 7.3 (H) 12/20/2023    Results  DIAGNOSTIC Cardiac Catheterization: Overlapping stents placed in proximal to mid LAD (02/2004) Cardiac Catheterization: Patent stents (02/2006) Cardiac Catheterization: Proximal-mid LAD overlapping stents with 20% ISR.  Normal IR and dominant LCx with nondominant RCA.  EF 55 to 60%.  (12/15/2017) -images personally reviewed      Myoview  Stress Test: Excellent Exercise Tolerance - 12:30 min without symptoms.  Max heart rate 150 bpm, 90% MPHR.  No EKG changes.  EF 45%.  NO ISCHEMIA/NO INFARCTION.  LOW RISK (11/03/2017)  Echocardiogram: Normal LV size and function.  Mild LVH.  EF 55 to 65% with no RWMA.  GR 2 DD.  Mild LA dilation.  (11/03/2017); images personally reviewed   Risk Assessment/Calculations:    CHA2DS2-VASc Score = 3   This indicates a 3.2% annual risk of stroke. The patient's score is based upon: CHF History: 0 HTN History: 1 Diabetes History: 1 Stroke History: 0 Vascular Disease History: 1 Age Score: 0 Gender Score: 0 He is status post ablation with no recurrence, was 5 years.        Physical Exam:   VS:  BP 126/68   Pulse 75   Ht 6' 2 (1.88 m)   Wt 213 lb 9.6 oz (96.9 kg)   SpO2 95%   BMI 27.42 kg/m    Wt Readings from Last 3 Encounters:  02/28/24 213 lb 9.6 oz (96.9 kg)  12/20/23 216 lb 14.4 oz (98.4 kg)  10/20/23 205 lb (93 kg)     GEN: Healthy-Appearing..   Well nourished, well groomed; in no acute distress;  NECK: No JVD; No carotid bruits CARDIAC: Normal S1, S2; RRR, with occasional ectopy.  Soft 1/6 SEM, but no other murmurs, rubs, gallops RESPIRATORY:  Clear to auscultation without rales, wheezing or rhonchi ; nonlabored, good  air movement. ABDOMEN: Soft, non-tender, non-distended EXTREMITIES:  No edema; No deformity     ASSESSMENT AND PLAN: .    Problem List Items Addressed This Visit       Cardiology Problems   Coronary artery disease involving native heart without angina pectoris - Primary (Chronic)   Atherosclerotic heart disease with previous stent placement. Currently on metoprolol  and lisinopril  for heart rate and blood pressure management. - Continue current low-dose lisinopril  2.5 mg daily for CAD, DM and A-fib benefit. - With resting bradycardia, will plan to wean off of beta-blocker-reduce to 12.5 mg daily with plans to potentially fully discontinue. - Evaluate the necessity of anticoagulation with Xarelto  in consultation with EP specialists. - Evaluate the necessity of anticoagulation with Xarelto  in consultation with EP specialists. => If we do come off of DOAC, would restart aspirin  81 mg daily      Relevant Medications   ezetimibe  (ZETIA ) 10 MG tablet   rosuvastatin (CRESTOR) 10 MG tablet   metoprolol  succinate (TOPROL -XL) 25 MG 24 hr tablet   Other Relevant Orders   EKG 12-Lead (Completed)   Hyperlipidemia associated with type 2 diabetes mellitus (HCC) (Chronic)   Hyperlipidemia managed with Vytorin  (ezetimibe  10 mg/ simvastatin  40 mg) with reported myalgias.  Rosuvastatin is four times as potent as simvastatin , allowing for lower dosing and reduced side effects. - Switch from Vytorin  to 10 mg rosuvastatin and 10 mg ezetimibe . - Discontinue Vytorin  after current supply is finished.  - Continue current diabetes medications, metformin  and Jardiance .-A1c 7.3      Relevant Medications   ezetimibe  (ZETIA ) 10 MG tablet   rosuvastatin (CRESTOR) 10 MG tablet   metoprolol  succinate (TOPROL -XL) 25 MG 24 hr tablet   Paroxysmal atrial fibrillation s/p ablation (Chronic)   Atrial fibrillation post-ablation with no recurrence in 5 years. Currently on Xarelto  for anticoagulation. 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 Xarelto  reduces stroke risk to less than 1% compared to 3.2% without anticoagulation. - Consult with EP specialists to evaluate the necessity of continuing Xarelto . - Consider discontinuing Xarelto  if no AFib recurrence is confirmed - Cut Toprol  dose in half due to bradycardia.      Relevant Medications   ezetimibe  (ZETIA ) 10 MG tablet  rosuvastatin (CRESTOR) 10 MG tablet   metoprolol  succinate (TOPROL -XL) 25 MG 24 hr tablet   Primary hypertension (Chronic)   Hypertension managed with metoprolol  and lisinopril . Discussion about potential impact on libido and the possibility of switching to nebivolol. - Continue low-dose lisinopril  2.5 Brabham daily - Cut Toprol  dose in half.-Reduce to 12.5 mg daily - Trial holding either metoprolol  or lisinopril  on days when taking sildenafil  to assess impact on blood pressure and libido. - Monitor blood pressure at home.      Relevant Medications   ezetimibe  (ZETIA ) 10 MG tablet   rosuvastatin (CRESTOR) 10 MG tablet   metoprolol  succinate (TOPROL -XL) 25 MG 24 hr tablet     Other   Secondary hypercoagulable state (Chronic)   Chest Vascor of 3 but he is status post PVI ablation with no recurrence over the last 5 years.  I question if he truly needs to be on long-term DOAC.  - Consult with EP specialists to evaluate the necessity of continuing Xarelto . - Consider discontinuing Xarelto  if no AFib recurrence is confirmed              Follow-Up: Return in about 6 months (around 08/28/2024) for Michael Livingston, Routine follow up with me.  I spent 56 minutes in the care of Hartley Urton today including reviewing labs (1 minute), reviewing studies (multiple Reports reviewed with the most recent Films reviewed similarly multiple echocardiograms with most recent echo Cardi images reviewed along with the most recent Myoview -12 minutes), face to face time discussing treatment options (24  minutes), reviewing records from recent clinic visits with Dr. Burnard as well as recent APP visits (6 minutes), 13 minutes dictating, and documenting in the encounter.      Signed, Alm MICAEL Clay, MD, MS Alm Clay, M.D., M.S. Interventional Cardiologist  [facility] Pager # [phone]

## 2024-03-03 ENCOUNTER — Encounter: Payer: Self-pay | Admitting: Cardiology

## 2024-03-03 DIAGNOSIS — I10 Essential (primary) hypertension: Secondary | ICD-10-CM | POA: Insufficient documentation

## 2024-03-03 NOTE — Assessment & Plan Note (Signed)
 Hypertension managed with metoprolol  and lisinopril . Discussion about potential impact on libido and the possibility of switching to nebivolol. - Continue low-dose lisinopril  2.5 Brabham daily - Cut Toprol  dose in half.-Reduce to 12.5 mg daily - Trial holding either metoprolol  or lisinopril  on days when taking sildenafil  to assess impact on blood pressure and libido. - Monitor blood pressure at home.

## 2024-03-03 NOTE — Assessment & Plan Note (Signed)
 Atherosclerotic heart disease with previous stent placement. Currently on metoprolol  and lisinopril  for heart rate and blood pressure management. - Continue current low-dose lisinopril  2.5 mg daily for CAD, DM and A-fib benefit. - With resting bradycardia, will plan to wean off of beta-blocker-reduce to 12.5 mg daily with plans to potentially fully discontinue. - Evaluate the necessity of anticoagulation with Xarelto  in consultation with EP specialists. - Evaluate the necessity of anticoagulation with Xarelto  in consultation with EP specialists. => If we do come off of DOAC, would restart aspirin  81 mg daily

## 2024-03-03 NOTE — Assessment & Plan Note (Addendum)
 Hyperlipidemia managed with Vytorin  (ezetimibe  10 mg/ simvastatin  40 mg) with reported myalgias.  Rosuvastatin is four times as potent as simvastatin , allowing for lower dosing and reduced side effects. - Switch from Vytorin  to 10 mg rosuvastatin and 10 mg ezetimibe . - Discontinue Vytorin  after current supply is finished.  - Continue current diabetes medications, metformin  and Jardiance .-A1c 7.3

## 2024-03-03 NOTE — Assessment & Plan Note (Signed)
 Chest Vascor of 3 but he is status post PVI ablation with no recurrence over the last 5 years.  I question if he truly needs to be on long-term DOAC.  - Consult with EP specialists to evaluate the necessity of continuing Xarelto . - Consider discontinuing Xarelto  if no AFib recurrence is confirmed

## 2024-03-03 NOTE — Assessment & Plan Note (Signed)
 Atrial fibrillation post-ablation with no recurrence in 5 years. Currently on Xarelto  for anticoagulation. 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 Xarelto  reduces stroke risk to less than 1% compared to 3.2% without anticoagulation. - Consult with EP specialists to evaluate the necessity of continuing Xarelto . - Consider discontinuing Xarelto  if no AFib recurrence is confirmed - Cut Toprol  dose in half due to bradycardia.

## 2024-03-25 ENCOUNTER — Encounter: Payer: Self-pay | Admitting: Family Medicine

## 2024-03-25 ENCOUNTER — Telehealth: Payer: Self-pay | Admitting: Family Medicine

## 2024-03-25 ENCOUNTER — Ambulatory Visit (INDEPENDENT_AMBULATORY_CARE_PROVIDER_SITE_OTHER): Admitting: Family Medicine

## 2024-03-25 VITALS — BP 128/72 | HR 79 | Temp 97.9°F | Wt 210.8 lb

## 2024-03-25 DIAGNOSIS — J209 Acute bronchitis, unspecified: Secondary | ICD-10-CM

## 2024-03-25 MED ORDER — GUAIFENESIN-CODEINE 100-10 MG/5ML PO SOLN: ORAL | 0 refills | Status: AC

## 2024-03-25 MED ORDER — PROMETHAZINE-CODEINE 6.25-10 MG/5ML PO SYRP: 5.0000 mL | ORAL_SOLUTION | Freq: Four times a day (QID) | ORAL | 0 refills | Status: DC | PRN

## 2024-03-25 NOTE — Progress Notes (Signed)
 Established Patient Office Visit  Subjective   Patient ID: Michael Livingston, male    DOB: 1960-11-06  Age: 63 y.o. MRN: [id number]  Chief Complaint  Patient presents with   Diarrhea   Cough   Hoarse         HPI   Michael Livingston is seen with acute illness.  He states about couple weeks ago he had severe diarrhea for about 30 hours.  That diarrhea eventually resolved and then last week he developed some hoarseness followed by cough.  Cough productive of dark green sputum past few days.  Cough has been severe at night.  No fever.  No dyspnea.  Denies any nausea or vomiting.  No sinusitis symptoms.  No significant headache.  Cough interfering with sleep.  He quit smoking 1989  Past Medical History:  Diagnosis Date   Allergy     Atrial fibrillation (HCC)    a. diagnosed in 01/2017 --> s/p DCCV. Started on Xarelto  for anticoagulation.    CAD (coronary artery disease)    a. s/p DES to LAD in 2005 b. patent by cath in 2007 with 30% LCx stenosis noted at that time c. normal NST in 05/2011   Chronic kidney disease    1 kidney stone   Diabetes mellitus without complication (HCC)    type 2, no meds needed   Hyperlipidemia    Hypertension    Myocardial infarction [facility])    Past Surgical History:  Procedure Laterality Date   ATRIAL FIBRILLATION ABLATION N/A 05/07/2019   Procedure: ATRIAL FIBRILLATION ABLATION;  Surgeon: Kelsie Agent, MD;  Location: [facility] INVASIVE CV LAB;  Service: Cardiovascular;  Laterality: N/A;   CARDIAC SURGERY  2005   stent   CORONARY STENT PLACEMENT     KNEE ARTHROSCOPY     left 2003, right 2005   LEFT HEART CATH AND CORONARY ANGIOGRAPHY N/A 12/15/2017   Procedure: LEFT HEART CATH AND CORONARY ANGIOGRAPHY;  Surgeon: Burnard Debby LABOR, MD;  Location: [facility] INVASIVE CV LAB;  Service: Cardiovascular;  Laterality: N/A;   SHOULDER SURGERY      reports that he quit smoking about 36 years ago. His smoking use included cigarettes. He started smoking about 44 years ago. He has a 8  pack-year smoking history. He has never used smokeless tobacco. He reports that he does not drink alcohol and does not use drugs. family history includes Diabetes in his father and mother; Heart disease in his father and paternal grandfather. Allergies  Allergen Reactions   Aleve [Naproxen Sodium] Swelling and Other (See Comments)    Mouth swells   Oxycodone Other (See Comments)    Rendered the patient unable to fall asleep AND he almost passed out   Sulfa Antibiotics Hives   Trazodone  And Nefazodone Other (See Comments)    Seeing colored lights   Vicodin [Hydrocodone-Acetaminophen ] Other (See Comments)    Rendered the patient unable to fall asleep AND he almost passed out    Review of Systems  Constitutional:  Negative for chills and fever.  Respiratory:  Positive for cough and sputum production. Negative for hemoptysis and shortness of breath.       Objective:     BP 128/72   Pulse 79   Temp 97.9 F (36.6 C) (Oral)   Wt 210 lb 12.8 oz (95.6 kg)   SpO2 95%   BMI 27.07 kg/m  BP Readings from Last 3 Encounters:  03/25/24 128/72  02/28/24 126/68  12/20/23 120/66   Wt Readings from Last 3 Encounters:  03/25/24 210 lb 12.8 oz (95.6 kg)  02/28/24 213 lb 9.6 oz (96.9 kg)  12/20/23 216 lb 14.4 oz (98.4 kg)      Physical Exam Vitals reviewed.  Constitutional:      General: He is not in acute distress.    Appearance: He is not ill-appearing.  Cardiovascular:     Rate and Rhythm: Normal rate and regular rhythm.  Pulmonary:     Effort: Pulmonary effort is normal.     Breath sounds: Normal breath sounds. No wheezing or rales.  Neurological:     Mental Status: He is alert.      No results found for any visits on 03/25/24.    The ASCVD Risk score (Arnett DK, et al., 2019) failed to calculate for the following reasons:   Risk score cannot be calculated because patient has a medical history suggesting prior/existing ASCVD    Assessment & Plan:   Acute  bronchitis.  Suspect viral origin.  Patient has had severe cough at night and requesting cough syrup.  He has apparently tolerated codeine cough syrup in the past.  Wrote for guaifenesin  with codeine 1 to 2 teaspoons every 6 hours as needed for severe cough.  He is aware of potential for sedation with this.  Follow-up immediately for any fever or any persistent or worsening cough  Wolm Scarlet, MD

## 2024-03-25 NOTE — Telephone Encounter (Signed)
 Copied from CRM [id number]. Topic: Clinical - Prescription Issue >> Mar 25, 2024  4:17 PM Lonell PEDLAR wrote: Reason for CRM: Patient called, advised that rx for promethazine-codeine (PHENERGAN WITH CODEINE) 6.25-10 MG/5ML syrup is not available at pharmacy, would like alternate rx to be sent.  Rx came through faxes and is being routed to provider.

## 2024-03-26 ENCOUNTER — Encounter: Payer: Self-pay | Admitting: Family Medicine

## 2024-03-26 NOTE — Telephone Encounter (Signed)
 Spoke with patient.  He took some Benadryl this morning.  He had no fever today.  Slept better last night with less cough.  He has no rash whatsoever on his trunk or extremities.  No shortness of breath.  We recommend if rash not clearing the next day or so to stop by let us  take a look at the rash to further assess  Michael Livingston Scarlet MD [facility]

## 2024-03-26 NOTE — Telephone Encounter (Signed)
 Patient informed.

## 2024-04-23 ENCOUNTER — Ambulatory Visit (INDEPENDENT_AMBULATORY_CARE_PROVIDER_SITE_OTHER): Admitting: Otolaryngology

## 2024-05-22 ENCOUNTER — Other Ambulatory Visit: Payer: Self-pay | Admitting: Family Medicine
